# Patient Record
Sex: Male | Born: 1950 | Race: White | Hispanic: No | Marital: Married | Smoking: Never smoker
Health system: Southern US, Community
[De-identification: ages and names within clinical notes are randomized; demographics above are authoritative.]

## PROBLEM LIST (undated history)

## (undated) DIAGNOSIS — F329 Major depressive disorder, single episode, unspecified: Secondary | ICD-10-CM

## (undated) DIAGNOSIS — E785 Hyperlipidemia, unspecified: Secondary | ICD-10-CM

## (undated) DIAGNOSIS — F909 Attention-deficit hyperactivity disorder, unspecified type: Secondary | ICD-10-CM

## (undated) DIAGNOSIS — I2510 Atherosclerotic heart disease of native coronary artery without angina pectoris: Secondary | ICD-10-CM

## (undated) DIAGNOSIS — I209 Angina pectoris, unspecified: Secondary | ICD-10-CM

## (undated) DIAGNOSIS — G250 Essential tremor: Secondary | ICD-10-CM

## (undated) DIAGNOSIS — Z87828 Personal history of other (healed) physical injury and trauma: Secondary | ICD-10-CM

## (undated) HISTORY — PX: KNEE SURGERY: SHX244

## (undated) HISTORY — DX: Angina pectoris, unspecified: I20.9

## (undated) HISTORY — PX: ANKLE SURGERY: SHX546

## (undated) HISTORY — PX: TONSILLECTOMY: SUR1361

---

## 2009-05-09 ENCOUNTER — Encounter: Admission: RE | Admit: 2009-05-09 | Discharge: 2009-05-09 | Payer: Self-pay | Admitting: Family Medicine

## 2010-09-06 DIAGNOSIS — Z87828 Personal history of other (healed) physical injury and trauma: Secondary | ICD-10-CM | POA: Insufficient documentation

## 2010-09-06 HISTORY — DX: Personal history of other (healed) physical injury and trauma: Z87.828

## 2011-02-18 ENCOUNTER — Emergency Department (HOSPITAL_COMMUNITY): Payer: BC Managed Care – PPO

## 2011-02-18 ENCOUNTER — Inpatient Hospital Stay (HOSPITAL_COMMUNITY)
Admission: EM | Admit: 2011-02-18 | Discharge: 2011-02-22 | DRG: 223 | Disposition: A | Discharge status: Home / Self Care | Payer: BC Managed Care – PPO | Attending: General Surgery | Admitting: General Surgery

## 2011-02-18 DIAGNOSIS — S02401A Maxillary fracture, unspecified, initial encounter for closed fracture: Secondary | ICD-10-CM | POA: Diagnosis present

## 2011-02-18 DIAGNOSIS — H8110 Benign paroxysmal vertigo, unspecified ear: Secondary | ICD-10-CM | POA: Diagnosis not present

## 2011-02-18 DIAGNOSIS — F329 Major depressive disorder, single episode, unspecified: Secondary | ICD-10-CM | POA: Diagnosis present

## 2011-02-18 DIAGNOSIS — R259 Unspecified abnormal involuntary movements: Secondary | ICD-10-CM | POA: Diagnosis present

## 2011-02-18 DIAGNOSIS — F3289 Other specified depressive episodes: Secondary | ICD-10-CM | POA: Diagnosis present

## 2011-02-18 DIAGNOSIS — R339 Retention of urine, unspecified: Secondary | ICD-10-CM | POA: Diagnosis not present

## 2011-02-18 DIAGNOSIS — Y999 Unspecified external cause status: Secondary | ICD-10-CM

## 2011-02-18 DIAGNOSIS — S02109A Fracture of base of skull, unspecified side, initial encounter for closed fracture: Secondary | ICD-10-CM | POA: Diagnosis present

## 2011-02-18 DIAGNOSIS — Y9355 Activity, bike riding: Secondary | ICD-10-CM

## 2011-02-18 DIAGNOSIS — S52023A Displaced fracture of olecranon process without intraarticular extension of unspecified ulna, initial encounter for closed fracture: Principal | ICD-10-CM | POA: Diagnosis present

## 2011-02-18 DIAGNOSIS — S065XAA Traumatic subdural hemorrhage with loss of consciousness status unknown, initial encounter: Secondary | ICD-10-CM | POA: Diagnosis present

## 2011-02-18 DIAGNOSIS — Y92410 Unspecified street and highway as the place of occurrence of the external cause: Secondary | ICD-10-CM

## 2011-02-18 DIAGNOSIS — IMO0002 Reserved for concepts with insufficient information to code with codable children: Secondary | ICD-10-CM | POA: Diagnosis present

## 2011-02-18 DIAGNOSIS — S02400A Malar fracture unspecified, initial encounter for closed fracture: Secondary | ICD-10-CM | POA: Diagnosis present

## 2011-02-18 LAB — URINALYSIS, ROUTINE W REFLEX MICROSCOPIC
Bilirubin Urine: NEGATIVE
Glucose, UA: NEGATIVE mg/dL
Hgb urine dipstick: NEGATIVE
Ketones, ur: 15 mg/dL — AB
Leukocytes, UA: NEGATIVE
Nitrite: NEGATIVE
Protein, ur: NEGATIVE mg/dL
Specific Gravity, Urine: 1.015 (ref 1.005–1.030)
Urobilinogen, UA: 0.2 mg/dL (ref 0.0–1.0)
pH: 7 (ref 5.0–8.0)

## 2011-02-18 LAB — CBC
HCT: 43.9 % (ref 39.0–52.0)
Hemoglobin: 15.7 g/dL (ref 13.0–17.0)
MCH: 31.9 pg (ref 26.0–34.0)
MCHC: 35.8 g/dL (ref 30.0–36.0)
MCV: 89.2 fL (ref 78.0–100.0)
Platelets: 218 10*3/uL (ref 150–400)
RBC: 4.92 MIL/uL (ref 4.22–5.81)
RDW: 12.7 % (ref 11.5–15.5)
WBC: 10.6 10*3/uL — ABNORMAL HIGH (ref 4.0–10.5)

## 2011-02-18 LAB — PROTIME-INR
INR: 0.96 (ref 0.00–1.49)
Prothrombin Time: 13 seconds (ref 11.6–15.2)

## 2011-02-18 LAB — MRSA PCR SCREENING: MRSA by PCR: NEGATIVE

## 2011-02-18 LAB — DIFFERENTIAL
Basophils Absolute: 0 10*3/uL (ref 0.0–0.1)
Basophils Relative: 0 % (ref 0–1)
Eosinophils Absolute: 0.3 10*3/uL (ref 0.0–0.7)
Eosinophils Relative: 3 % (ref 0–5)
Lymphocytes Relative: 28 % (ref 12–46)
Lymphs Abs: 2.9 10*3/uL (ref 0.7–4.0)
Monocytes Absolute: 0.9 10*3/uL (ref 0.1–1.0)
Monocytes Relative: 9 % (ref 3–12)
Neutro Abs: 6.4 10*3/uL (ref 1.7–7.7)
Neutrophils Relative %: 61 % (ref 43–77)

## 2011-02-18 LAB — RAPID URINE DRUG SCREEN, HOSP PERFORMED
Amphetamines: POSITIVE — AB
Barbiturates: NOT DETECTED
Benzodiazepines: NOT DETECTED
Cocaine: NOT DETECTED
Opiates: NOT DETECTED
Tetrahydrocannabinol: NOT DETECTED

## 2011-02-18 LAB — APTT: aPTT: 28 seconds (ref 24–37)

## 2011-02-18 LAB — BASIC METABOLIC PANEL
BUN: 23 mg/dL (ref 6–23)
CO2: 24 mEq/L (ref 19–32)
Calcium: 9.8 mg/dL (ref 8.4–10.5)
Chloride: 100 mEq/L (ref 96–112)
Creatinine, Ser: 0.9 mg/dL (ref 0.4–1.5)
GFR calc Af Amer: >60 mL/min (ref >60)
GFR calc non Af Amer: >60 mL/min (ref >60)
Glucose, Bld: 126 mg/dL — ABNORMAL HIGH (ref 70–99)
Potassium: 4 mEq/L (ref 3.5–5.1)
Sodium: 136 mEq/L (ref 135–145)

## 2011-02-18 LAB — ETHANOL: Alcohol, Ethyl (B): <11 mg/dL — ABNORMAL HIGH (ref 0–10)

## 2011-02-18 MED ORDER — IOHEXOL 300 MG/ML  SOLN
80.0000 mL | Freq: Once | INTRAMUSCULAR | Status: AC | PRN
  Administered 2011-02-18: 80 mL via INTRAVENOUS

## 2011-02-19 ENCOUNTER — Inpatient Hospital Stay (HOSPITAL_COMMUNITY): Payer: BC Managed Care – PPO

## 2011-02-19 LAB — BASIC METABOLIC PANEL
BUN: 26 mg/dL — ABNORMAL HIGH (ref 6–23)
CO2: 26 mEq/L (ref 19–32)
Calcium: 8.5 mg/dL (ref 8.4–10.5)
Chloride: 103 mEq/L (ref 96–112)
Creatinine, Ser: 0.75 mg/dL (ref 0.50–1.35)
GFR calc Af Amer: >60 mL/min (ref >60)
GFR calc non Af Amer: >60 mL/min (ref >60)
Glucose, Bld: 115 mg/dL — ABNORMAL HIGH (ref 70–99)
Potassium: 4.1 mEq/L (ref 3.5–5.1)
Sodium: 138 mEq/L (ref 135–145)

## 2011-02-19 LAB — CBC
HCT: 42.4 % (ref 39.0–52.0)
Hemoglobin: 14.8 g/dL (ref 13.0–17.0)
MCH: 31.5 pg (ref 26.0–34.0)
MCHC: 34.9 g/dL (ref 30.0–36.0)
MCV: 90.2 fL (ref 78.0–100.0)
Platelets: 194 10*3/uL (ref 150–400)
RBC: 4.7 MIL/uL (ref 4.22–5.81)
RDW: 12.7 % (ref 11.5–15.5)
WBC: 13.8 10*3/uL — ABNORMAL HIGH (ref 4.0–10.5)
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
## 2011-02-20 LAB — BASIC METABOLIC PANEL
BUN: 20 mg/dL (ref 6–23)
CO2: 27 mEq/L (ref 19–32)
Calcium: 8 mg/dL — ABNORMAL LOW (ref 8.4–10.5)
Chloride: 100 mEq/L (ref 96–112)
Creatinine, Ser: 0.78 mg/dL (ref 0.50–1.35)
GFR calc Af Amer: >60 mL/min (ref >60)
GFR calc non Af Amer: >60 mL/min (ref >60)
Glucose, Bld: 110 mg/dL — ABNORMAL HIGH (ref 70–99)
Potassium: 3.5 mEq/L (ref 3.5–5.1)
Sodium: 135 mEq/L (ref 135–145)

## 2011-02-20 LAB — CBC
HCT: 40.2 % (ref 39.0–52.0)
Hemoglobin: 14 g/dL (ref 13.0–17.0)
MCH: 31.9 pg (ref 26.0–34.0)
MCHC: 34.8 g/dL (ref 30.0–36.0)
MCV: 91.6 fL (ref 78.0–100.0)
Platelets: 158 10*3/uL (ref 150–400)
RBC: 4.39 MIL/uL (ref 4.22–5.81)
RDW: 12.9 % (ref 11.5–15.5)
WBC: 10.3 10*3/uL (ref 4.0–10.5)
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
## 2011-03-01 ENCOUNTER — Ambulatory Visit: Payer: BC Managed Care – PPO | Attending: Orthopedic Surgery | Admitting: Occupational Therapy

## 2011-03-01 DIAGNOSIS — Z5189 Encounter for other specified aftercare: Secondary | ICD-10-CM | POA: Insufficient documentation

## 2011-03-01 DIAGNOSIS — M25629 Stiffness of unspecified elbow, not elsewhere classified: Secondary | ICD-10-CM | POA: Insufficient documentation

## 2011-03-01 DIAGNOSIS — R279 Unspecified lack of coordination: Secondary | ICD-10-CM | POA: Insufficient documentation

## 2011-03-01 DIAGNOSIS — M6281 Muscle weakness (generalized): Secondary | ICD-10-CM | POA: Insufficient documentation

## 2011-03-05 ENCOUNTER — Ambulatory Visit: Payer: BC Managed Care – PPO | Admitting: Physical Therapy

## 2011-03-09 ENCOUNTER — Ambulatory Visit: Payer: BC Managed Care – PPO | Attending: Physician Assistant

## 2011-03-09 DIAGNOSIS — M6281 Muscle weakness (generalized): Secondary | ICD-10-CM | POA: Insufficient documentation

## 2011-03-09 DIAGNOSIS — Z5189 Encounter for other specified aftercare: Secondary | ICD-10-CM | POA: Insufficient documentation

## 2011-03-09 DIAGNOSIS — M25629 Stiffness of unspecified elbow, not elsewhere classified: Secondary | ICD-10-CM | POA: Insufficient documentation

## 2011-03-09 DIAGNOSIS — R279 Unspecified lack of coordination: Secondary | ICD-10-CM | POA: Insufficient documentation
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
## 2011-03-17 ENCOUNTER — Ambulatory Visit: Payer: BC Managed Care – PPO | Admitting: Physical Therapy
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
## 2011-03-19 ENCOUNTER — Ambulatory Visit: Payer: BC Managed Care – PPO | Admitting: *Deleted

## 2011-03-22 ENCOUNTER — Ambulatory Visit: Payer: BC Managed Care – PPO | Admitting: Physical Therapy

## 2011-03-24 ENCOUNTER — Ambulatory Visit: Payer: BC Managed Care – PPO | Admitting: Occupational Therapy

## 2011-03-25 ENCOUNTER — Encounter: Payer: BC Managed Care – PPO | Admitting: Physical Therapy
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
## 2011-03-29 ENCOUNTER — Ambulatory Visit: Payer: BC Managed Care – PPO | Admitting: Physical Therapy

## 2011-03-29 ENCOUNTER — Ambulatory Visit: Payer: BC Managed Care – PPO | Admitting: Occupational Therapy

## 2011-04-01 ENCOUNTER — Ambulatory Visit: Payer: BC Managed Care – PPO | Admitting: Occupational Therapy

## 2011-04-01 ENCOUNTER — Ambulatory Visit: Payer: BC Managed Care – PPO | Admitting: Physical Therapy

## 2011-04-05 ENCOUNTER — Encounter: Payer: BC Managed Care – PPO | Admitting: Occupational Therapy

## 2011-04-05 ENCOUNTER — Encounter: Payer: BC Managed Care – PPO | Admitting: Physical Therapy

## 2011-04-08 ENCOUNTER — Encounter: Payer: BC Managed Care – PPO | Admitting: Occupational Therapy

## 2011-04-08 ENCOUNTER — Encounter: Payer: BC Managed Care – PPO | Admitting: Physical Therapy

## 2011-04-14 ENCOUNTER — Encounter: Payer: BC Managed Care – PPO | Admitting: Occupational Therapy

## 2011-04-14 ENCOUNTER — Encounter: Payer: BC Managed Care – PPO | Admitting: Physical Therapy

## 2011-04-16 ENCOUNTER — Encounter: Payer: BC Managed Care – PPO | Admitting: Physical Therapy

## 2011-04-16 ENCOUNTER — Encounter: Payer: BC Managed Care – PPO | Admitting: Occupational Therapy

## 2013-08-21 ENCOUNTER — Ambulatory Visit (INDEPENDENT_AMBULATORY_CARE_PROVIDER_SITE_OTHER): Payer: BC Managed Care – PPO | Admitting: Urology

## 2013-08-21 DIAGNOSIS — N529 Male erectile dysfunction, unspecified: Secondary | ICD-10-CM

## 2013-08-21 DIAGNOSIS — E291 Testicular hypofunction: Secondary | ICD-10-CM

## 2015-07-24 DIAGNOSIS — E785 Hyperlipidemia, unspecified: Secondary | ICD-10-CM | POA: Insufficient documentation

## 2015-07-24 HISTORY — DX: Hyperlipidemia, unspecified: E78.5

## 2015-09-09 ENCOUNTER — Ambulatory Visit (INDEPENDENT_AMBULATORY_CARE_PROVIDER_SITE_OTHER): Payer: BC Managed Care – PPO | Admitting: Urology

## 2015-09-09 DIAGNOSIS — N5201 Erectile dysfunction due to arterial insufficiency: Secondary | ICD-10-CM

## 2015-09-09 DIAGNOSIS — E291 Testicular hypofunction: Secondary | ICD-10-CM | POA: Diagnosis not present

## 2015-09-09 DIAGNOSIS — Z125 Encounter for screening for malignant neoplasm of prostate: Secondary | ICD-10-CM

## 2016-01-22 DIAGNOSIS — F411 Generalized anxiety disorder: Secondary | ICD-10-CM | POA: Insufficient documentation

## 2016-01-22 HISTORY — DX: Generalized anxiety disorder: F41.1

## 2016-06-30 DIAGNOSIS — R413 Other amnesia: Secondary | ICD-10-CM | POA: Insufficient documentation

## 2016-06-30 DIAGNOSIS — F0781 Postconcussional syndrome: Secondary | ICD-10-CM | POA: Insufficient documentation

## 2016-06-30 DIAGNOSIS — G44309 Post-traumatic headache, unspecified, not intractable: Secondary | ICD-10-CM

## 2016-06-30 DIAGNOSIS — F329 Major depressive disorder, single episode, unspecified: Secondary | ICD-10-CM | POA: Insufficient documentation

## 2016-06-30 DIAGNOSIS — F32A Depression, unspecified: Secondary | ICD-10-CM

## 2016-06-30 DIAGNOSIS — Z87828 Personal history of other (healed) physical injury and trauma: Secondary | ICD-10-CM | POA: Insufficient documentation

## 2016-06-30 DIAGNOSIS — G250 Essential tremor: Secondary | ICD-10-CM | POA: Insufficient documentation

## 2016-06-30 DIAGNOSIS — F341 Dysthymic disorder: Secondary | ICD-10-CM | POA: Insufficient documentation

## 2016-06-30 HISTORY — DX: Postconcussional syndrome: F07.81

## 2016-06-30 HISTORY — DX: Dysthymic disorder: F34.1

## 2016-06-30 HISTORY — DX: Depression, unspecified: F32.A

## 2016-06-30 HISTORY — DX: Essential tremor: G25.0

## 2016-06-30 HISTORY — DX: Other amnesia: R41.3

## 2016-06-30 HISTORY — DX: Post-traumatic headache, unspecified, not intractable: G44.309

## 2018-01-31 ENCOUNTER — Ambulatory Visit: Payer: Medicare Other | Admitting: Urology

## 2018-01-31 DIAGNOSIS — N5201 Erectile dysfunction due to arterial insufficiency: Secondary | ICD-10-CM

## 2018-01-31 DIAGNOSIS — E291 Testicular hypofunction: Secondary | ICD-10-CM | POA: Diagnosis not present

## 2018-02-27 ENCOUNTER — Ambulatory Visit: Payer: Medicare Other | Admitting: Cardiovascular Disease

## 2018-02-28 ENCOUNTER — Other Ambulatory Visit: Payer: Self-pay | Admitting: Cardiology

## 2018-02-28 ENCOUNTER — Ambulatory Visit
Admission: RE | Admit: 2018-02-28 | Discharge: 2018-02-28 | Disposition: A | Discharge status: Home / Self Care | Payer: Medicare Other | Source: Ambulatory Visit | Attending: Cardiology | Admitting: Cardiology

## 2018-02-28 ENCOUNTER — Encounter: Payer: Self-pay | Admitting: Cardiology

## 2018-02-28 DIAGNOSIS — R079 Chest pain, unspecified: Secondary | ICD-10-CM

## 2018-02-28 NOTE — H&P (Signed)
Zachary Ponce    Date of visit:  02/28/2018 DOB:  1951/04/21    Age:  67 yrs. Medical record number:  [id number]     Account number:  [id number] Primary Care Provider: Ernestine Conrad ____________________________ CURRENT DIAGNOSES  1. Chest pain, unspecified  2. Abnormal result of cardiovascular function study, unspecified  3. Encounter for preprocedural cardiovascular examination  4. Hyperlipidemia ____________________________ ALLERGIES  No Known Drug Allergies ____________________________ MEDICATIONS  1. AndroGel 20.25 mg/1.25 gram (1.62 %) transdermal gel pump, Daily  2. bupropion HCl XL 300 mg 24 hr tablet, extended release, 1 p.o. daily  3. escitalopram 5 mg tablet, 1 p.o. daily  4. Malarone 250 mg-100 mg tablet, Take as directed PRN  5. propranolol ER 120 mg capsule,24 hr,extended release, 1 p.o. daily  6. Vyvanse 30 mg capsule, 1 p.o. daily ____________________________ HISTORY OF PRESENT ILLNESS This very nice 67 year old male is seen at the request of Dr. Kenton Kingfisher for evaluation of fatigue and dyspnea. The patient has a history of ADHD and some mild depression as well as hyperlipidemia. Most recently noticed that he had some dyspnea when walking up inclines or having to walk the golf course to the point where he had to start using the cart. He may have had some vague chest tightness. He spends his time between [place] state as well as here and saw his primary care physician out there who recommended that he have a stress test. He has noted these vague symptoms over the past several months. He does not have symptoms at rest and denies edema. He has no PND, orthopnea, syncope, palpitations, or claudication.  He had a stress test done on 6/25 and only walked 6 minutes and 45 seconds and stopped as of dyspnea and had typical angina. He had 1 mm of ST depression laterally associated with T-wave changes in recovery. He had a clinically and EKG positive test for angina and ischemia  and cardiac catheterization was recommended. ____________________________ PAST HISTORY  Past Medical Illnesses:  hyperlipidemia, anxiety, colon polyps, ADHD, essential tremor;  Cardiovascular Illnesses:  no previous history of cardiac disease;  Infectious Diseases:  no previous history of significant infectious diseases;  Surgical Procedures:  knee surgery, rt, tonsillectomy, rt ear drum implant, rt ankle surgery;  Trauma History:  no previous history of significant trauma;  NYHA Classification:  I;  Canadian Angina Classification:  Class 0: Asymptomatic;  Cardiology Procedures-Invasive:  no previous interventional or invasive cardiology procedures;  Cardiology Procedures-Noninvasive:  no previous non-invasive cardiovascular testing;  Peripheral Vascular Procedures:  no previous invasive peripheral vascular procedures.;  LVEF not documented,   ____________________________ CARDIO-PULMONARY TEST DATES EKG Date:  02/21/2018;   ____________________________ FAMILY HISTORY Brother -- Diabetes mellitus, Brother alive with problem Father -- Father dead Mother -- Mother dead, COPD, Malignant neoplasm of lung ____________________________ SOCIAL HISTORY Alcohol Use:  does not use alcohol;  Smoking:  never smoked;  Diet:  regular diet;  Lifestyle:  married;  Exercise:  golf;  Occupation:  retired;  Residence:  lives with wife;   ____________________________ REVIEW OF SYSTEMS General:  denies recent weight change, fatique or change in exercise tolerance.  Integumentary:no rashes or new skin lesions. Eyes: wears eye glasses/contact lenses, denies diplopia, glaucoma or visual field defects. Ears, Nose, Throat, Mouth:  denies any hearing loss, epistaxis, hoarseness or difficulty speaking. Respiratory: mild dyspnea with exertion Cardiovascular:  please review HPI Abdominal: denies dyspepsia, GI bleeding, constipation, or diarrhea Genitourinary-Male: hesitancy, nocturia  Musculoskeletal:  mild chronic back pain  Neurological:  denies headaches, stroke, or TIA Psychiatric:  depression Hematological/Immunologic:  denies any food allergies, bleeding disorders. ____________________________ PHYSICAL EXAMINATION VITAL SIGNS  Blood Pressure:  120/80 Standing, Left arm, regular cuff   Pulse:  80/min. Weight:  178.00 lbs. Height:  70.00"BMI: 25  Constitutional:  pleasant white male in no acute distress Skin:  warm and dry to touch, no apparent skin lesions, or masses noted. Head:  normocephalic, normal hair pattern, no masses or tenderness Eyes:  EOMS Intact, PERRLA, C and S clear, Funduscopic exam not done. ENT:  ears, nose and throat reveal no gross abnormalities.  Dentition good. Neck:  supple, without massess. No JVD, thyromegaly or carotid bruits. Carotid upstroke normal. Chest:  normal symmetry, clear to auscultation. Cardiac:  regular rhythm, normal S1 and S2, No S3 or S4, no murmurs, gallops or rubs detected. Abdomen:  abdomen soft,non-tender, no masses, no hepatospenomegaly, or aneurysm noted Peripheral Pulses:  the femoral,dorsalis pedis, and posterior tibial pulses are full and equal bilaterally with no bruits auscultated. Extremities & Back:  no deformities, clubbing, cyanosis, erythema or edema observed. Normal muscle strength and tone. Neurological:  no gross motor or sensory deficits noted, affect appropriate, oriented x3. ____________________________ MOST RECENT LIPID PANEL 01/18/18  CHOL TOTL 209 mg/dl, LDL 138 NM, HDL 32 mg/dl, TRIGLYCER 193 mg/dl, ALT 21 u/l, ALK PHOS 121 u/l, CHOL/HDL 6.5 (Calc), AST 23 u/l and VLDL 39 ____________________________ IMPRESSIONS/PLAN  He had a stress test done on 6/25 and only walked 6 minutes and 45 seconds and stopped as of dyspnea and had typical angina. He had 1 mm of ST depression laterally associated with T-wave changes in recovery. He had a clinically and EKG positive test for angina and ischemia and cardiac catheterization was recommended.  1.  Chest discomfort and dyspnea suggestive of angina with an abnormal cardiovascular stress test 2. Hyperlipidemia 3. History of ADHD and depression 4. Essential tremor  Recommendations:  The patient has a suggestive clinical history of angina with an abnormal exercise treadmill test only one minute into Bruce stage III. I recommended he undergo cardiac catheterization. Cardiac catheterization was discussed with the patient and wife including risks of myocardial infarction, death, stroke, bleeding, arrhythmia, dye allergy, or renal insufficiency. He understands and is willing to proceed. Possibility of percutaneous intervention at the same setting was also discussed with the patient including risks.  ____________________________ TODAYS ORDERS  1. Draw PT/INR: Today  2. Comprehensive Metabolic Panel: Today  3. Complete Blood Count: Today  4. PTT: Today  5. Chest X-ray PA/Lat: today                       ____________________________ Cardiology Physician:  Kerry Hough MD [facility]

## 2018-03-02 ENCOUNTER — Other Ambulatory Visit: Payer: Self-pay | Admitting: Cardiology

## 2018-03-03 ENCOUNTER — Encounter (HOSPITAL_COMMUNITY)
Admission: AD | Disposition: A | Discharge status: Home Health | Payer: Self-pay | Source: Home / Self Care | Attending: Cardiology

## 2018-03-03 ENCOUNTER — Encounter (HOSPITAL_COMMUNITY): Payer: Self-pay | Admitting: Cardiology

## 2018-03-03 ENCOUNTER — Other Ambulatory Visit: Payer: Self-pay | Admitting: *Deleted

## 2018-03-03 ENCOUNTER — Other Ambulatory Visit: Payer: Self-pay

## 2018-03-03 ENCOUNTER — Inpatient Hospital Stay (HOSPITAL_COMMUNITY)
Admission: AD | Admit: 2018-03-03 | Discharge: 2018-03-16 | DRG: 234 | Disposition: A | Discharge status: Home Health | Payer: Medicare Other | Attending: Cardiology | Admitting: Cardiology

## 2018-03-03 DIAGNOSIS — F329 Major depressive disorder, single episode, unspecified: Secondary | ICD-10-CM | POA: Diagnosis present

## 2018-03-03 DIAGNOSIS — I480 Paroxysmal atrial fibrillation: Secondary | ICD-10-CM | POA: Diagnosis not present

## 2018-03-03 DIAGNOSIS — I25119 Atherosclerotic heart disease of native coronary artery with unspecified angina pectoris: Secondary | ICD-10-CM | POA: Diagnosis not present

## 2018-03-03 DIAGNOSIS — Z8249 Family history of ischemic heart disease and other diseases of the circulatory system: Secondary | ICD-10-CM

## 2018-03-03 DIAGNOSIS — G4700 Insomnia, unspecified: Secondary | ICD-10-CM | POA: Diagnosis not present

## 2018-03-03 DIAGNOSIS — I308 Other forms of acute pericarditis: Secondary | ICD-10-CM | POA: Diagnosis not present

## 2018-03-03 DIAGNOSIS — E8770 Fluid overload, unspecified: Secondary | ICD-10-CM | POA: Diagnosis not present

## 2018-03-03 DIAGNOSIS — D62 Acute posthemorrhagic anemia: Secondary | ICD-10-CM | POA: Diagnosis not present

## 2018-03-03 DIAGNOSIS — I319 Disease of pericardium, unspecified: Secondary | ICD-10-CM | POA: Diagnosis not present

## 2018-03-03 DIAGNOSIS — I25118 Atherosclerotic heart disease of native coronary artery with other forms of angina pectoris: Secondary | ICD-10-CM | POA: Diagnosis not present

## 2018-03-03 DIAGNOSIS — I440 Atrioventricular block, first degree: Secondary | ICD-10-CM | POA: Diagnosis present

## 2018-03-03 DIAGNOSIS — I9789 Other postprocedural complications and disorders of the circulatory system, not elsewhere classified: Secondary | ICD-10-CM | POA: Diagnosis not present

## 2018-03-03 DIAGNOSIS — Z9889 Other specified postprocedural states: Secondary | ICD-10-CM

## 2018-03-03 DIAGNOSIS — Z01810 Encounter for preprocedural cardiovascular examination: Secondary | ICD-10-CM | POA: Diagnosis not present

## 2018-03-03 DIAGNOSIS — I739 Peripheral vascular disease, unspecified: Secondary | ICD-10-CM | POA: Diagnosis present

## 2018-03-03 DIAGNOSIS — I951 Orthostatic hypotension: Secondary | ICD-10-CM | POA: Diagnosis not present

## 2018-03-03 DIAGNOSIS — I208 Other forms of angina pectoris: Secondary | ICD-10-CM | POA: Diagnosis not present

## 2018-03-03 DIAGNOSIS — E785 Hyperlipidemia, unspecified: Secondary | ICD-10-CM

## 2018-03-03 DIAGNOSIS — I779 Disorder of arteries and arterioles, unspecified: Secondary | ICD-10-CM

## 2018-03-03 DIAGNOSIS — I2510 Atherosclerotic heart disease of native coronary artery without angina pectoris: Secondary | ICD-10-CM

## 2018-03-03 DIAGNOSIS — I25110 Atherosclerotic heart disease of native coronary artery with unstable angina pectoris: Principal | ICD-10-CM | POA: Diagnosis present

## 2018-03-03 DIAGNOSIS — Z951 Presence of aortocoronary bypass graft: Secondary | ICD-10-CM | POA: Diagnosis not present

## 2018-03-03 DIAGNOSIS — R0609 Other forms of dyspnea: Secondary | ICD-10-CM | POA: Diagnosis present

## 2018-03-03 DIAGNOSIS — I4891 Unspecified atrial fibrillation: Secondary | ICD-10-CM | POA: Diagnosis not present

## 2018-03-03 DIAGNOSIS — I10 Essential (primary) hypertension: Secondary | ICD-10-CM | POA: Diagnosis present

## 2018-03-03 DIAGNOSIS — I6523 Occlusion and stenosis of bilateral carotid arteries: Secondary | ICD-10-CM | POA: Diagnosis present

## 2018-03-03 DIAGNOSIS — F909 Attention-deficit hyperactivity disorder, unspecified type: Secondary | ICD-10-CM | POA: Diagnosis present

## 2018-03-03 DIAGNOSIS — I300 Acute nonspecific idiopathic pericarditis: Secondary | ICD-10-CM | POA: Diagnosis not present

## 2018-03-03 DIAGNOSIS — I2584 Coronary atherosclerosis due to calcified coronary lesion: Secondary | ICD-10-CM | POA: Diagnosis present

## 2018-03-03 DIAGNOSIS — I361 Nonrheumatic tricuspid (valve) insufficiency: Secondary | ICD-10-CM | POA: Diagnosis not present

## 2018-03-03 DIAGNOSIS — R9439 Abnormal result of other cardiovascular function study: Secondary | ICD-10-CM | POA: Diagnosis present

## 2018-03-03 DIAGNOSIS — I209 Angina pectoris, unspecified: Secondary | ICD-10-CM | POA: Diagnosis present

## 2018-03-03 HISTORY — DX: Major depressive disorder, single episode, unspecified: F32.9

## 2018-03-03 HISTORY — DX: Attention-deficit hyperactivity disorder, unspecified type: F90.9

## 2018-03-03 HISTORY — PX: LEFT HEART CATH AND CORONARY ANGIOGRAPHY: CATH118249

## 2018-03-03 HISTORY — DX: Hyperlipidemia, unspecified: E78.5

## 2018-03-03 HISTORY — DX: Atherosclerotic heart disease of native coronary artery without angina pectoris: I25.10

## 2018-03-03 HISTORY — DX: Essential tremor: G25.0

## 2018-03-03 HISTORY — DX: Personal history of other (healed) physical injury and trauma: Z87.828

## 2018-03-03 LAB — CBC
HCT: 43.6 % (ref 39.0–52.0)
Hemoglobin: 13.9 g/dL (ref 13.0–17.0)
MCH: 31.1 pg (ref 26.0–34.0)
MCHC: 31.9 g/dL (ref 30.0–36.0)
MCV: 97.5 fL (ref 78.0–100.0)
Platelets: 177 10*3/uL (ref 150–400)
RBC: 4.47 MIL/uL (ref 4.22–5.81)
RDW: 12.8 % (ref 11.5–15.5)
WBC: 4.6 10*3/uL (ref 4.0–10.5)

## 2018-03-03 SURGERY — LEFT HEART CATH AND CORONARY ANGIOGRAPHY
Anesthesia: LOCAL

## 2018-03-03 MED ORDER — VERAPAMIL HCL 2.5 MG/ML IV SOLN
INTRAVENOUS | Status: DC | PRN
  Administered 2018-03-03: 13:00:00 via INTRA_ARTERIAL

## 2018-03-03 MED ORDER — SODIUM CHLORIDE 0.9 % IV SOLN: 250.0000 mL | INTRAVENOUS | Status: DC | PRN

## 2018-03-03 MED ORDER — SODIUM CHLORIDE 0.9% FLUSH: 3.0000 mL | INTRAVENOUS | Status: DC | PRN

## 2018-03-03 MED ORDER — ISOSORBIDE MONONITRATE ER 30 MG PO TB24
30.0000 mg | ORAL_TABLET | Freq: Every day | ORAL | Status: DC
  Administered 2018-03-03 – 2018-03-07 (×5): 30 mg via ORAL
  Filled 2018-03-03 (×5): qty 1

## 2018-03-03 MED ORDER — ESCITALOPRAM OXALATE 10 MG PO TABS
5.0000 mg | ORAL_TABLET | Freq: Every day | ORAL | Status: DC
  Administered 2018-03-03 – 2018-03-16 (×13): 5 mg via ORAL
  Filled 2018-03-03 (×13): qty 1

## 2018-03-03 MED ORDER — MIDAZOLAM HCL 2 MG/2ML IJ SOLN
INTRAMUSCULAR | Status: AC
  Filled 2018-03-03: qty 2

## 2018-03-03 MED ORDER — ATORVASTATIN CALCIUM 80 MG PO TABS
80.0000 mg | ORAL_TABLET | Freq: Every day | ORAL | Status: DC
  Administered 2018-03-03 – 2018-03-11 (×8): 80 mg via ORAL
  Filled 2018-03-03 (×8): qty 1

## 2018-03-03 MED ORDER — LIDOCAINE HCL (PF) 1 % IJ SOLN
INTRAMUSCULAR | Status: AC
  Filled 2018-03-03: qty 30

## 2018-03-03 MED ORDER — BUPROPION HCL ER (XL) 300 MG PO TB24
300.0000 mg | ORAL_TABLET | Freq: Every day | ORAL | Status: DC
  Administered 2018-03-03 – 2018-03-16 (×13): 300 mg via ORAL
  Filled 2018-03-03 (×6): qty 2
  Filled 2018-03-03: qty 1
  Filled 2018-03-03: qty 2
  Filled 2018-03-03 (×2): qty 1
  Filled 2018-03-03 (×2): qty 2
  Filled 2018-03-03: qty 1
  Filled 2018-03-03: qty 2

## 2018-03-03 MED ORDER — ACETAMINOPHEN 325 MG PO TABS: 650.0000 mg | ORAL_TABLET | ORAL | Status: DC | PRN

## 2018-03-03 MED ORDER — VERAPAMIL HCL 2.5 MG/ML IV SOLN
INTRAVENOUS | Status: AC
  Filled 2018-03-03: qty 2

## 2018-03-03 MED ORDER — HEPARIN SODIUM (PORCINE) 1000 UNIT/ML IJ SOLN
INTRAMUSCULAR | Status: AC
  Filled 2018-03-03: qty 1

## 2018-03-03 MED ORDER — ASPIRIN 81 MG PO CHEW: 81.0000 mg | CHEWABLE_TABLET | ORAL | Status: DC

## 2018-03-03 MED ORDER — DIAZEPAM 5 MG PO TABS: 5.0000 mg | ORAL_TABLET | Freq: Four times a day (QID) | ORAL | Status: DC | PRN

## 2018-03-03 MED ORDER — ASPIRIN 81 MG PO CHEW
81.0000 mg | CHEWABLE_TABLET | Freq: Every day | ORAL | Status: DC
  Administered 2018-03-04 – 2018-03-07 (×4): 81 mg via ORAL
  Filled 2018-03-03 (×4): qty 1

## 2018-03-03 MED ORDER — LIDOCAINE HCL (PF) 1 % IJ SOLN
INTRAMUSCULAR | Status: DC | PRN
  Administered 2018-03-03: 2 mL

## 2018-03-03 MED ORDER — SODIUM CHLORIDE 0.9 % WEIGHT BASED INFUSION: 1.0000 mL/kg/h | INTRAVENOUS | Status: DC

## 2018-03-03 MED ORDER — SODIUM CHLORIDE 0.9% FLUSH: 3.0000 mL | Freq: Two times a day (BID) | INTRAVENOUS | Status: DC

## 2018-03-03 MED ORDER — HEPARIN (PORCINE) IN NACL 100-0.45 UNIT/ML-% IJ SOLN
1050.0000 [IU]/h | INTRAMUSCULAR | Status: DC
  Administered 2018-03-03 – 2018-03-04 (×2): 1150 [IU]/h via INTRAVENOUS
  Administered 2018-03-05 – 2018-03-07 (×3): 1050 [IU]/h via INTRAVENOUS
  Filled 2018-03-03 (×5): qty 250

## 2018-03-03 MED ORDER — FENTANYL CITRATE (PF) 100 MCG/2ML IJ SOLN
INTRAMUSCULAR | Status: AC
  Filled 2018-03-03: qty 2

## 2018-03-03 MED ORDER — SODIUM CHLORIDE 0.9 % IV SOLN
INTRAVENOUS | Status: DC
  Administered 2018-03-03: 18:00:00 via INTRAVENOUS

## 2018-03-03 MED ORDER — FENTANYL CITRATE (PF) 100 MCG/2ML IJ SOLN
INTRAMUSCULAR | Status: DC | PRN
  Administered 2018-03-03: 25 ug via INTRAVENOUS

## 2018-03-03 MED ORDER — HEPARIN (PORCINE) IN NACL 2-0.9 UNITS/ML
INTRAMUSCULAR | Status: AC | PRN
  Administered 2018-03-03 (×2): 500 mL via INTRA_ARTERIAL

## 2018-03-03 MED ORDER — SODIUM CHLORIDE 0.9 % IV SOLN
250.0000 mL | INTRAVENOUS | Status: DC | PRN
  Administered 2018-03-04: 250 mL via INTRAVENOUS

## 2018-03-03 MED ORDER — SODIUM CHLORIDE 0.9 % WEIGHT BASED INFUSION
3.0000 mL/kg/h | INTRAVENOUS | Status: DC
  Administered 2018-03-03: 3 mL/kg/h via INTRAVENOUS

## 2018-03-03 MED ORDER — HEPARIN (PORCINE) IN NACL 1000-0.9 UT/500ML-% IV SOLN
INTRAVENOUS | Status: AC
  Filled 2018-03-03: qty 1000

## 2018-03-03 MED ORDER — HEPARIN SODIUM (PORCINE) 1000 UNIT/ML IJ SOLN
INTRAMUSCULAR | Status: DC | PRN
  Administered 2018-03-03: 4000 [IU] via INTRAVENOUS

## 2018-03-03 MED ORDER — LISDEXAMFETAMINE DIMESYLATE 30 MG PO CAPS
30.0000 mg | ORAL_CAPSULE | Freq: Every day | ORAL | Status: DC
  Administered 2018-03-04 – 2018-03-16 (×11): 30 mg via ORAL
  Filled 2018-03-03 (×12): qty 1

## 2018-03-03 MED ORDER — MIDAZOLAM HCL 2 MG/2ML IJ SOLN
INTRAMUSCULAR | Status: DC | PRN
  Administered 2018-03-03: 2 mg via INTRAVENOUS

## 2018-03-03 MED ORDER — ONDANSETRON HCL 4 MG/2ML IJ SOLN
4.0000 mg | Freq: Four times a day (QID) | INTRAMUSCULAR | Status: DC | PRN
  Administered 2018-03-08: 4 mg via INTRAVENOUS

## 2018-03-03 MED ORDER — SODIUM CHLORIDE 0.9% FLUSH
3.0000 mL | Freq: Two times a day (BID) | INTRAVENOUS | Status: DC
  Administered 2018-03-04 – 2018-03-07 (×5): 3 mL via INTRAVENOUS

## 2018-03-03 MED ORDER — PROPRANOLOL HCL ER 120 MG PO CP24
120.0000 mg | ORAL_CAPSULE | Freq: Every day | ORAL | Status: DC
  Administered 2018-03-03 – 2018-03-07 (×5): 120 mg via ORAL
  Filled 2018-03-03 (×6): qty 1

## 2018-03-03 MED ORDER — IOHEXOL 350 MG/ML SOLN
INTRAVENOUS | Status: DC | PRN
  Administered 2018-03-03: 110 mL via INTRA_ARTERIAL

## 2018-03-03 SURGICAL SUPPLY — 13 items
CATH INFINITI 5FR ANG PIGTAIL (CATHETERS) ×2 IMPLANT
CATH INFINITI JR4 5F (CATHETERS) ×2 IMPLANT
CATH OPTITORQUE TIG 4.0 5F (CATHETERS) ×2 IMPLANT
DEVICE RAD COMP TR BAND LRG (VASCULAR PRODUCTS) ×2 IMPLANT
GLIDESHEATH SLEND SS 6F .021 (SHEATH) ×2 IMPLANT
GUIDEWIRE INQWIRE 1.5J.035X260 (WIRE) ×1 IMPLANT
INQWIRE 1.5J .035X260CM (WIRE) ×2
KIT HEART LEFT (KITS) ×2 IMPLANT
PACK CARDIAC CATHETERIZATION (CUSTOM PROCEDURE TRAY) ×2 IMPLANT
SYR MEDRAD MARK V 150ML (SYRINGE) ×2 IMPLANT
TRANSDUCER W/STOPCOCK (MISCELLANEOUS) ×2 IMPLANT
TUBING CIL FLEX 10 FLL-RA (TUBING) ×2 IMPLANT
VALVE MANIFOLD 3 PORT W/RA/ON (MISCELLANEOUS) ×2 IMPLANT

## 2018-03-03 NOTE — Progress Notes (Signed)
ANTICOAGULATION CONSULT NOTE - Initial Consult  Pharmacy Consult for Heparin  Indication:  No Known Allergies  Patient Measurements: Height: 5\' 10"  (177.8 cm) Weight: 178 lb (80.7 kg) IBW/kg (Calculated) : 73 Heparin Dosing Weight: 80.7 kg  Vital Signs: Temp: 97.6 F (36.4 C) (06/28 0837) Temp Source: Oral (06/28 0837) BP: 163/74 (06/28 1334) Pulse Rate: 52 (06/28 1334)  Labs: No results for input(s): HGB, HCT, PLT, APTT, LABPROT, INR, HEPARINUNFRC, HEPRLOWMOCWT, CREATININE, CKTOTAL, CKMB, TROPONINI in the last 72 hours.  CrCl cannot be calculated (Patient's most recent lab result is older than the maximum 21 days allowed.).   Medical History: No past medical history on file.  Medications:  Scheduled:  . [START ON 03/04/2018] aspirin  81 mg Oral Daily  . atorvastatin  80 mg Oral q1800  . buPROPion  300 mg Oral Daily  . escitalopram  5 mg Oral Daily  . isosorbide mononitrate  30 mg Oral Daily  . [START ON 03/04/2018] lisdexamfetamine  30 mg Oral Daily  . propranolol ER  120 mg Oral Daily  . sodium chloride flush  3 mL Intravenous Q12H    Assessment:  67 y.o male with chest pain. PMH of  HLD, ADHD, anxiety.  Here today for cardiac cath. Now s/p cath, pharmacy consulted to start IV heparin 8 hours after sheath out.  Sheath out at 13:10, no bleeding or hematoma noted.   No CBC done.     Goal of Therapy:  Heparin level 0.3-0.7 units/ml Monitor platelets by anticoagulation protocol: Yes   Plan:  Check CBC now prior to start of IV heparin  At 21:15 tonight, start IV hepairn (no bolus) at  Check 6 hour heparin level Daily Heparin level and CBC Monitor for s/s/ of bleeding   Thank you for allowing pharmacy to be part of this patients care team. Noah Delaineuth Kachina Niederer, RPh Clinical Pharmacist Please check AMION for all [facility] numbers 03/03/2018,2:32 PM

## 2018-03-03 NOTE — H&P (Signed)
Update H&P: Agree with planned cath per Dr. York Spanielilley's note on Mr. Andy. I have reviewed the risks, indications, and alternatives to cardiac catheterization, possible angioplasty, and stenting with the patient. Risks include but are not limited to bleeding, infection, vascular injury, stroke, myocardial infection, arrhythmia, kidney injury, radiation-related injury in the case of prolonged fluoroscopy use, emergency cardiac surgery, and death. The patient understands the risks of serious complication is 1-2 in 1000 with diagnostic cardiac cath and 1-2% or less with angioplasty/stenting.  Nicki Guadalajarahomas Graceland Wachter, MD  03/03/2018  12:19 PM
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
## 2018-03-03 NOTE — Progress Notes (Signed)
Subjective:  Patient was seen by me with fatigue and angina.  He had a positive treadmill test a few days ago.  He had an episode of chest discomfort while walking up a hill yesterday while walking his dogs. Cardiac cath reviewed by me personally today shows distal left main disease a very tight proximal LAD stenosis and moderate circumflex disease ostial RCA disease.  He is admitted for consideration of bypass grafting.  Objective:  Vital Signs in the last 24 hours: BP (!) 163/74   Pulse (!) 52   Temp 97.6 F (36.4 C) (Oral)   Resp 15   Ht 5\' 10"  (1.778 m)   Wt 80.7 kg (178 lb)   SpO2 100%   BMI 25.54 kg/m   Physical Exam:  Lungs:  Clear  Cardiac:  Regular rhythm, normal S1 and S2, no S3 Extremities:  Radial cath site currently stable  Intake/Output from previous day: No intake/output data recorded. Weight Filed Weights   03/03/18 0837  Weight: 80.7 kg (178 lb)   Assessment/Plan:  1.  Significant left main and three-vessel disease with lower limits of normal LV function 2.  Hyperlipidemia 3.  History of ADHD  Recommendations:  He will have a cardiac surgery consultation.  I appreciate Dr. Landry DykeKelly's help today.  Discussed with patient.     [place], Jr.  MD [facility]  03/03/2018, 3:03 PM

## 2018-03-04 ENCOUNTER — Inpatient Hospital Stay (HOSPITAL_COMMUNITY): Payer: Medicare Other

## 2018-03-04 DIAGNOSIS — I361 Nonrheumatic tricuspid (valve) insufficiency: Secondary | ICD-10-CM

## 2018-03-04 DIAGNOSIS — I208 Other forms of angina pectoris: Secondary | ICD-10-CM

## 2018-03-04 LAB — URINALYSIS, ROUTINE W REFLEX MICROSCOPIC
Bilirubin Urine: NEGATIVE
Glucose, UA: NEGATIVE mg/dL
Hgb urine dipstick: NEGATIVE
Ketones, ur: NEGATIVE mg/dL
Leukocytes, UA: NEGATIVE
Nitrite: NEGATIVE
Protein, ur: NEGATIVE mg/dL
Specific Gravity, Urine: 1.014 (ref 1.005–1.030)
pH: 6 (ref 5.0–8.0)

## 2018-03-04 LAB — HEPATIC FUNCTION PANEL
ALT: 18 U/L (ref 0–44)
AST: 18 U/L (ref 15–41)
Albumin: 3.1 g/dL — ABNORMAL LOW (ref 3.5–5.0)
Alkaline Phosphatase: 82 U/L (ref 38–126)
Bilirubin, Direct: 0.1 mg/dL (ref 0.0–0.2)
Indirect Bilirubin: 0.8 mg/dL (ref 0.3–0.9)
Total Bilirubin: 0.9 mg/dL (ref 0.3–1.2)
Total Protein: 5.4 g/dL — ABNORMAL LOW (ref 6.5–8.1)

## 2018-03-04 LAB — BASIC METABOLIC PANEL
Anion gap: 4 — ABNORMAL LOW (ref 5–15)
BUN: 13 mg/dL (ref 8–23)
CO2: 29 mmol/L (ref 22–32)
Calcium: 8.4 mg/dL — ABNORMAL LOW (ref 8.9–10.3)
Chloride: 106 mmol/L (ref 98–111)
Creatinine, Ser: 0.95 mg/dL (ref 0.61–1.24)
GFR calc Af Amer: >60 mL/min (ref >60)
GFR calc non Af Amer: >60 mL/min (ref >60)
Glucose, Bld: 102 mg/dL — ABNORMAL HIGH (ref 70–99)
Potassium: 4.7 mmol/L (ref 3.5–5.1)
Sodium: 139 mmol/L (ref 135–145)

## 2018-03-04 LAB — CBC
HCT: 40.8 % (ref 39.0–52.0)
Hemoglobin: 13.2 g/dL (ref 13.0–17.0)
MCH: 31.4 pg (ref 26.0–34.0)
MCHC: 32.4 g/dL (ref 30.0–36.0)
MCV: 96.9 fL (ref 78.0–100.0)
Platelets: 168 10*3/uL (ref 150–400)
RBC: 4.21 MIL/uL — ABNORMAL LOW (ref 4.22–5.81)
RDW: 12.6 % (ref 11.5–15.5)
WBC: 7 10*3/uL (ref 4.0–10.5)

## 2018-03-04 LAB — SURGICAL PCR SCREEN
MRSA, PCR: NEGATIVE
Staphylococcus aureus: NEGATIVE

## 2018-03-04 LAB — HEPARIN LEVEL (UNFRACTIONATED)
Heparin Unfractionated: 0.47 IU/mL (ref 0.30–0.70)
Heparin Unfractionated: 0.75 IU/mL — ABNORMAL HIGH (ref 0.30–0.70)

## 2018-03-04 LAB — PROTIME-INR
INR: 1.07
Prothrombin Time: 13.8 seconds (ref 11.4–15.2)

## 2018-03-04 LAB — ECHOCARDIOGRAM COMPLETE
Height: 70 in
Weight: 2825.6 oz

## 2018-03-04 LAB — TSH: TSH: 0.853 u[IU]/mL (ref 0.350–4.500)

## 2018-03-04 MED ORDER — LISINOPRIL 2.5 MG PO TABS
2.5000 mg | ORAL_TABLET | Freq: Every day | ORAL | Status: DC
  Administered 2018-03-04 – 2018-03-07 (×4): 2.5 mg via ORAL
  Filled 2018-03-04 (×4): qty 1

## 2018-03-04 NOTE — Progress Notes (Signed)
ANTICOAGULATION CONSULT NOTE - Follow Up Consult  Pharmacy Consult for heparin Indication: CAD awaiting CABG  Labs: Recent Labs    03/03/18 1543 03/04/18 0509  HGB 13.9 13.2  HCT 43.6 40.8  PLT 177 168  LABPROT  --  13.8  INR  --  1.07  HEPARINUNFRC  --  0.47  CREATININE  --  0.95    Assessment/Plan:  67yo male therapeutic on heparin after started post-cath. Will continue gtt at current rate and confirm stable with additional level.   Vernard GamblesVeronda Andreas Sobolewski, PharmD, BCPS  03/04/2018,6:44 AM

## 2018-03-04 NOTE — Plan of Care (Signed)
  Problem: Education: Goal: Knowledge of General Education information will improve Outcome: Completed/Met   Problem: Activity: Goal: Risk for activity intolerance will decrease Outcome: Completed/Met   Problem: Nutrition: Goal: Adequate nutrition will be maintained Outcome: Completed/Met   Problem: Pain Managment: Goal: General experience of comfort will improve Outcome: Completed/Met

## 2018-03-04 NOTE — Progress Notes (Signed)
Progress Note  Patient Name: [place] Date of Encounter: 03/04/2018  Primary Cardiologist: Donnie Ahoilley   Subjective   No complaints  Inpatient Medications    Scheduled Meds: . aspirin  81 mg Oral Daily  . atorvastatin  80 mg Oral q1800  . buPROPion  300 mg Oral Daily  . escitalopram  5 mg Oral Daily  . isosorbide mononitrate  30 mg Oral Daily  . lisdexamfetamine  30 mg Oral Daily  . propranolol ER  120 mg Oral Daily  . sodium chloride flush  3 mL Intravenous Q12H   Continuous Infusions: . sodium chloride 100 mL/hr at 03/03/18 1759  . sodium chloride 250 mL (03/04/18 0917)  . heparin 1,150 Units/hr (03/03/18 2118)   PRN Meds: sodium chloride, acetaminophen, diazepam, ondansetron (ZOFRAN) IV, sodium chloride flush   Vital Signs    Vitals:   03/03/18 1900 03/03/18 2148 03/04/18 0500 03/04/18 0915  BP: 124/67 122/70 109/62 (!) 117/59  Pulse: 62 (!) 56 (!) 53 60  Resp:   15   Temp:  97.8 F (36.6 C) 97.7 F (36.5 C)   TempSrc:  Oral Oral   SpO2: 98% 97% 98%   Weight:   176 lb 9.6 oz (80.1 kg)   Height:        Intake/Output Summary (Last 24 hours) at 03/04/2018 1016 Last data filed at 03/03/2018 2147 Gross per 24 hour  Intake 538.26 ml  Output 350 ml  Net 188.26 ml   Filed Weights   03/03/18 0837 03/04/18 0500  Weight: 178 lb (80.7 kg) 176 lb 9.6 oz (80.1 kg)    Telemetry    SR and sinus brady - Personally Reviewed  ECG    na  Physical Exam   GEN: No acute distress.   Neck: No JVD Cardiac: RRR, no murmurs, rubs, or gallops.  Respiratory: Clear to auscultation bilaterally. GI: Soft, nontender, non-distended  MS: No edema; No deformity. Neuro:  Nonfocal  Psych: Normal affect   Labs    Chemistry Recent Labs  Lab 03/04/18 0509  NA 139  K 4.7  CL 106  CO2 29  GLUCOSE 102*  BUN 13  CREATININE 0.95  CALCIUM 8.4*  PROT 5.4*  ALBUMIN 3.1*  AST 18  ALT 18  ALKPHOS 82  BILITOT 0.9  GFRNONAA >60  GFRAA >60  ANIONGAP 4*      Hematology Recent Labs  Lab 03/03/18 1543 03/04/18 0509  WBC 4.6 7.0  RBC 4.47 4.21*  HGB 13.9 13.2  HCT 43.6 40.8  MCV 97.5 96.9  MCH 31.1 31.4  MCHC 31.9 32.4  RDW 12.8 12.6  PLT 177 168    Cardiac EnzymesNo results for input(s): TROPONINI in the last 168 hours. No results for input(s): TROPIPOC in the last 168 hours.   BNPNo results for input(s): BNP, PROBNP in the last 168 hours.   DDimer No results for input(s): DDIMER in the last 168 hours.   Radiology    No results found.  Cardiac Studies    Patient Profile     67 y.o. male history of HL presented for outatient cath for chest and and SOB, cath showed severe multivessel disease including LM disease, plans for CABG next week.   Assessment & Plan    1. CAD - recent symptoms of chest pain and SOB - cath this admi with ostial RCA 75%, mid 80%, RPDA 70%. LM 75%. prox LAD 95%. LVEF 50-55% by LV gram - CT surgery consulted, plans for CABG next  week - echo pending.  - medical therapy with ASA, atorva 80, hep gtt, imdur 30, propanolol 120. Start low dose ACE-I lisinopril 2.5mg  daily.   For questions or updates, please contact [facility] Please consult [email or web address] for contact info under Cardiology/STEMI.      Joanie Coddington, MD  03/04/2018, 10:16 AM

## 2018-03-04 NOTE — Progress Notes (Signed)
  Echocardiogram 2D Echocardiogram has been performed.  [place] T [place] 03/04/2018, 4:21 PM

## 2018-03-05 ENCOUNTER — Other Ambulatory Visit (HOSPITAL_COMMUNITY): Payer: Medicare Other

## 2018-03-05 DIAGNOSIS — I25119 Atherosclerotic heart disease of native coronary artery with unspecified angina pectoris: Secondary | ICD-10-CM

## 2018-03-05 LAB — BASIC METABOLIC PANEL
Anion gap: 6 (ref 5–15)
BUN: 13 mg/dL (ref 8–23)
CO2: 28 mmol/L (ref 22–32)
Calcium: 8.6 mg/dL — ABNORMAL LOW (ref 8.9–10.3)
Chloride: 105 mmol/L (ref 98–111)
Creatinine, Ser: 0.91 mg/dL (ref 0.61–1.24)
GFR calc Af Amer: >60 mL/min (ref >60)
GFR calc non Af Amer: >60 mL/min (ref >60)
Glucose, Bld: 101 mg/dL — ABNORMAL HIGH (ref 70–99)
Potassium: 3.8 mmol/L (ref 3.5–5.1)
Sodium: 139 mmol/L (ref 135–145)

## 2018-03-05 LAB — CBC
HCT: 40.7 % (ref 39.0–52.0)
Hemoglobin: 13.5 g/dL (ref 13.0–17.0)
MCH: 31.6 pg (ref 26.0–34.0)
MCHC: 33.2 g/dL (ref 30.0–36.0)
MCV: 95.3 fL (ref 78.0–100.0)
Platelets: 164 10*3/uL (ref 150–400)
RBC: 4.27 MIL/uL (ref 4.22–5.81)
RDW: 12.7 % (ref 11.5–15.5)
WBC: 6 10*3/uL (ref 4.0–10.5)

## 2018-03-05 LAB — HEPARIN LEVEL (UNFRACTIONATED)
Heparin Unfractionated: 0.9 IU/mL — ABNORMAL HIGH (ref 0.30–0.70)
Heparin Unfractionated: 0.66 IU/mL (ref 0.30–0.70)

## 2018-03-05 NOTE — Plan of Care (Signed)
  Problem: Health Behavior/Discharge Planning: Goal: Ability to manage health-related needs will improve Outcome: Progressing   Problem: Coping: Goal: Level of anxiety will decrease Outcome: Progressing   

## 2018-03-05 NOTE — Progress Notes (Signed)
Progress Note  Patient Name: [place] Date of Encounter: 03/05/2018  Primary Cardiologist: Donnie Ahoilley  Subjective   No complaints  Inpatient Medications    Scheduled Meds: . aspirin  81 mg Oral Daily  . atorvastatin  80 mg Oral q1800  . buPROPion  300 mg Oral Daily  . escitalopram  5 mg Oral Daily  . isosorbide mononitrate  30 mg Oral Daily  . lisdexamfetamine  30 mg Oral Daily  . lisinopril  2.5 mg Oral Daily  . propranolol ER  120 mg Oral Daily  . sodium chloride flush  3 mL Intravenous Q12H   Continuous Infusions: . sodium chloride 100 mL/hr at 03/03/18 1759  . sodium chloride 10 mL/hr at 03/05/18 0604  . heparin 1,150 Units/hr (03/05/18 0604)   PRN Meds: sodium chloride, acetaminophen, diazepam, ondansetron (ZOFRAN) IV, sodium chloride flush   Vital Signs    Vitals:   03/04/18 1108 03/04/18 1425 03/04/18 2124 03/05/18 0557  BP: 112/67 119/72 118/64 126/67  Pulse:  (!) 55 (!) 57 (!) 53  Resp:      Temp:  97.7 F (36.5 C) 97.7 F (36.5 C) 98 F (36.7 C)  TempSrc:  Oral Oral Oral  SpO2:  98% 96% 96%  Weight:    174 lb 6.4 oz (79.1 kg)  Height:        Intake/Output Summary (Last 24 hours) at 03/05/2018 0908 Last data filed at 03/05/2018 0604 Gross per 24 hour  Intake 855.87 ml  Output 200 ml  Net 655.87 ml   Filed Weights   03/03/18 0837 03/04/18 0500 03/05/18 0557  Weight: 178 lb (80.7 kg) 176 lb 9.6 oz (80.1 kg) 174 lb 6.4 oz (79.1 kg)    Telemetry    NSR and sinus brady - Personally Reviewed  ECG    na  Physical Exam   GEN: No acute distress.   Neck: No JVD Cardiac: RRR, no murmurs, rubs, or gallops.  Respiratory: Clear to auscultation bilaterally. GI: Soft, nontender, non-distended  MS: No edema; No deformity. Neuro:  Nonfocal  Psych: Normal affect   Labs    Chemistry Recent Labs  Lab 03/04/18 0509 03/05/18 0748  NA 139 139  K 4.7 3.8  CL 106 105  CO2 29 28  GLUCOSE 102* 101*  BUN 13 13  CREATININE 0.95 0.91    CALCIUM 8.4* 8.6*  PROT 5.4*  --   ALBUMIN 3.1*  --   AST 18  --   ALT 18  --   ALKPHOS 82  --   BILITOT 0.9  --   GFRNONAA >60 >60  GFRAA >60 >60  ANIONGAP 4* 6     Hematology Recent Labs  Lab 03/03/18 1543 03/04/18 0509 03/05/18 0748  WBC 4.6 7.0 6.0  RBC 4.47 4.21* 4.27  HGB 13.9 13.2 13.5  HCT 43.6 40.8 40.7  MCV 97.5 96.9 95.3  MCH 31.1 31.4 31.6  MCHC 31.9 32.4 33.2  RDW 12.8 12.6 12.7  PLT 177 168 164    Cardiac EnzymesNo results for input(s): TROPONINI in the last 168 hours. No results for input(s): TROPIPOC in the last 168 hours.   BNPNo results for input(s): BNP, PROBNP in the last 168 hours.   DDimer No results for input(s): DDIMER in the last 168 hours.   Radiology    No results found.  Cardiac Studies     Patient Profile     67 y.o. male history of HL presented for outatient cath for chest and  and SOB, cath showed severe multivessel disease including LM disease, plans for CABG next week.     Assessment & Plan    1. CAD - recent symptoms of chest pain and SOB - cath this admi with ostial RCA 75%, mid 80%, RPDA 70%. LM 75%. prox LAD 95%. LVEF 50-55% by LV gram - CT surgery consulted, plans for CABG next week - echo LVEF 55-60%  - medical therapy with ASA, atorva 80, hep gtt, imdur 30, propanolol 120, lisinopril 2.5mg  daily.   Plans for CABG, timing per CT surgery.     For questions or updates, please contact [facility] Please consult [email or web address] for contact info under Cardiology/STEMI.      Joanie Coddington, MD  03/05/2018, 9:08 AM
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
## 2018-03-05 NOTE — Progress Notes (Signed)
ANTICOAGULATION CONSULT NOTE   Pharmacy Consult for Heparin  Indication: CP  No Known Allergies  Patient Measurements: Height: 5\' 10"  (177.8 cm) Weight: 174 lb 6.4 oz (79.1 kg) IBW/kg (Calculated) : 73 Heparin Dosing Weight: 80.7 kg  Vital Signs: Temp: 98.2 F (36.8 C) (06/30 2121) Temp Source: Oral (06/30 2121) BP: 114/94 (06/30 2121) Pulse Rate: 77 (06/30 2121)  Labs: Recent Labs    03/03/18 1543  03/04/18 0509 03/04/18 1333 03/05/18 0748 03/05/18 2255  HGB 13.9  --  13.2  --  13.5  --   HCT 43.6  --  40.8  --  40.7  --   PLT 177  --  168  --  164  --   LABPROT  --   --  13.8  --   --   --   INR  --   --  1.07  --   --   --   HEPARINUNFRC  --    < > 0.47 0.75* 0.90* 0.66  CREATININE  --   --  0.95  --  0.91  --    < > = values in this interval not displayed.    Estimated Creatinine Clearance: 81.3 mL/min (by C-G formula based on SCr of 0.91 mg/dL).    Assessment:  67 y.o male s/p cardiac cath awaiting CABG later this week. CBC stable.   Heparin level therapeutic  Goal of Therapy:  Heparin level 0.3-0.7 units/ml Monitor platelets by anticoagulation protocol: Yes   Plan:  Cont heparin gtt at 1050 units/hr  Daily Heparin level and CBC Monitor for s/s/ of bleeding  Thank you for allowing pharmacy to be part of this patients care team.  Talbert CageLora Cari Vandeberg, PharmD Clinical Pharmacist 03/05/2018 11:47 PM Please check AMION for all [facility] numbers

## 2018-03-06 ENCOUNTER — Inpatient Hospital Stay (HOSPITAL_COMMUNITY): Payer: Medicare Other

## 2018-03-06 ENCOUNTER — Encounter (HOSPITAL_COMMUNITY): Payer: Self-pay | Admitting: Cardiovascular Disease

## 2018-03-06 DIAGNOSIS — I2510 Atherosclerotic heart disease of native coronary artery without angina pectoris: Secondary | ICD-10-CM

## 2018-03-06 DIAGNOSIS — I6523 Occlusion and stenosis of bilateral carotid arteries: Secondary | ICD-10-CM

## 2018-03-06 DIAGNOSIS — Z01810 Encounter for preprocedural cardiovascular examination: Secondary | ICD-10-CM

## 2018-03-06 DIAGNOSIS — I10 Essential (primary) hypertension: Secondary | ICD-10-CM

## 2018-03-06 LAB — CBC
HCT: 42.3 % (ref 39.0–52.0)
Hemoglobin: 13.6 g/dL (ref 13.0–17.0)
MCH: 31.1 pg (ref 26.0–34.0)
MCHC: 32.2 g/dL (ref 30.0–36.0)
MCV: 96.6 fL (ref 78.0–100.0)
Platelets: 164 10*3/uL (ref 150–400)
RBC: 4.38 MIL/uL (ref 4.22–5.81)
RDW: 12.6 % (ref 11.5–15.5)
WBC: 6.1 10*3/uL (ref 4.0–10.5)

## 2018-03-06 LAB — BASIC METABOLIC PANEL
Anion gap: 9 (ref 5–15)
BUN: 17 mg/dL (ref 8–23)
CO2: 29 mmol/L (ref 22–32)
Calcium: 9.1 mg/dL (ref 8.9–10.3)
Chloride: 103 mmol/L (ref 98–111)
Creatinine, Ser: 0.95 mg/dL (ref 0.61–1.24)
GFR calc Af Amer: >60 mL/min (ref >60)
GFR calc non Af Amer: >60 mL/min (ref >60)
Glucose, Bld: 104 mg/dL — ABNORMAL HIGH (ref 70–99)
Potassium: 4.1 mmol/L (ref 3.5–5.1)
Sodium: 141 mmol/L (ref 135–145)

## 2018-03-06 LAB — HEPARIN LEVEL (UNFRACTIONATED): Heparin Unfractionated: 0.71 IU/mL — ABNORMAL HIGH (ref 0.30–0.70)

## 2018-03-06 MED FILL — Heparin Sod (Porcine)-NaCl IV Soln 1000 Unit/500ML-0.9%: INTRAVENOUS | Qty: 500 | Status: AC

## 2018-03-06 NOTE — Progress Notes (Signed)
Subjective:  No c/o chest pain or SOB.  Questions answered re surgery.  Carotid dopplers pending.   Objective:  Vital Signs in the last 24 hours: BP 101/71 (BP Location: Left Arm)   Pulse 60   Temp 97.6 F (36.4 C) (Oral)   Resp 15   Ht 5\' 10"  (1.778 m)   Wt 78.7 kg (173 lb 8 oz)   SpO2 96%   BMI 24.89 kg/m   Physical Exam: Pleasant male in NAD Lungs:  Clear Cardiac:  Regular rhythm, normal S1 and S2, no S3 Extremities:  No edema present  Intake/Output from previous day: 06/30 0701 - 07/01 0700 In: 258.9 [I.V.:258.9] Out: -   Weight Filed Weights   03/04/18 0500 03/05/18 0557 03/06/18 0400  Weight: 80.1 kg (176 lb 9.6 oz) 79.1 kg (174 lb 6.4 oz) 78.7 kg (173 lb 8 oz)    Lab Results: Basic Metabolic Panel: Recent Labs    03/05/18 0748 03/06/18 0511  NA 139 141  K 3.8 4.1  CL 105 103  CO2 28 29  GLUCOSE 101* 104*  BUN 13 17  CREATININE 0.91 0.95   CBC: Recent Labs    03/05/18 0748 03/06/18 0511  WBC 6.0 6.1  HGB 13.5 13.6  HCT 40.7 42.3  MCV 95.3 96.6  PLT 164 164   Telemetry: Sinus rhythm  Assessment/Plan:  1. CAD with LM and 3VD awaiting surgery  No date set yet 2. Carotid artery disease awaiting dopplers  Rec:  Continue heparin.  Surgery when schedule comes available.      Darden PalmerW. Spencer Tilley, Jr.  MD [facility]  03/06/2018, 4:05 PM

## 2018-03-06 NOTE — Consult Note (Addendum)
[facility] NOTE   MRN : [id number]  Reason for Consult: Carotid stenosis Left > right asymptomatic  Referring Physician: Dr. Alla German  History of Present Illness: 67 y/o male with asymptomatic carotid stenosis bilaterally found 80-99% stenosis on pre-op clearance carotid duplex for CABG work up.   He denise amaurosis, weakness in his extremities, or aphasia.  He initially presented with fatigue and dyspnea.  He was admitted following cardiac catheterization demonstrated severe three-vessel coronary artery disease with moderate left main stenosis, preserved LV systolic function.  Now scheduled for CABG.  Past medical history:  Hyperlipidemia and HTN.  No history of CAD.  None smoker.  He rides a bike daily and is very active.       Current Facility-Administered Medications  Medication Dose Route Frequency Provider Last Rate Last Dose  . 0.9 %  sodium chloride infusion   Intravenous Continuous [name], MD   Stopped at 03/05/18 1859  . 0.9 %  sodium chloride infusion  250 mL Intravenous PRN [name], MD   Stopped at 03/05/18 1859  . acetaminophen (TYLENOL) tablet 650 mg  650 mg Oral Q4H PRN [name], MD      . aspirin chewable tablet 81 mg  81 mg Oral Daily [name], MD   81 mg at 03/06/18 1015  . atorvastatin (LIPITOR) tablet 80 mg  80 mg Oral q1800 [name], MD   80 mg at 03/05/18 1752  . buPROPion (WELLBUTRIN XL) 24 hr tablet 300 mg  300 mg Oral Daily Bhagat, Bhavinkumar, PA   300 mg at 03/06/18 1015  . diazepam (VALIUM) tablet 5 mg  5 mg Oral Q6H PRN [name], MD      . escitalopram (LEXAPRO) tablet 5 mg  5 mg Oral Daily Bhagat, Bhavinkumar, PA   5 mg at 03/06/18 1015  . heparin ADULT infusion 100 units/mL (25000 units/265mL sodium chloride 0.45%)  1,050 Units/hr Intravenous Continuous Rudisill, Toniann Fail, RPH 10.5 mL/hr at 03/06/18 0548 1,050 Units/hr at 03/06/18 0548  . isosorbide mononitrate (IMDUR) 24 hr  tablet 30 mg  30 mg Oral Daily Bhagat, Bhavinkumar, PA   30 mg at 03/06/18 1015  . lisdexamfetamine (VYVANSE) capsule 30 mg  30 mg Oral Daily Bhagat, Bhavinkumar, PA   30 mg at 03/06/18 1015  . lisinopril (PRINIVIL,ZESTRIL) tablet 2.5 mg  2.5 mg Oral Daily Antoine Poche, MD   2.5 mg at 03/06/18 1015  . ondansetron (ZOFRAN) injection 4 mg  4 mg Intravenous Q6H PRN [name], MD      . propranolol ER (INDERAL LA) 24 hr capsule 120 mg  120 mg Oral Daily Bhagat, Bhavinkumar, PA   120 mg at 03/06/18 1015  . sodium chloride flush (NS) 0.9 % injection 3 mL  3 mL Intravenous Q12H [name], MD   3 mL at 03/05/18 2145  . sodium chloride flush (NS) 0.9 % injection 3 mL  3 mL Intravenous PRN [name], MD        Pt meds include: Statin :No Betablocker: Yes ASA: No Other anticoagulants/antiplatelets: none  Past Medical History:  Diagnosis Date  . ADHD (attention deficit hyperactivity disorder) 03/03/2018  . CAD (coronary artery disease) 03/03/2018   Cardiac cath  03/03/18 70% distal left main, 99% proximal LAD, 40-50% proximal circumflex, 60% intermediate, 90% ostial right coronary artery, 55% LVEF with some mild distal anterior hypokinesis  . Depression 06/30/2016  . Essential tremor   .  History of traumatic head injury   . Hyperlipidemia 03/03/2018    Past Surgical History:  Procedure Laterality Date  . ANKLE SURGERY    . KNEE SURGERY    . LEFT HEART CATH AND CORONARY ANGIOGRAPHY N/A 03/03/2018   Procedure: LEFT HEART CATH AND CORONARY ANGIOGRAPHY;  Surgeon: Lennette BihariKelly, Thomas A, MD;  [facility]: [facility];  Service: Cardiovascular;  Laterality: N/A;  . TONSILLECTOMY      Social History Social History   Tobacco Use  . Smoking status: Never Smoker  . Smokeless tobacco: Never Used  Substance Use Topics  . Alcohol use: Not Currently  . Drug use: Never    Family History Family History  Problem Relation Age of Onset  . Lung cancer Mother   . Diabetes Brother      No Known Allergies   REVIEW OF SYSTEMS  General: [ ]  Weight loss, [ ]  Fever, [ ]  chills Neurologic: [ ]  Dizziness, [ ]  Blackouts, [ ]  Seizure [ ]  Stroke, [ ]  "Mini stroke", [ ]  Slurred speech, [ ]  Temporary blindness; [ ]  weakness in arms or legs, [ ]  Hoarseness [ ]  Dysphagia Cardiac: [ ]  Chest pain/pressure, [x ] Shortness of breath at rest [x ] Shortness of breath with exertion, [ ]  Atrial fibrillation or irregular heartbeat  Vascular: [ ]  Pain in legs with walking, [ ]  Pain in legs at rest, [ ]  Pain in legs at night,  [ ]  Non-healing ulcer, [ ]  Blood clot in vein/DVT,   Pulmonary: [ ]  Home oxygen, [ ]  Productive cough, [ ]  Coughing up blood, [ ]  Asthma,  [ ]  Wheezing [ ]  COPD Musculoskeletal:  [ ]  Arthritis, [ ]  Low back pain, [ ]  Joint pain Hematologic: [ ]  Easy Bruising, [ ]  Anemia; [ ]  Hepatitis Gastrointestinal: [ ]  Blood in stool, [ ]  Gastroesophageal Reflux/heartburn, Urinary: [ ]  chronic Kidney disease, [ ]  on HD - [ ]  MWF or [ ]  TTHS, [ ]  Burning with urination, [ ]  Difficulty urinating Skin: [ ]  Rashes, [ ]  Wounds Psychological: [ ]  Anxiety, [ ]  Depression  Physical Examination Vitals:   03/05/18 2121 03/06/18 0400 03/06/18 0431 03/06/18 1332  BP: (!) 114/94  119/62 101/71  Pulse: 77  (!) 53 60  Resp:      Temp: 98.2 F (36.8 C)  97.6 F (36.4 C)   TempSrc: Oral  Oral   SpO2: 98%  99% 96%  Weight:  173 lb 8 oz (78.7 kg)    Height:       Body mass index is 24.89 kg/m.  General:  WDWN in NAD HENT: WNL, normocephalic Eyes: Pupils equal Pulmonary: normal non-labored breathing , without Rales, rhonchi,  wheezing Cardiac: RRR, without  Murmurs, rubs or gallops; Bilateral carotid bruits Abdomen: soft, NT, no masses Skin: no rashes, ulcers noted;  no Gangrene , no cellulitis; no open wounds;   Vascular Exam/Pulses:radial, femoral, popliteal and DP pulses palpable   Musculoskeletal: no muscle wasting or atrophy; no edema  Neurologic: A&O X 3; Appropriate  Affect worried  SENSATION: normal; MOTOR FUNCTION: 5/5 Symmetric Speech is fluent/normal   Significant Diagnostic Studies: CBC Lab Results  Component Value Date   WBC 6.1 03/06/2018   HGB 13.6 03/06/2018   HCT 42.3 03/06/2018   MCV 96.6 03/06/2018   PLT 164 03/06/2018    BMET    Component Value Date/Time   NA 141 03/06/2018 0511   K 4.1 03/06/2018 0511   CL 103 03/06/2018 0511  CO2 29 03/06/2018 0511   GLUCOSE 104 (H) 03/06/2018 0511   BUN 17 03/06/2018 0511   CREATININE 0.95 03/06/2018 0511   CALCIUM 9.1 03/06/2018 0511   GFRNONAA >60 03/06/2018 0511   GFRAA >60 03/06/2018 0511   Estimated Creatinine Clearance: 77.9 mL/min (by C-G formula based on SCr of 0.95 mg/dL).  COAG Lab Results  Component Value Date   INR 1.07 03/04/2018   INR 0.96 02/18/2011     Non-Invasive Vascular Imaging:  Carotid Findings:  Bilateral 80-99% ICA stenosis, left higher than right.   Right with PSV proximal of 386 cm/sec heterogenous plaque  Left PSV proximal ICA 616-mid 479-distal 98 cm/sec calcified ABI triphasic/biphasic  Right 0.93 Left 1.02   ASSESSMENT/PLAN:  CAD Bilateral asymptomatic carotid stenosis left > right.  He has no history of stroke or symptoms of stroke.  We will schedule his carotid endarterectomy left, then right in the future once he recovers from his CABG.  Continue with maximum medical management with Lipitor, aspirin, and BP control.     Mosetta Pigeon 03/06/2018 4:29 PM   I have independently interviewed and examined patient and agree with PA assessment and plan above. High grade stenosis bilateral ica left more so than right and he is ambidextrous. Will proceed with left cea on Wednesday with Dr. Darrick Penna in conjunction with Dr. [name] cabg.   Empress Newmann C. Randie Heinz, MD [facility] [place] Office: [phone] Pager: [phone]

## 2018-03-06 NOTE — Progress Notes (Signed)
[id number] Discussed with pt the importance of IS and mobility after surgery. He had IS and demonstrated 2000 ml. Gave In the tube handout and discussed sternal precautions. Wrote down how to view pre op video. Pt had OHS booklet and care guide given. Pt had questions re activity and CRP 2 after recovery. He will have someone available 24/7 after discharge. Did not walk due to LM disease. Wife and son present for ed also. Luetta NuttingCharlene Marieta Markov RN BSN 03/06/2018 3:19 PM

## 2018-03-06 NOTE — Progress Notes (Signed)
ANTICOAGULATION CONSULT NOTE   Pharmacy Consult for Heparin  Indication:  No Known Allergies  Patient Measurements: Height: 5\' 10"  (177.8 cm) Weight: 173 lb 8 oz (78.7 kg) IBW/kg (Calculated) : 73 Heparin Dosing Weight: 80.7 kg  Vital Signs: Temp: 97.6 F (36.4 C) (07/01 0431) Temp Source: Oral (07/01 0431) BP: 119/62 (07/01 0431) Pulse Rate: 53 (07/01 0431)  Labs: Recent Labs    03/04/18 0509  03/05/18 0748 03/05/18 2255 03/06/18 0511  HGB 13.2  --  13.5  --  13.6  HCT 40.8  --  40.7  --  42.3  PLT 168  --  164  --  164  LABPROT 13.8  --   --   --   --   INR 1.07  --   --   --   --   HEPARINUNFRC 0.47   < > 0.90* 0.66 0.71*  CREATININE 0.95  --  0.91  --  0.95   < > = values in this interval not displayed.    Estimated Creatinine Clearance: 77.9 mL/min (by C-G formula based on SCr of 0.95 mg/dL).   Medical History: Past Medical History:  Diagnosis Date  . ADHD (attention deficit hyperactivity disorder) 03/03/2018  . CAD (coronary artery disease) 03/03/2018   Cardiac cath  03/03/18 70% distal left main, 99% proximal LAD, 40-50% proximal circumflex, 60% intermediate, 90% ostial right coronary artery, 55% LVEF with some mild distal anterior hypokinesis  . Depression 06/30/2016  . Essential tremor   . History of traumatic head injury   . Hyperlipidemia 03/03/2018    Medications:  Scheduled:  . aspirin  81 mg Oral Daily  . atorvastatin  80 mg Oral q1800  . buPROPion  300 mg Oral Daily  . escitalopram  5 mg Oral Daily  . isosorbide mononitrate  30 mg Oral Daily  . lisdexamfetamine  30 mg Oral Daily  . lisinopril  2.5 mg Oral Daily  . propranolol ER  120 mg Oral Daily  . sodium chloride flush  3 mL Intravenous Q12H    Assessment:  67 y.o male s/p cardiac cath awaiting CABG later this week. CBC stable.  -Heparin level above goal: 0.71  Goal of Therapy:  Heparin level 0.3-0.7 units/ml Monitor platelets by anticoagulation protocol: Yes   Plan:  Reduce  heparin gtt to 1000 units/hr  Daily Heparin level and CBC  Harland GermanAndrew Clotilda Hafer, PharmD Clinical Pharmacist Please check Amion for pharmacy contact number 03/06/2018 10:50 AM

## 2018-03-06 NOTE — Progress Notes (Addendum)
Pre-op Cardiac Surgery  Carotid Findings:  Bilateral 80-99% ICA stenosis, left higher than right.  Called results to Dr. Donata ClayVan Trigt  Upper Extremity Right Left  Brachial Pressures 144T 137T  Radial Waveforms T T  Ulnar Waveforms T T  Palmar Arch (Allen's Test) WNL Doppler signal remains normal with radial compression and obliterates with ulnar compression   Findings:      Lower  Extremity Right Left  Dorsalis Pedis 60M 130B  Anterior Tibial    Posterior Tibial 134B 130B  Ankle/Brachial Indices 0.93 1.02    Findings:  Right ABI indicates mild reduction in arterial blood flow.  Left ABI indicates normal arterial blood flow.

## 2018-03-07 ENCOUNTER — Inpatient Hospital Stay (HOSPITAL_COMMUNITY): Payer: Medicare Other

## 2018-03-07 DIAGNOSIS — I739 Peripheral vascular disease, unspecified: Secondary | ICD-10-CM

## 2018-03-07 DIAGNOSIS — I779 Disorder of arteries and arterioles, unspecified: Secondary | ICD-10-CM

## 2018-03-07 DIAGNOSIS — I25110 Atherosclerotic heart disease of native coronary artery with unstable angina pectoris: Secondary | ICD-10-CM

## 2018-03-07 HISTORY — DX: Disorder of arteries and arterioles, unspecified: I77.9

## 2018-03-07 LAB — PREPARE RBC (CROSSMATCH)

## 2018-03-07 LAB — BLOOD GAS, ARTERIAL
Acid-Base Excess: 1.2 mmol/L (ref 0.0–2.0)
Bicarbonate: 25.4 mmol/L (ref 20.0–28.0)
Drawn by: 280981
FIO2: 21
O2 Saturation: 96.5 %
Patient temperature: 98.6
pCO2 arterial: 41.8 mmHg (ref 32.0–48.0)
pH, Arterial: 7.401 (ref 7.350–7.450)
pO2, Arterial: 87.8 mmHg (ref 83.0–108.0)

## 2018-03-07 LAB — CBC
HCT: 45.2 % (ref 39.0–52.0)
Hemoglobin: 14.6 g/dL (ref 13.0–17.0)
MCH: 31.1 pg (ref 26.0–34.0)
MCHC: 32.3 g/dL (ref 30.0–36.0)
MCV: 96.2 fL (ref 78.0–100.0)
Platelets: 186 10*3/uL (ref 150–400)
RBC: 4.7 MIL/uL (ref 4.22–5.81)
RDW: 12.8 % (ref 11.5–15.5)
WBC: 8 10*3/uL (ref 4.0–10.5)

## 2018-03-07 LAB — BASIC METABOLIC PANEL
Anion gap: 6 (ref 5–15)
BUN: 17 mg/dL (ref 8–23)
CO2: 28 mmol/L (ref 22–32)
Calcium: 9 mg/dL (ref 8.9–10.3)
Chloride: 103 mmol/L (ref 98–111)
Creatinine, Ser: 0.89 mg/dL (ref 0.61–1.24)
GFR calc Af Amer: >60 mL/min (ref >60)
GFR calc non Af Amer: >60 mL/min (ref >60)
Glucose, Bld: 114 mg/dL — ABNORMAL HIGH (ref 70–99)
Potassium: 3.9 mmol/L (ref 3.5–5.1)
Sodium: 137 mmol/L (ref 135–145)

## 2018-03-07 LAB — HEPARIN LEVEL (UNFRACTIONATED): Heparin Unfractionated: 0.67 IU/mL (ref 0.30–0.70)

## 2018-03-07 MED ORDER — CHLORHEXIDINE GLUCONATE 4 % EX LIQD
60.0000 mL | Freq: Once | CUTANEOUS | Status: AC
  Administered 2018-03-08: 4 via TOPICAL
  Filled 2018-03-07: qty 60

## 2018-03-07 MED ORDER — DOPAMINE-DEXTROSE 3.2-5 MG/ML-% IV SOLN
0.0000 ug/kg/min | INTRAVENOUS | Status: DC
  Filled 2018-03-07: qty 250

## 2018-03-07 MED ORDER — SODIUM CHLORIDE 0.9 % IV SOLN
30.0000 ug/min | INTRAVENOUS | Status: AC
  Administered 2018-03-08: 25 ug/min via INTRAVENOUS
  Filled 2018-03-07: qty 20

## 2018-03-07 MED ORDER — NITROGLYCERIN IN D5W 200-5 MCG/ML-% IV SOLN
2.0000 ug/min | INTRAVENOUS | Status: AC
  Administered 2018-03-08: 16.6 ug/min via INTRAVENOUS
  Filled 2018-03-07: qty 250

## 2018-03-07 MED ORDER — TRANEXAMIC ACID 1000 MG/10ML IV SOLN
1.5000 mg/kg/h | INTRAVENOUS | Status: DC
  Filled 2018-03-07: qty 25

## 2018-03-07 MED ORDER — DEXMEDETOMIDINE HCL IN NACL 400 MCG/100ML IV SOLN
0.1000 ug/kg/h | INTRAVENOUS | Status: AC
  Administered 2018-03-08: 0.7 ug/kg/h via INTRAVENOUS
  Filled 2018-03-07: qty 100

## 2018-03-07 MED ORDER — CHLORHEXIDINE GLUCONATE 0.12 % MT SOLN
15.0000 mL | Freq: Once | OROMUCOSAL | Status: AC
  Administered 2018-03-08: 15 mL via OROMUCOSAL
  Filled 2018-03-07: qty 15

## 2018-03-07 MED ORDER — POTASSIUM CHLORIDE 2 MEQ/ML IV SOLN
80.0000 meq | INTRAVENOUS | Status: DC
  Filled 2018-03-07: qty 40

## 2018-03-07 MED ORDER — METOPROLOL TARTRATE 12.5 MG HALF TABLET
12.5000 mg | ORAL_TABLET | Freq: Once | ORAL | Status: AC
  Administered 2018-03-08: 12.5 mg via ORAL
  Filled 2018-03-07: qty 1

## 2018-03-07 MED ORDER — CHLORHEXIDINE GLUCONATE 4 % EX LIQD
60.0000 mL | Freq: Once | CUTANEOUS | Status: AC
  Administered 2018-03-07: 4 via TOPICAL
  Filled 2018-03-07: qty 60

## 2018-03-07 MED ORDER — SODIUM CHLORIDE 0.9 % IV SOLN
750.0000 mg | INTRAVENOUS | Status: AC
  Administered 2018-03-08: 750 mg via INTRAVENOUS
  Filled 2018-03-07: qty 750

## 2018-03-07 MED ORDER — MILRINONE LACTATE IN DEXTROSE 20-5 MG/100ML-% IV SOLN
0.3750 ug/kg/min | INTRAVENOUS | Status: AC
  Administered 2018-03-08: .25 ug/kg/min via INTRAVENOUS
  Filled 2018-03-07: qty 100

## 2018-03-07 MED ORDER — VANCOMYCIN HCL 10 G IV SOLR
1250.0000 mg | INTRAVENOUS | Status: AC
  Administered 2018-03-08: 1250 mg via INTRAVENOUS
  Filled 2018-03-07: qty 1250

## 2018-03-07 MED ORDER — TRANEXAMIC ACID (OHS) PUMP PRIME SOLUTION
2.0000 mg/kg | INTRAVENOUS | Status: DC
  Filled 2018-03-07: qty 1.59

## 2018-03-07 MED ORDER — PLASMA-LYTE 148 IV SOLN
INTRAVENOUS | Status: DC
  Filled 2018-03-07: qty 2.5

## 2018-03-07 MED ORDER — CEFUROXIME SODIUM 1.5 G IV SOLR
1.5000 g | INTRAVENOUS | Status: AC
  Administered 2018-03-08: 1.5 g via INTRAVENOUS
  Filled 2018-03-07: qty 1.5

## 2018-03-07 MED ORDER — TEMAZEPAM 15 MG PO CAPS: 15.0000 mg | ORAL_CAPSULE | Freq: Once | ORAL | Status: DC | PRN

## 2018-03-07 MED ORDER — SODIUM CHLORIDE 0.9 % IV SOLN
INTRAVENOUS | Status: DC
  Filled 2018-03-07: qty 30

## 2018-03-07 MED ORDER — SODIUM CHLORIDE 0.9 % IV SOLN
INTRAVENOUS | Status: DC
  Filled 2018-03-07: qty 1

## 2018-03-07 MED ORDER — EPINEPHRINE PF 1 MG/ML IJ SOLN
0.0000 ug/min | INTRAVENOUS | Status: DC
  Filled 2018-03-07: qty 4

## 2018-03-07 MED ORDER — BISACODYL 5 MG PO TBEC: 5.0000 mg | DELAYED_RELEASE_TABLET | Freq: Once | ORAL | Status: DC

## 2018-03-07 MED ORDER — MAGNESIUM SULFATE 50 % IJ SOLN
40.0000 meq | INTRAMUSCULAR | Status: DC
  Filled 2018-03-07: qty 9.85

## 2018-03-07 MED ORDER — TRANEXAMIC ACID (OHS) BOLUS VIA INFUSION
15.0000 mg/kg | INTRAVENOUS | Status: AC
  Administered 2018-03-08: 1192.5 mg via INTRAVENOUS
  Filled 2018-03-07: qty 1193

## 2018-03-07 NOTE — Progress Notes (Signed)
Patient called out that he felt lightheaded when walking to the bathroom. BP 127/71 MAP 86. Patient also stated that he felt anxious and sweaty. Patient states that these symptoms have occurred previously to him while here and that he has a history of lightheadedness/passing out since young. Assisted patient to use urinal and back in bed, resting comfortably. Will continue to monitor.

## 2018-03-07 NOTE — H&P (View-Only) (Signed)
   I discussed the risk and benefits of carotid endarterectomy including risk to his heart although he is undergoing coronary artery bypass concomitantly as well as risk of stroke around 2% risk of cranial nerve injury 5% that could affect his speech or swallowing and voice.  I also specifically discussed the risk of hematoma and particularly numbness to his neck that he will most certainly have moving forward.  He does demonstrate good understanding and we will plan left-sided carotid endarterectomy with Dr. fields in the OR tomorrow prior to Dr. Van Trigt's surgery.  Danely Bayliss C. Cooper Moroney, MD [facility] [place] Office: [phone] Pager: [phone]  

## 2018-03-07 NOTE — Care Management Note (Addendum)
Case Management Note  Patient Details  Name: Zachary LamerDouglas S Goswick MRN: [id number] Date of Birth: May 07, 1951  Subjective/Objective:  Pt presented for Chest Pain s/p cath 03-03-18. Plan for CABG 03-08-18. PTA from home with family support. Pt states he has DME RW and 3n1 in the home. [facility]- pt feels he may need services post procedure 2/2 wife has back issues and will undergo surgery in August.                  Action/Plan: CM will continue to monitor post procedure for disposition needs.   Expected Discharge Date:                  Expected Discharge Plan:  Home w Home Health Services  In-House Referral:  NA  Discharge planning Services  CM Consult  Post Acute Care Choice:  Home Health Choice offered to:     DME Arranged:    DME Agency:     HH Arranged:    HH Agency:     Status of Service:  In process, will continue to follow  If discussed at Long Length of Stay Meetings, dates discussed:    Additional Comments:  Gala LewandowskyGraves-Bigelow, Analilia Geddis Kaye, RN 03/07/2018, 3:10 PM

## 2018-03-07 NOTE — Progress Notes (Signed)
4 Days Post-Op Procedure(s) (LRB): LEFT HEART CATH AND CORONARY ANGIOGRAPHY (N/A) Subjective: Patient admitted with unstable angina and severe three-vessel CAD with left main stenosis Echo shows preserved LV systolic function Pre-CABG Dopplers however demonstrated probable greater than 90% left carotid stenosis. Patient evaluated for combined CABG with left carotid endarterectomy because of severe carotid disease. I discussed the situation with Dr. Randie Heinzain for coordination of care and we will proceed in a.m. with left carotid endarterectomy performed initially by Dr. fields followed by multivessel CABG. Patient understands the plan for surgery and the reason for combining the 2 procedures. Objective: Vital signs in last 24 hours: Temp:  [97.5 F (36.4 C)-97.7 F (36.5 C)] 97.7 F (36.5 C) (07/02 0619) Pulse Rate:  [55-60] 60 (07/02 1503) Cardiac Rhythm: Sinus bradycardia;Heart block (07/02 1150) Resp:  [16] 16 (07/01 2113) BP: (112-148)/(70-79) 133/75 (07/02 1503) SpO2:  [96 %-99 %] 97 % (07/02 1503) Weight:  [175 lb 4.8 oz (79.5 kg)] 175 lb 4.8 oz (79.5 kg) (07/02 0619)  Hemodynamic parameters for last 24 hours:    Intake/Output from previous day: 07/01 0701 - 07/02 0700 In: 970.8 [P.O.:720; I.V.:250.8] Out: 510 [Urine:510] Intake/Output this shift: Total I/O In: 857.8 [P.O.:720; I.V.:137.8] Out: -        Exam    General- alert and comfortable    Neck- no JVD, no cervical adenopathy palpable,  L>R carotid bruit   Lungs- clear without rales, wheezes   Cor- regular rate and rhythm, no murmur , gallop   Abdomen- soft, non-tender   Extremities - warm, non-tender, minimal edema   Neuro- oriented, appropriate, no focal weakness   Lab Results: Recent Labs    03/06/18 0511 03/07/18 0350  WBC 6.1 8.0  HGB 13.6 14.6  HCT 42.3 45.2  PLT 164 186   BMET:  Recent Labs    03/06/18 0511 03/07/18 0350  NA 141 137  K 4.1 3.9  CL 103 103  CO2 29 28  GLUCOSE 104* 114*  BUN  17 17  CREATININE 0.95 0.89  CALCIUM 9.1 9.0    PT/INR: No results for input(s): LABPROT, INR in the last 72 hours. ABG    Component Value Date/Time   PHART 7.401 03/07/2018 1658   HCO3 25.4 03/07/2018 1658   O2SAT 96.5 03/07/2018 1658   CBG (last 3)  No results for input(s): GLUCAP in the last 72 hours.  Assessment/Plan: S/P Procedure(s) (LRB): LEFT HEART CATH AND CORONARY ANGIOGRAPHY (N/A) CABG in a.m.   LOS: 4 days    Zachary Ponce 03/07/2018

## 2018-03-07 NOTE — Progress Notes (Signed)
   I discussed the risk and benefits of carotid endarterectomy including risk to his heart although he is undergoing coronary artery bypass concomitantly as well as risk of stroke around 2% risk of cranial nerve injury 5% that could affect his speech or swallowing and voice.  I also specifically discussed the risk of hematoma and particularly numbness to his neck that he will most certainly have moving forward.  He does demonstrate good understanding and we will plan left-sided carotid endarterectomy with Dr. Darrick Pennafields in the OR tomorrow prior to Dr. Zenaida NieceVan Trigt's surgery.  Lucia Mccreadie C. Randie Heinzain, MD [facility] [place] Office: [phone] Pager: [phone]

## 2018-03-07 NOTE — Anesthesia Preprocedure Evaluation (Signed)
Anesthesia Evaluation  Patient identified by MRN, date of birth, ID band Patient awake    Reviewed: Allergy & Precautions, NPO status , Patient's Chart, lab work & pertinent test results, reviewed documented beta blocker date and time   Airway Mallampati: II  TM Distance: >3 FB Neck ROM: Full    Dental  (+) Dental Advisory Given   Pulmonary neg pulmonary ROS,    breath sounds clear to auscultation       Cardiovascular + angina + CAD and + Peripheral Vascular Disease   Rhythm:Regular Rate:Normal  '19 Carotid US - bilateral 80-99% ICAS  '19 TTE - Mild LVH. EF 55% to 60%. No RWMA. Mild-mod calcified aortic annulus. Trivial MR. Severely dilated LA. Normal RV. Mild TR.   '19 Cath -  Ost RCA lesion is 75% stenosed.  Mid RCA-1 lesion is 80% stenosed.  Mid RCA-2 lesion is 50% stenosed.  Dist RCA lesion is 50% stenosed.  RPDA lesion is 70% stenosed.  Mid LM lesion is 75% stenosed.  Ost Ramus to Ramus lesion is 60% stenosed.  Prox LAD lesion is 95% stenosed.  Ost 1st Mrg to 1st Mrg lesion is 20% stenosed.  Ost Cx to Prox Cx lesion is 30% stenosed.   Neuro/Psych Depression Essential tremor     GI/Hepatic negative GI ROS, Neg liver ROS,   Endo/Other  negative endocrine ROS  Renal/GU negative Renal ROS  negative genitourinary   Musculoskeletal negative musculoskeletal ROS (+)   Abdominal   Peds  (+) ADHD Hematology negative hematology ROS (+)   Anesthesia Other Findings   Reproductive/Obstetrics                             Anesthesia Physical Anesthesia Plan  ASA: IV  Anesthesia Plan: General   Post-op Pain Management:    Induction: Intravenous  PONV Risk Score and Plan: 2 and Treatment may vary due to age or medical condition  Airway Management Planned: Oral ETT  Additional Equipment: Arterial line, CVP, TEE, Ultrasound Guidance Line Placement and PA Cath  Intra-op Plan:    Post-operative Plan: Post-operative intubation/ventilation  Informed Consent: I have reviewed the patients History and Physical, chart, labs and discussed the procedure including the risks, benefits and alternatives for the proposed anesthesia with the patient or authorized representative who has indicated his/her understanding and acceptance.   Dental advisory given  Plan Discussed with: CRNA and Anesthesiologist  Anesthesia Plan Comments:         Anesthesia Quick Evaluation

## 2018-03-07 NOTE — Progress Notes (Signed)
ANTICOAGULATION CONSULT NOTE   Pharmacy Consult for Heparin  Indication:  No Known Allergies  Patient Measurements: Height: 5\' 10"  (177.8 cm) Weight: 175 lb 4.8 oz (79.5 kg) IBW/kg (Calculated) : 73 Heparin Dosing Weight: 80.7 kg  Vital Signs: Temp: 97.7 F (36.5 C) (07/02 0619) Temp Source: Oral (07/02 0619) BP: 120/70 (07/02 0619) Pulse Rate: 56 (07/02 0619)  Labs: Recent Labs    03/05/18 0748 03/05/18 2255 03/06/18 0511 03/07/18 0350  HGB 13.5  --  13.6 14.6  HCT 40.7  --  42.3 45.2  PLT 164  --  164 186  HEPARINUNFRC 0.90* 0.66 0.71* 0.67  CREATININE 0.91  --  0.95 0.89    Estimated Creatinine Clearance: 83.2 mL/min (by C-G formula based on SCr of 0.89 mg/dL).   Medical History: Past Medical History:  Diagnosis Date  . ADHD (attention deficit hyperactivity disorder) 03/03/2018  . CAD (coronary artery disease) 03/03/2018   Cardiac cath  03/03/18 70% distal left main, 99% proximal LAD, 40-50% proximal circumflex, 60% intermediate, 90% ostial right coronary artery, 55% LVEF with some mild distal anterior hypokinesis  . Depression 06/30/2016  . Essential tremor   . History of traumatic head injury   . Hyperlipidemia 03/03/2018    Medications:  Scheduled:  . aspirin  81 mg Oral Daily  . atorvastatin  80 mg Oral q1800  . buPROPion  300 mg Oral Daily  . escitalopram  5 mg Oral Daily  . isosorbide mononitrate  30 mg Oral Daily  . lisdexamfetamine  30 mg Oral Daily  . lisinopril  2.5 mg Oral Daily  . propranolol ER  120 mg Oral Daily  . sodium chloride flush  3 mL Intravenous Q12H    Assessment:  67 y.o male s/p cardiac with 3CAD for CABG and L CEA on 7/3 -Heparin level is at goal -CBC stable  Goal of Therapy:  Heparin level 0.3-0.7 units/ml Monitor platelets by anticoagulation protocol: Yes   Plan:  No heparin changes needed Daily Heparin level and CBC  Harland GermanAndrew Antoney Biven, PharmD Clinical Pharmacist Please check Amion for pharmacy contact  number 03/07/2018 10:37 AM

## 2018-03-07 NOTE — Progress Notes (Signed)
Subjective:  An episode of sweating and dizziness when he got up last night to use the bathroom.  Feeling well today without chest pain or shortness of breath.  Has significant carotid artery disease and plans are for concomitant carotid artery endarterectomy tomorrow at the time of bypass.  Objective:  Vital Signs in the last 24 hours: BP 120/70 (BP Location: Left Arm)   Pulse (!) 56   Temp 97.7 F (36.5 C) (Oral)   Resp 16   Ht 5\' 10"  (1.778 m)   Wt 79.5 kg (175 lb 4.8 oz)   SpO2 99%   BMI 25.15 kg/m   Physical Exam: Pleasant male in NAD Neck: Bilateral carotid bruits Lungs:  Clear Cardiac:  Regular rhythm, normal S1 and S2, no S3 Extremities:  No edema present  Intake/Output from previous day: 07/01 0701 - 07/02 0700 In: 970.8 [P.O.:720; I.V.:250.8] Out: 510 [Urine:510]  Weight Filed Weights   03/05/18 0557 03/06/18 0400 03/07/18 0619  Weight: 79.1 kg (174 lb 6.4 oz) 78.7 kg (173 lb 8 oz) 79.5 kg (175 lb 4.8 oz)    Lab Results: Basic Metabolic Panel: Recent Labs    03/06/18 0511 03/07/18 0350  NA 141 137  K 4.1 3.9  CL 103 103  CO2 29 28  GLUCOSE 104* 114*  BUN 17 17  CREATININE 0.95 0.89   CBC: Recent Labs    03/06/18 0511 03/07/18 0350  WBC 6.1 8.0  HGB 13.6 14.6  HCT 42.3 45.2  MCV 96.6 96.2  PLT 164 186   Telemetry: Sinus rhythm  Assessment/Plan:  1. CAD with LM and 3VD awaiting surgery  No date set yet 2. Carotid artery disease left greater than right 3.  Hyperlipidemia   Rec:  Continue heparin.  Concomitant left carotid endarterectomy with CABG Delaney Meigsamara according to chart.    Darden PalmerW. Spencer Tilley, Jr.  MD [facility]  03/07/2018, 9:35 AM

## 2018-03-08 ENCOUNTER — Inpatient Hospital Stay (HOSPITAL_COMMUNITY): Payer: Medicare Other | Admitting: Anesthesiology

## 2018-03-08 ENCOUNTER — Inpatient Hospital Stay (HOSPITAL_COMMUNITY): Payer: Medicare Other

## 2018-03-08 ENCOUNTER — Encounter (HOSPITAL_COMMUNITY): Payer: Medicare Other

## 2018-03-08 ENCOUNTER — Encounter (HOSPITAL_COMMUNITY)
Admission: AD | Disposition: A | Discharge status: Home Health | Payer: Self-pay | Source: Home / Self Care | Attending: Cardiology

## 2018-03-08 DIAGNOSIS — Z951 Presence of aortocoronary bypass graft: Secondary | ICD-10-CM

## 2018-03-08 HISTORY — PX: CORONARY ARTERY BYPASS GRAFT: SHX141

## 2018-03-08 HISTORY — PX: ENDARTERECTOMY: SHX5162

## 2018-03-08 HISTORY — DX: Presence of aortocoronary bypass graft: Z95.1

## 2018-03-08 HISTORY — PX: TEE WITHOUT CARDIOVERSION: SHX5443

## 2018-03-08 LAB — POCT I-STAT 3, ART BLOOD GAS (G3+)
Acid-Base Excess: 1 mmol/L (ref 0.0–2.0)
Acid-base deficit: 1 mmol/L (ref 0.0–2.0)
Acid-base deficit: 2 mmol/L (ref 0.0–2.0)
Acid-base deficit: 4 mmol/L — ABNORMAL HIGH (ref 0.0–2.0)
Bicarbonate: 25.4 mmol/L (ref 20.0–28.0)
Bicarbonate: 25.6 mmol/L (ref 20.0–28.0)
Bicarbonate: 24.2 mmol/L (ref 20.0–28.0)
Bicarbonate: 24.1 mmol/L (ref 20.0–28.0)
Bicarbonate: 22.6 mmol/L (ref 20.0–28.0)
Bicarbonate: 23.1 mmol/L (ref 20.0–28.0)
O2 Saturation: 100 %
O2 Saturation: 100 %
O2 Saturation: 100 %
O2 Saturation: 100 %
O2 Saturation: 99 %
O2 Saturation: 99 %
Patient temperature: 36
Patient temperature: 36.1
Patient temperature: 36.1
TCO2: 27 mmol/L (ref 22–32)
TCO2: 27 mmol/L (ref 22–32)
TCO2: 25 mmol/L (ref 22–32)
TCO2: 25 mmol/L (ref 22–32)
TCO2: 24 mmol/L (ref 22–32)
TCO2: 24 mmol/L (ref 22–32)
pCO2 arterial: 42.2 mmHg (ref 32.0–48.0)
pCO2 arterial: 37.8 mmHg (ref 32.0–48.0)
pCO2 arterial: 34.2 mmHg (ref 32.0–48.0)
pCO2 arterial: 41.5 mmHg (ref 32.0–48.0)
pCO2 arterial: 38.8 mmHg (ref 32.0–48.0)
pCO2 arterial: 44.7 mmHg (ref 32.0–48.0)
pH, Arterial: 7.388 (ref 7.350–7.450)
pH, Arterial: 7.438 (ref 7.350–7.450)
pH, Arterial: 7.457 — ABNORMAL HIGH (ref 7.350–7.450)
pH, Arterial: 7.367 (ref 7.350–7.450)
pH, Arterial: 7.307 — ABNORMAL LOW (ref 7.350–7.450)
pH, Arterial: 7.379 (ref 7.350–7.450)
pO2, Arterial: 399 mmHg — ABNORMAL HIGH (ref 83.0–108.0)
pO2, Arterial: 366 mmHg — ABNORMAL HIGH (ref 83.0–108.0)
pO2, Arterial: 182 mmHg — ABNORMAL HIGH (ref 83.0–108.0)
pO2, Arterial: 170 mmHg — ABNORMAL HIGH (ref 83.0–108.0)
pO2, Arterial: 159 mmHg — ABNORMAL HIGH (ref 83.0–108.0)
pO2, Arterial: 166 mmHg — ABNORMAL HIGH (ref 83.0–108.0)

## 2018-03-08 LAB — POCT I-STAT, CHEM 8
BUN: 18 mg/dL (ref 8–23)
BUN: 16 mg/dL (ref 8–23)
BUN: 13 mg/dL (ref 8–23)
BUN: 15 mg/dL (ref 8–23)
BUN: 14 mg/dL (ref 8–23)
Calcium, Ion: 1.19 mmol/L (ref 1.15–1.40)
Calcium, Ion: 1.18 mmol/L (ref 1.15–1.40)
Calcium, Ion: 0.93 mmol/L — ABNORMAL LOW (ref 1.15–1.40)
Calcium, Ion: 1 mmol/L — ABNORMAL LOW (ref 1.15–1.40)
Calcium, Ion: 1.02 mmol/L — ABNORMAL LOW (ref 1.15–1.40)
Chloride: 100 mmol/L (ref 98–111)
Chloride: 103 mmol/L (ref 98–111)
Chloride: 97 mmol/L — ABNORMAL LOW (ref 98–111)
Chloride: 100 mmol/L (ref 98–111)
Chloride: 101 mmol/L (ref 98–111)
Creatinine, Ser: 0.7 mg/dL (ref 0.61–1.24)
Creatinine, Ser: 0.6 mg/dL — ABNORMAL LOW (ref 0.61–1.24)
Creatinine, Ser: 0.5 mg/dL — ABNORMAL LOW (ref 0.61–1.24)
Creatinine, Ser: 0.5 mg/dL — ABNORMAL LOW (ref 0.61–1.24)
Creatinine, Ser: 0.7 mg/dL (ref 0.61–1.24)
Glucose, Bld: 93 mg/dL (ref 70–99)
Glucose, Bld: 106 mg/dL — ABNORMAL HIGH (ref 70–99)
Glucose, Bld: 85 mg/dL (ref 70–99)
Glucose, Bld: 115 mg/dL — ABNORMAL HIGH (ref 70–99)
Glucose, Bld: 108 mg/dL — ABNORMAL HIGH (ref 70–99)
HCT: 37 % — ABNORMAL LOW (ref 39.0–52.0)
HCT: 36 % — ABNORMAL LOW (ref 39.0–52.0)
HCT: 26 % — ABNORMAL LOW (ref 39.0–52.0)
HCT: 27 % — ABNORMAL LOW (ref 39.0–52.0)
HCT: 26 % — ABNORMAL LOW (ref 39.0–52.0)
Hemoglobin: 12.6 g/dL — ABNORMAL LOW (ref 13.0–17.0)
Hemoglobin: 12.2 g/dL — ABNORMAL LOW (ref 13.0–17.0)
Hemoglobin: 8.8 g/dL — ABNORMAL LOW (ref 13.0–17.0)
Hemoglobin: 9.2 g/dL — ABNORMAL LOW (ref 13.0–17.0)
Hemoglobin: 8.8 g/dL — ABNORMAL LOW (ref 13.0–17.0)
Potassium: 3.8 mmol/L (ref 3.5–5.1)
Potassium: 3.9 mmol/L (ref 3.5–5.1)
Potassium: 4 mmol/L (ref 3.5–5.1)
Potassium: 4 mmol/L (ref 3.5–5.1)
Potassium: 3.9 mmol/L (ref 3.5–5.1)
Sodium: 138 mmol/L (ref 135–145)
Sodium: 138 mmol/L (ref 135–145)
Sodium: 139 mmol/L (ref 135–145)
Sodium: 137 mmol/L (ref 135–145)
Sodium: 137 mmol/L (ref 135–145)
TCO2: 26 mmol/L (ref 22–32)
TCO2: 27 mmol/L (ref 22–32)
TCO2: 26 mmol/L (ref 22–32)
TCO2: 26 mmol/L (ref 22–32)
TCO2: 23 mmol/L (ref 22–32)

## 2018-03-08 LAB — CBC
HCT: 44.1 % (ref 39.0–52.0)
HCT: 31.6 % — ABNORMAL LOW (ref 39.0–52.0)
HCT: 29.4 % — ABNORMAL LOW (ref 39.0–52.0)
Hemoglobin: 14.4 g/dL (ref 13.0–17.0)
Hemoglobin: 10.3 g/dL — ABNORMAL LOW (ref 13.0–17.0)
Hemoglobin: 9.4 g/dL — ABNORMAL LOW (ref 13.0–17.0)
MCH: 31.1 pg (ref 26.0–34.0)
MCH: 31.5 pg (ref 26.0–34.0)
MCH: 31.3 pg (ref 26.0–34.0)
MCHC: 32.7 g/dL (ref 30.0–36.0)
MCHC: 32.6 g/dL (ref 30.0–36.0)
MCHC: 32 g/dL (ref 30.0–36.0)
MCV: 95.2 fL (ref 78.0–100.0)
MCV: 96.6 fL (ref 78.0–100.0)
MCV: 98 fL (ref 78.0–100.0)
Platelets: 189 10*3/uL (ref 150–400)
Platelets: 103 10*3/uL — ABNORMAL LOW (ref 150–400)
Platelets: 108 10*3/uL — ABNORMAL LOW (ref 150–400)
RBC: 4.63 MIL/uL (ref 4.22–5.81)
RBC: 3.27 MIL/uL — ABNORMAL LOW (ref 4.22–5.81)
RBC: 3 MIL/uL — ABNORMAL LOW (ref 4.22–5.81)
RDW: 13.2 % (ref 11.5–15.5)
RDW: 12.8 % (ref 11.5–15.5)
RDW: 13.1 % (ref 11.5–15.5)
WBC: 7.9 10*3/uL (ref 4.0–10.5)
WBC: 10.6 10*3/uL — ABNORMAL HIGH (ref 4.0–10.5)
WBC: 11.8 10*3/uL — ABNORMAL HIGH (ref 4.0–10.5)

## 2018-03-08 LAB — BASIC METABOLIC PANEL
Anion gap: 10 (ref 5–15)
BUN: 17 mg/dL (ref 8–23)
CO2: 26 mmol/L (ref 22–32)
Calcium: 9.1 mg/dL (ref 8.9–10.3)
Chloride: 102 mmol/L (ref 98–111)
Creatinine, Ser: 0.8 mg/dL (ref 0.61–1.24)
GFR calc Af Amer: >60 mL/min (ref >60)
GFR calc non Af Amer: >60 mL/min (ref >60)
Glucose, Bld: 99 mg/dL (ref 70–99)
Potassium: 3.8 mmol/L (ref 3.5–5.1)
Sodium: 138 mmol/L (ref 135–145)

## 2018-03-08 LAB — POCT I-STAT 4, (NA,K, GLUC, HGB,HCT)
Glucose, Bld: 120 mg/dL — ABNORMAL HIGH (ref 70–99)
Glucose, Bld: 151 mg/dL — ABNORMAL HIGH (ref 70–99)
HCT: 27 % — ABNORMAL LOW (ref 39.0–52.0)
HCT: 25 % — ABNORMAL LOW (ref 39.0–52.0)
Hemoglobin: 9.2 g/dL — ABNORMAL LOW (ref 13.0–17.0)
Hemoglobin: 8.5 g/dL — ABNORMAL LOW (ref 13.0–17.0)
Potassium: 3.9 mmol/L (ref 3.5–5.1)
Potassium: 4.5 mmol/L (ref 3.5–5.1)
Sodium: 140 mmol/L (ref 135–145)
Sodium: 138 mmol/L (ref 135–145)

## 2018-03-08 LAB — HEMOGLOBIN AND HEMATOCRIT, BLOOD
HCT: 29.1 % — ABNORMAL LOW (ref 39.0–52.0)
Hemoglobin: 9.7 g/dL — ABNORMAL LOW (ref 13.0–17.0)

## 2018-03-08 LAB — PULMONARY FUNCTION TEST
FEF 25-75 Pre: 2.66 L/sec
FEF2575-%Pred-Pre: 102 %
FEV1-%Pred-Pre: 93 %
FEV1-Pre: 3.13 L
FEV1FVC-%Pred-Pre: 103 %
FEV6-%Pred-Pre: 95 %
FEV6-Pre: 4.07 L
FEV6FVC-%Pred-Pre: 104 %
FVC-%Pred-Pre: 90 %
Pre FEV1/FVC ratio: 76 %
Pre FEV6/FVC Ratio: 99 %

## 2018-03-08 LAB — PLATELET COUNT: Platelets: 122 10*3/uL — ABNORMAL LOW (ref 150–400)

## 2018-03-08 LAB — CREATININE, SERUM
Creatinine, Ser: 0.87 mg/dL (ref 0.61–1.24)
GFR calc Af Amer: >60 mL/min (ref >60)
GFR calc non Af Amer: >60 mL/min (ref >60)

## 2018-03-08 LAB — HEPARIN LEVEL (UNFRACTIONATED): Heparin Unfractionated: 0.62 IU/mL (ref 0.30–0.70)

## 2018-03-08 LAB — GLUCOSE, CAPILLARY
Glucose-Capillary: 116 mg/dL — ABNORMAL HIGH (ref 70–99)
Glucose-Capillary: 118 mg/dL — ABNORMAL HIGH (ref 70–99)
Glucose-Capillary: 111 mg/dL — ABNORMAL HIGH (ref 70–99)
Glucose-Capillary: 122 mg/dL — ABNORMAL HIGH (ref 70–99)

## 2018-03-08 LAB — APTT
aPTT: 96 seconds — ABNORMAL HIGH (ref 24–36)
aPTT: 35 seconds (ref 24–36)

## 2018-03-08 LAB — PROTIME-INR
INR: 1.38
Prothrombin Time: 16.9 seconds — ABNORMAL HIGH (ref 11.4–15.2)

## 2018-03-08 LAB — ABO/RH: ABO/RH(D): O POS

## 2018-03-08 LAB — MAGNESIUM: Magnesium: 3 mg/dL — ABNORMAL HIGH (ref 1.7–2.4)

## 2018-03-08 LAB — HEMOGLOBIN A1C
Hgb A1c MFr Bld: 5.5 % (ref 4.8–5.6)
Mean Plasma Glucose: 111.15 mg/dL

## 2018-03-08 SURGERY — CORONARY ARTERY BYPASS GRAFTING (CABG)
Anesthesia: General | Site: Chest

## 2018-03-08 MED ORDER — LACTATED RINGERS IV SOLN: INTRAVENOUS | Status: DC

## 2018-03-08 MED ORDER — OXYCODONE HCL 5 MG PO TABS
5.0000 mg | ORAL_TABLET | ORAL | Status: DC | PRN
  Administered 2018-03-08: 10 mg via ORAL
  Administered 2018-03-09: 5 mg via ORAL
  Administered 2018-03-09 (×3): 10 mg via ORAL
  Filled 2018-03-08 (×3): qty 2
  Filled 2018-03-08: qty 1
  Filled 2018-03-08: qty 2

## 2018-03-08 MED ORDER — PROPOFOL 10 MG/ML IV BOLUS
INTRAVENOUS | Status: DC | PRN
  Administered 2018-03-08: 30 mg via INTRAVENOUS

## 2018-03-08 MED ORDER — LACTATED RINGERS IV SOLN
500.0000 mL | Freq: Once | INTRAVENOUS | Status: AC | PRN
  Administered 2018-03-09: 500 mL via INTRAVENOUS

## 2018-03-08 MED ORDER — FENTANYL CITRATE (PF) 250 MCG/5ML IJ SOLN
INTRAMUSCULAR | Status: AC
  Filled 2018-03-08: qty 20

## 2018-03-08 MED ORDER — MILRINONE LACTATE IN DEXTROSE 20-5 MG/100ML-% IV SOLN
0.3000 ug/kg/min | INTRAVENOUS | Status: DC
  Administered 2018-03-09: 0.3 ug/kg/min via INTRAVENOUS
  Filled 2018-03-08: qty 100

## 2018-03-08 MED ORDER — LIDOCAINE 2% (20 MG/ML) 5 ML SYRINGE
INTRAMUSCULAR | Status: DC | PRN
  Administered 2018-03-08: 100 mg via INTRAVENOUS

## 2018-03-08 MED ORDER — TRANEXAMIC ACID 1000 MG/10ML IV SOLN
INTRAVENOUS | Status: DC | PRN
  Administered 2018-03-08: 1.5 mg/kg/h via INTRAVENOUS

## 2018-03-08 MED ORDER — SODIUM CHLORIDE 0.9 % IV SOLN
INTRAVENOUS | Status: DC
  Filled 2018-03-08: qty 1

## 2018-03-08 MED ORDER — NITROGLYCERIN IN D5W 200-5 MCG/ML-% IV SOLN
0.0000 ug/min | INTRAVENOUS | Status: DC
  Administered 2018-03-08: 0 ug/min via INTRAVENOUS

## 2018-03-08 MED ORDER — MORPHINE SULFATE (PF) 2 MG/ML IV SOLN
2.0000 mg | INTRAVENOUS | Status: DC | PRN
  Administered 2018-03-08: 2 mg via INTRAVENOUS
  Administered 2018-03-08: 5 mg via INTRAVENOUS
  Administered 2018-03-09: 2 mg via INTRAVENOUS
  Filled 2018-03-08 (×2): qty 1
  Filled 2018-03-08: qty 3

## 2018-03-08 MED ORDER — VANCOMYCIN HCL IN DEXTROSE 1-5 GM/200ML-% IV SOLN
1000.0000 mg | Freq: Once | INTRAVENOUS | Status: AC
  Administered 2018-03-08: 1000 mg via INTRAVENOUS
  Filled 2018-03-08: qty 200

## 2018-03-08 MED ORDER — HEPARIN SODIUM (PORCINE) 1000 UNIT/ML IJ SOLN
INTRAMUSCULAR | Status: AC
  Filled 2018-03-08: qty 1

## 2018-03-08 MED ORDER — PHENYLEPHRINE HCL 10 MG/ML IJ SOLN
INTRAMUSCULAR | Status: DC | PRN
  Administered 2018-03-08: 40 ug via INTRAVENOUS

## 2018-03-08 MED ORDER — METOPROLOL TARTRATE 5 MG/5ML IV SOLN
2.5000 mg | INTRAVENOUS | Status: DC | PRN
  Administered 2018-03-10: 5 mg via INTRAVENOUS
  Filled 2018-03-08: qty 5

## 2018-03-08 MED ORDER — MIDAZOLAM HCL 2 MG/2ML IJ SOLN: 2.0000 mg | INTRAMUSCULAR | Status: DC | PRN

## 2018-03-08 MED ORDER — ALBUMIN HUMAN 5 % IV SOLN
250.0000 mL | INTRAVENOUS | Status: DC | PRN
  Administered 2018-03-08 – 2018-03-09 (×2): 250 mL via INTRAVENOUS
  Filled 2018-03-08 (×2): qty 250

## 2018-03-08 MED ORDER — PANTOPRAZOLE SODIUM 40 MG PO TBEC
40.0000 mg | DELAYED_RELEASE_TABLET | Freq: Every day | ORAL | Status: DC
  Administered 2018-03-10 – 2018-03-11 (×2): 40 mg via ORAL
  Filled 2018-03-08 (×2): qty 1

## 2018-03-08 MED ORDER — HEMOSTATIC AGENTS (NO CHARGE) OPTIME
TOPICAL | Status: DC | PRN
  Administered 2018-03-08: 2
  Administered 2018-03-08 (×3): 1
  Administered 2018-03-08: 1 via TOPICAL

## 2018-03-08 MED ORDER — PHENYLEPHRINE HCL-NACL 20-0.9 MG/250ML-% IV SOLN
0.0000 ug/min | INTRAVENOUS | Status: DC
  Administered 2018-03-08: 20 ug/min via INTRAVENOUS
  Administered 2018-03-09: 35 ug/min via INTRAVENOUS
  Filled 2018-03-08 (×2): qty 250

## 2018-03-08 MED ORDER — SODIUM CHLORIDE 0.9 % IV SOLN
INTRAVENOUS | Status: DC | PRN
  Administered 2018-03-08: .7 [IU]/h via INTRAVENOUS

## 2018-03-08 MED ORDER — LIDOCAINE HCL (PF) 1 % IJ SOLN
INTRAMUSCULAR | Status: AC
  Filled 2018-03-08: qty 30

## 2018-03-08 MED ORDER — SODIUM CHLORIDE 0.9 % IV SOLN
INTRAVENOUS | Status: DC | PRN
  Administered 2018-03-08: 500 mL

## 2018-03-08 MED ORDER — MIDAZOLAM HCL 10 MG/2ML IJ SOLN
INTRAMUSCULAR | Status: AC
  Filled 2018-03-08: qty 2

## 2018-03-08 MED ORDER — PAPAVERINE HCL 30 MG/ML IJ SOLN
INTRAMUSCULAR | Status: DC | PRN
  Administered 2018-03-08: 500 mL via INTRAVASCULAR

## 2018-03-08 MED ORDER — 0.9 % SODIUM CHLORIDE (POUR BTL) OPTIME
TOPICAL | Status: DC | PRN
  Administered 2018-03-08: 5000 mL

## 2018-03-08 MED ORDER — CHLORHEXIDINE GLUCONATE 0.12 % MT SOLN
15.0000 mL | OROMUCOSAL | Status: AC
  Administered 2018-03-08: 15 mL via OROMUCOSAL

## 2018-03-08 MED ORDER — ROCURONIUM BROMIDE 10 MG/ML (PF) SYRINGE
PREFILLED_SYRINGE | INTRAVENOUS | Status: AC
  Filled 2018-03-08: qty 30

## 2018-03-08 MED ORDER — FAMOTIDINE IN NACL 20-0.9 MG/50ML-% IV SOLN: 20.0000 mg | Freq: Two times a day (BID) | INTRAVENOUS | Status: AC

## 2018-03-08 MED ORDER — DOCUSATE SODIUM 100 MG PO CAPS
200.0000 mg | ORAL_CAPSULE | Freq: Every day | ORAL | Status: DC
  Administered 2018-03-09 – 2018-03-11 (×3): 200 mg via ORAL
  Filled 2018-03-08 (×3): qty 2

## 2018-03-08 MED ORDER — SODIUM CHLORIDE 0.9 % IV SOLN
INTRAVENOUS | Status: AC
  Filled 2018-03-08: qty 1.2

## 2018-03-08 MED ORDER — ACETAMINOPHEN 160 MG/5ML PO SOLN: 1000.0000 mg | Freq: Four times a day (QID) | ORAL | Status: DC

## 2018-03-08 MED ORDER — PROPOFOL 10 MG/ML IV BOLUS
INTRAVENOUS | Status: AC
  Filled 2018-03-08: qty 20

## 2018-03-08 MED ORDER — MIDAZOLAM HCL 5 MG/ML IJ SOLN
INTRAMUSCULAR | Status: DC | PRN
  Administered 2018-03-08: 4 mg via INTRAVENOUS
  Administered 2018-03-08: 6 mg via INTRAVENOUS

## 2018-03-08 MED ORDER — SODIUM CHLORIDE 0.9 % IV SOLN: INTRAVENOUS | Status: DC

## 2018-03-08 MED ORDER — ROCURONIUM BROMIDE 10 MG/ML (PF) SYRINGE
PREFILLED_SYRINGE | INTRAVENOUS | Status: DC | PRN
  Administered 2018-03-08: 100 mg via INTRAVENOUS
  Administered 2018-03-08 (×3): 50 mg via INTRAVENOUS

## 2018-03-08 MED ORDER — LACTATED RINGERS IV SOLN
INTRAVENOUS | Status: DC | PRN
  Administered 2018-03-08 (×4): via INTRAVENOUS

## 2018-03-08 MED ORDER — ACETAMINOPHEN 650 MG RE SUPP
650.0000 mg | Freq: Once | RECTAL | Status: AC
  Administered 2018-03-08: 650 mg via RECTAL

## 2018-03-08 MED ORDER — ALBUMIN HUMAN 5 % IV SOLN
INTRAVENOUS | Status: DC | PRN
  Administered 2018-03-08 (×2): via INTRAVENOUS

## 2018-03-08 MED ORDER — SODIUM CHLORIDE 0.9% FLUSH
3.0000 mL | INTRAVENOUS | Status: DC | PRN
  Administered 2018-03-10: 3 mL via INTRAVENOUS
  Filled 2018-03-08: qty 3

## 2018-03-08 MED ORDER — SODIUM CHLORIDE 0.9 % IV SOLN: 250.0000 mL | INTRAVENOUS | Status: DC

## 2018-03-08 MED ORDER — ACETAMINOPHEN 160 MG/5ML PO SOLN: 650.0000 mg | Freq: Once | ORAL | Status: AC

## 2018-03-08 MED ORDER — GLYCOPYRROLATE 0.2 MG/ML IJ SOLN: INTRAMUSCULAR | Status: DC | PRN

## 2018-03-08 MED ORDER — PROTAMINE SULFATE 10 MG/ML IV SOLN
INTRAVENOUS | Status: DC | PRN
  Administered 2018-03-08: 10 mg via INTRAVENOUS
  Administered 2018-03-08: 40 mg via INTRAVENOUS
  Administered 2018-03-08 (×3): 50 mg via INTRAVENOUS

## 2018-03-08 MED ORDER — ASPIRIN EC 325 MG PO TBEC
325.0000 mg | DELAYED_RELEASE_TABLET | Freq: Every day | ORAL | Status: DC
  Administered 2018-03-09 – 2018-03-11 (×3): 325 mg via ORAL
  Filled 2018-03-08 (×3): qty 1

## 2018-03-08 MED ORDER — TRAMADOL HCL 50 MG PO TABS: 50.0000 mg | ORAL_TABLET | ORAL | Status: DC | PRN

## 2018-03-08 MED ORDER — METOPROLOL TARTRATE 12.5 MG HALF TABLET: 12.5000 mg | ORAL_TABLET | Freq: Two times a day (BID) | ORAL | Status: DC

## 2018-03-08 MED ORDER — PHENYLEPHRINE 40 MCG/ML (10ML) SYRINGE FOR IV PUSH (FOR BLOOD PRESSURE SUPPORT)
PREFILLED_SYRINGE | INTRAVENOUS | Status: AC
  Filled 2018-03-08: qty 10

## 2018-03-08 MED ORDER — ACETAMINOPHEN 500 MG PO TABS
1000.0000 mg | ORAL_TABLET | Freq: Four times a day (QID) | ORAL | Status: DC
  Administered 2018-03-08 – 2018-03-12 (×12): 1000 mg via ORAL
  Filled 2018-03-08 (×13): qty 2

## 2018-03-08 MED ORDER — PHENYLEPHRINE HCL 10 MG/ML IJ SOLN
INTRAMUSCULAR | Status: DC | PRN
  Administered 2018-03-08: 20 ug/min via INTRAVENOUS

## 2018-03-08 MED ORDER — SODIUM CHLORIDE 0.45 % IV SOLN: INTRAVENOUS | Status: DC | PRN

## 2018-03-08 MED ORDER — GLYCOPYRROLATE PF 0.2 MG/ML IJ SOSY
PREFILLED_SYRINGE | INTRAMUSCULAR | Status: AC
  Filled 2018-03-08: qty 2

## 2018-03-08 MED ORDER — FENTANYL CITRATE (PF) 250 MCG/5ML IJ SOLN
INTRAMUSCULAR | Status: DC | PRN
  Administered 2018-03-08: 50 ug via INTRAVENOUS
  Administered 2018-03-08 (×2): 100 ug via INTRAVENOUS
  Administered 2018-03-08: 250 ug via INTRAVENOUS
  Administered 2018-03-08: 500 ug via INTRAVENOUS

## 2018-03-08 MED ORDER — ASPIRIN 81 MG PO CHEW: 324.0000 mg | CHEWABLE_TABLET | Freq: Every day | ORAL | Status: DC

## 2018-03-08 MED ORDER — MORPHINE SULFATE (PF) 2 MG/ML IV SOLN: 1.0000 mg | INTRAVENOUS | Status: AC | PRN

## 2018-03-08 MED ORDER — GLYCOPYRROLATE 0.2 MG/ML IJ SOLN
INTRAMUSCULAR | Status: DC | PRN
  Administered 2018-03-08 (×2): 0.2 mg via INTRAVENOUS

## 2018-03-08 MED ORDER — DOPAMINE-DEXTROSE 3.2-5 MG/ML-% IV SOLN: 3.0000 ug/kg/min | INTRAVENOUS | Status: DC

## 2018-03-08 MED ORDER — METOPROLOL TARTRATE 25 MG/10 ML ORAL SUSPENSION: 12.5000 mg | Freq: Two times a day (BID) | ORAL | Status: DC

## 2018-03-08 MED ORDER — LIDOCAINE 2% (20 MG/ML) 5 ML SYRINGE
INTRAMUSCULAR | Status: AC
  Filled 2018-03-08: qty 5

## 2018-03-08 MED ORDER — SODIUM CHLORIDE 0.9 % IV SOLN
1.5000 g | Freq: Two times a day (BID) | INTRAVENOUS | Status: AC
  Administered 2018-03-08 – 2018-03-10 (×4): 1.5 g via INTRAVENOUS
  Filled 2018-03-08 (×4): qty 1.5

## 2018-03-08 MED ORDER — HEPARIN SODIUM (PORCINE) 1000 UNIT/ML IJ SOLN
INTRAMUSCULAR | Status: DC | PRN
  Administered 2018-03-08: 20000 [IU] via INTRAVENOUS
  Administered 2018-03-08: 8000 [IU] via INTRAVENOUS

## 2018-03-08 MED ORDER — SODIUM CHLORIDE 0.9% FLUSH
3.0000 mL | Freq: Two times a day (BID) | INTRAVENOUS | Status: DC
  Administered 2018-03-09 – 2018-03-11 (×4): 3 mL via INTRAVENOUS
  Administered 2018-03-11: 10 mL via INTRAVENOUS

## 2018-03-08 MED ORDER — BISACODYL 10 MG RE SUPP: 10.0000 mg | Freq: Every day | RECTAL | Status: DC

## 2018-03-08 MED ORDER — INSULIN ASPART 100 UNIT/ML ~~LOC~~ SOLN
0.0000 [IU] | SUBCUTANEOUS | Status: DC
  Administered 2018-03-08 (×2): 2 [IU] via SUBCUTANEOUS

## 2018-03-08 MED ORDER — POTASSIUM CHLORIDE 10 MEQ/50ML IV SOLN
10.0000 meq | INTRAVENOUS | Status: AC
  Administered 2018-03-08 (×3): 10 meq via INTRAVENOUS
  Filled 2018-03-08: qty 50

## 2018-03-08 MED ORDER — MAGNESIUM SULFATE 4 GM/100ML IV SOLN
4.0000 g | Freq: Once | INTRAVENOUS | Status: AC
  Administered 2018-03-08: 4 g via INTRAVENOUS
  Filled 2018-03-08: qty 100

## 2018-03-08 MED ORDER — INSULIN REGULAR BOLUS VIA INFUSION
0.0000 [IU] | Freq: Three times a day (TID) | INTRAVENOUS | Status: DC
  Filled 2018-03-08: qty 10

## 2018-03-08 MED ORDER — DEXMEDETOMIDINE HCL IN NACL 400 MCG/100ML IV SOLN
0.0000 ug/kg/h | INTRAVENOUS | Status: DC
  Administered 2018-03-08: 0.1 ug/kg/h via INTRAVENOUS

## 2018-03-08 MED ORDER — ONDANSETRON HCL 4 MG/2ML IJ SOLN
4.0000 mg | Freq: Four times a day (QID) | INTRAMUSCULAR | Status: DC | PRN
  Administered 2018-03-10 – 2018-03-11 (×2): 4 mg via INTRAVENOUS
  Filled 2018-03-08 (×2): qty 2

## 2018-03-08 MED ORDER — METOCLOPRAMIDE HCL 5 MG/ML IJ SOLN
10.0000 mg | Freq: Four times a day (QID) | INTRAMUSCULAR | Status: DC
  Administered 2018-03-08 – 2018-03-11 (×10): 10 mg via INTRAVENOUS
  Filled 2018-03-08 (×10): qty 2

## 2018-03-08 MED ORDER — DEXMEDETOMIDINE HCL IN NACL 200 MCG/50ML IV SOLN
INTRAVENOUS | Status: AC
  Filled 2018-03-08: qty 50

## 2018-03-08 MED ORDER — BISACODYL 5 MG PO TBEC
10.0000 mg | DELAYED_RELEASE_TABLET | Freq: Every day | ORAL | Status: DC
  Administered 2018-03-09 – 2018-03-11 (×3): 10 mg via ORAL
  Filled 2018-03-08 (×3): qty 2

## 2018-03-08 MED FILL — Heparin Sodium (Porcine) Inj 1000 Unit/ML: INTRAMUSCULAR | Qty: 30 | Status: AC

## 2018-03-08 MED FILL — Magnesium Sulfate Inj 50%: INTRAMUSCULAR | Qty: 10 | Status: AC

## 2018-03-08 MED FILL — Potassium Chloride Inj 2 mEq/ML: INTRAVENOUS | Qty: 40 | Status: AC

## 2018-03-08 SURGICAL SUPPLY — 125 items
ADAPTER CARDIO PERF ANTE/RETRO (ADAPTER) ×5 IMPLANT
BAG DECANTER FOR FLEXI CONT (MISCELLANEOUS) ×5 IMPLANT
BANDAGE ACE 4X5 VEL STRL LF (GAUZE/BANDAGES/DRESSINGS) ×5 IMPLANT
BANDAGE ACE 6X5 VEL STRL LF (GAUZE/BANDAGES/DRESSINGS) ×5 IMPLANT
BASKET HEART  (ORDER IN 25'S) (MISCELLANEOUS) ×1
BASKET HEART (ORDER IN 25'S) (MISCELLANEOUS) ×1
BASKET HEART (ORDER IN 25S) (MISCELLANEOUS) ×3 IMPLANT
BLADE CLIPPER SURG (BLADE) IMPLANT
BLADE STERNUM SYSTEM 6 (BLADE) ×5 IMPLANT
BLADE SURG 11 STRL SS (BLADE) ×2 IMPLANT
BLADE SURG 12 STRL SS (BLADE) ×5 IMPLANT
BNDG GAUZE ELAST 4 BULKY (GAUZE/BANDAGES/DRESSINGS) ×5 IMPLANT
BOOT SUTURE AID YELLOW STND (SUTURE) ×2 IMPLANT
CANISTER SUCT 3000ML PPV (MISCELLANEOUS) ×10 IMPLANT
CANNULA GUNDRY RCSP 15FR (MISCELLANEOUS) ×5 IMPLANT
CANNULA VESSEL 3MM 2 BLNT TIP (CANNULA) ×10 IMPLANT
CATH CPB KIT VANTRIGT (MISCELLANEOUS) ×5 IMPLANT
CATH ROBINSON RED A/P 18FR (CATHETERS) ×20 IMPLANT
CATH THORACIC 36FR RT ANG (CATHETERS) ×5 IMPLANT
CLIP FOGARTY SPRING 6M (CLIP) ×2 IMPLANT
CLIP VESOCCLUDE MED 6/CT (CLIP) ×5 IMPLANT
CLIP VESOCCLUDE SM WIDE 6/CT (CLIP) ×5 IMPLANT
COVER MAYO STAND STRL (DRAPES) ×2 IMPLANT
CRADLE DONUT ADULT HEAD (MISCELLANEOUS) ×10 IMPLANT
DECANTER SPIKE VIAL GLASS SM (MISCELLANEOUS) IMPLANT
DERMABOND ADVANCED (GAUZE/BANDAGES/DRESSINGS) ×2
DERMABOND ADVANCED .7 DNX12 (GAUZE/BANDAGES/DRESSINGS) ×3 IMPLANT
DRAIN CHANNEL 32F RND 10.7 FF (WOUND CARE) ×5 IMPLANT
DRAIN HEMOVAC 1/8 X 5 (WOUND CARE) ×2 IMPLANT
DRAPE CARDIOVASCULAR INCISE (DRAPES) ×2
DRAPE SLUSH/WARMER DISC (DRAPES) ×5 IMPLANT
DRAPE SRG 135X102X78XABS (DRAPES) ×3 IMPLANT
DRSG AQUACEL AG ADV 3.5X14 (GAUZE/BANDAGES/DRESSINGS) ×5 IMPLANT
DRSG COVADERM 4X8 (GAUZE/BANDAGES/DRESSINGS) ×2 IMPLANT
ELECT BLADE 4.0 EZ CLEAN MEGAD (MISCELLANEOUS) ×5
ELECT BLADE 6.5 EXT (BLADE) ×5 IMPLANT
ELECT CAUTERY BLADE 6.4 (BLADE) ×5 IMPLANT
ELECT REM PT RETURN 9FT ADLT (ELECTROSURGICAL) ×15
ELECTRODE BLDE 4.0 EZ CLN MEGD (MISCELLANEOUS) ×3 IMPLANT
ELECTRODE REM PT RTRN 9FT ADLT (ELECTROSURGICAL) ×9 IMPLANT
EVACUATOR SILICONE 100CC (DRAIN) ×2 IMPLANT
FELT TEFLON 1X6 (MISCELLANEOUS) ×10 IMPLANT
FLOSEAL 5ML (HEMOSTASIS) ×2 IMPLANT
GAUZE SPONGE 4X4 12PLY STRL (GAUZE/BANDAGES/DRESSINGS) ×8 IMPLANT
GLOVE BIO SURGEON STRL SZ7.5 (GLOVE) ×20 IMPLANT
GOWN STRL REUS W/ TWL LRG LVL3 (GOWN DISPOSABLE) ×21 IMPLANT
GOWN STRL REUS W/TWL LRG LVL3 (GOWN DISPOSABLE) ×14
GRAFT VASC PATCH XENOSURE 1X14 (Vascular Products) ×2 IMPLANT
HEMOSTAT POWDER SURGIFOAM 1G (HEMOSTASIS) ×15 IMPLANT
HEMOSTAT SPONGE AVITENE ULTRA (HEMOSTASIS) ×2 IMPLANT
HEMOSTAT SURGICEL 2X14 (HEMOSTASIS) ×5 IMPLANT
INSERT FOGARTY XLG (MISCELLANEOUS) IMPLANT
KIT BASIN OR (CUSTOM PROCEDURE TRAY) ×10 IMPLANT
KIT SHUNT ARGYLE CAROTID ART 6 (VASCULAR PRODUCTS) IMPLANT
KIT SUCTION CATH 14FR (SUCTIONS) ×5 IMPLANT
KIT TURNOVER KIT B (KITS) ×10 IMPLANT
KIT VASOVIEW HEMOPRO VH 3000 (KITS) ×5 IMPLANT
LEAD PACING MYOCARDI (MISCELLANEOUS) ×5 IMPLANT
MARKER GRAFT CORONARY BYPASS (MISCELLANEOUS) ×15 IMPLANT
NDL HYPO 25GX1X1/2 BEV (NEEDLE) IMPLANT
NEEDLE HYPO 25GX1X1/2 BEV (NEEDLE) ×5 IMPLANT
NS IRRIG 1000ML POUR BTL (IV SOLUTION) ×35 IMPLANT
PACK CAROTID (CUSTOM PROCEDURE TRAY) ×5 IMPLANT
PACK E OPEN HEART (SUTURE) ×5 IMPLANT
PACK OPEN HEART (CUSTOM PROCEDURE TRAY) ×5 IMPLANT
PAD ARMBOARD 7.5X6 YLW CONV (MISCELLANEOUS) ×20 IMPLANT
PAD ELECT DEFIB RADIOL ZOLL (MISCELLANEOUS) ×5 IMPLANT
PENCIL BUTTON HOLSTER BLD 10FT (ELECTRODE) ×5 IMPLANT
POWDER SURGICEL 3.0 GRAM (HEMOSTASIS) ×4 IMPLANT
PUNCH AORTIC ROTATE  4.5MM 8IN (MISCELLANEOUS) ×2 IMPLANT
PUNCH AORTIC ROTATE 4.0MM (MISCELLANEOUS) IMPLANT
PUNCH AORTIC ROTATE 4.5MM 8IN (MISCELLANEOUS) IMPLANT
PUNCH AORTIC ROTATE 5MM 8IN (MISCELLANEOUS) IMPLANT
SENSOR MYOCARDIAL TEMP (MISCELLANEOUS) ×2 IMPLANT
SET CARDIOPLEGIA MPS 5001102 (MISCELLANEOUS) ×2 IMPLANT
SHUNT CAROTID BYPASS 10 (VASCULAR PRODUCTS) ×2 IMPLANT
SHUNT CAROTID BYPASS 12FRX15.5 (VASCULAR PRODUCTS) IMPLANT
SOLUTION ANTI FOG 6CC (MISCELLANEOUS) ×2 IMPLANT
SPONGE LAP 18X18 X RAY DECT (DISPOSABLE) ×2 IMPLANT
SURGIFLO W/THROMBIN 8M KIT (HEMOSTASIS) ×5 IMPLANT
SUT BONE WAX W31G (SUTURE) ×5 IMPLANT
SUT ETHILON 3 0 PS 1 (SUTURE) IMPLANT
SUT MNCRL AB 4-0 PS2 18 (SUTURE) ×2 IMPLANT
SUT PROLENE 3 0 SH DA (SUTURE) IMPLANT
SUT PROLENE 3 0 SH1 36 (SUTURE) IMPLANT
SUT PROLENE 4 0 RB 1 (SUTURE) ×2
SUT PROLENE 4 0 SH DA (SUTURE) ×5 IMPLANT
SUT PROLENE 4-0 RB1 .5 CRCL 36 (SUTURE) ×3 IMPLANT
SUT PROLENE 5 0 C 1 36 (SUTURE) IMPLANT
SUT PROLENE 6 0 C 1 30 (SUTURE) ×8 IMPLANT
SUT PROLENE 6 0 CC (SUTURE) ×24 IMPLANT
SUT PROLENE 8 0 BV175 6 (SUTURE) IMPLANT
SUT PROLENE BLUE 7 0 (SUTURE) ×9 IMPLANT
SUT PROLENE POLY MONO (SUTURE) ×4 IMPLANT
SUT SILK  1 MH (SUTURE)
SUT SILK 1 MH (SUTURE) IMPLANT
SUT SILK 2 0 (SUTURE) ×2
SUT SILK 2 0 SH CR/8 (SUTURE) ×2 IMPLANT
SUT SILK 2 0SH CR/8 30 (SUTURE) ×2 IMPLANT
SUT SILK 2-0 18XBRD TIE 12 (SUTURE) IMPLANT
SUT SILK 3 0 (SUTURE)
SUT SILK 3 0 SH CR/8 (SUTURE) IMPLANT
SUT SILK 3 0 TIES 17X18 (SUTURE)
SUT SILK 3-0 18XBRD TIE 12 (SUTURE) IMPLANT
SUT SILK 3-0 18XBRD TIE BLK (SUTURE) IMPLANT
SUT STEEL 6MS V (SUTURE) ×10 IMPLANT
SUT STEEL SZ 6 DBL 3X14 BALL (SUTURE) ×5 IMPLANT
SUT VIC AB 1 CTX 18 (SUTURE) ×2 IMPLANT
SUT VIC AB 1 CTX 36 (SUTURE) ×6
SUT VIC AB 1 CTX36XBRD ANBCTR (SUTURE) ×6 IMPLANT
SUT VIC AB 2-0 CT1 27 (SUTURE) ×2
SUT VIC AB 2-0 CT1 TAPERPNT 27 (SUTURE) IMPLANT
SUT VIC AB 2-0 CTX 27 (SUTURE) ×4 IMPLANT
SUT VIC AB 3-0 SH 27 (SUTURE) ×2
SUT VIC AB 3-0 SH 27X BRD (SUTURE) ×3 IMPLANT
SUT VIC AB 3-0 X1 27 (SUTURE) ×2 IMPLANT
SUT VICRYL 4-0 PS2 18IN ABS (SUTURE) ×5 IMPLANT
SYR CONTROL 10ML LL (SYRINGE) IMPLANT
SYSTEM SAHARA CHEST DRAIN ATS (WOUND CARE) ×5 IMPLANT
TOWEL GREEN STERILE (TOWEL DISPOSABLE) ×10 IMPLANT
TOWEL GREEN STERILE FF (TOWEL DISPOSABLE) ×5 IMPLANT
TRAY FOLEY SLVR 16FR TEMP STAT (SET/KITS/TRAYS/PACK) ×5 IMPLANT
TUBING INSUFFLATION (TUBING) ×5 IMPLANT
UNDERPAD 30X30 (UNDERPADS AND DIAPERS) ×5 IMPLANT
WATER STERILE IRR 1000ML POUR (IV SOLUTION) ×15 IMPLANT

## 2018-03-08 NOTE — Anesthesia Procedure Notes (Signed)
Central Venous Catheter Insertion Performed by: Roderic Palau, MD, anesthesiologist Start/End7/11/2017 7:45 AM, 03/08/2018 8:00 AM Patient location: Pre-op. Preanesthetic checklist: patient identified, IV checked, site marked, risks and benefits discussed, surgical consent, monitors and equipment checked, pre-op evaluation, timeout performed and anesthesia consent Position: Trendelenburg Lidocaine 1% used for infiltration and patient sedated Hand hygiene performed , maximum sterile barriers used  and Seldinger technique used Catheter size: 9 Fr Total catheter length 10. Central line was placed.MAC introducer Procedure performed using ultrasound guided technique. Ultrasound Notes:anatomy identified, needle tip was noted to be adjacent to the nerve/plexus identified, no ultrasound evidence of intravascular and/or intraneural injection and image(s) printed for medical record Attempts: 1 Following insertion, line sutured, dressing applied and Biopatch. Post procedure assessment: blood return through all ports, free fluid flow and no air  Patient tolerated the procedure well with no immediate complications.

## 2018-03-08 NOTE — Op Note (Signed)
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 The patient was placed in a supine position on the operating room table. After induction of general anesthesia, the patient's entire neck and chest was prepped and draped in the usual sterile fashion. An oblique incision was made on the left aspect of the patient's neck anterior to the border the left sternocleidomastoid muscle. The incision was carried into the subcutaneous tissues and through the platysma. The sternocleidomastoid muscle was identified and reflected laterally. The omohyoid muscle was identified and this was divided with cautery. The common carotid artery was then found at the base of the incision this was dissected free circumferentially. It was fairly soft on palpation but thickened.  The vagus nerve was identified and protected. Dissection was then carried up to the level carotid bifurcation.   The hyperglossal nerve was above the primary area of dissection. The internal carotid artery was dissected free circumferentially just below the level of the hypoglossal nerve and it was soft in character at this location and above any palpable disease. A vessel loop was placed around this. One lymph node in this area was removed to provide exposure.  It was sent to pathology as a specimen.  Next the external carotid and superior thyroid arteries were dissected free circumferentially and vessel loops were placed around these. The patient was given 8000 units of intravenous heparin.  After 2 minutes of circulation time  and raising the mean arterial pressure to 90 mm mercury, the distal internal carotid artery was controlled with a kitzmiller clamp. The external carotid and superior thyroid arteries were controlled with vessel loops. The common carotid artery was controlled with a peripheral DeBakey clamp. A longitudinal opening was made in the common carotid artery just below the bifurcation. The arteriotomy was extended distally up into the internal carotid with Potts scissors. There was a large rubbery thick plaque with greater than 80% stenosis in the internal carotid.  A 10 Fr shunt was brought onto the field and fashioned to fit the patient's artery.  This was threaded into the distal internal carotid artery and allowed to backbleed thoroughly.  There was good but not pulsatile backbleeding.  This was then threaded into the common carotid and secured with a Rummel tourniquet.   There was no air at this point and flow was restored to the brain.  Attention was then turned to the common carotid artery once again. A suitable endarterectomy plane was obtained and endarterectomy was begun in the common carotid artery and a good proximal endpoint was obtained. An eversion endarterectomy was performed on the external carotid artery and a good endpoint was obtained. The plaque was then elevated in the internal carotid artery and a nice feathered distal endpoint was also obtained.  The plaque was passed off the table.  All loose debris was then removed from the carotid bed and everything was thoroughly irrigated with heparinized saline. A bovine pericardial patch was then brought on to the operative field and this was sewn on as a patch angioplasty using a running 6-0 Prolene suture. Prior to completion of the anastomosis the internal carotid artery was thoroughly backbled. This was then controlled again with a fine bulldog clamp.  The common carotid was thoroughly flushed forward. The external carotid was also thoroughly backbled.  The  remainder of the patch was completed and the anastomosis was secured. Flow was then restored first retrograde from the external carotid into the carotid bed then antegrade from the common carotid to the external carotid artery and after approximately 5 cardiac cycles to the internal carotid artery. Doppler was used to evaluate the external/internal and common carotid arteries and these all had good Doppler flow.   At this point the artery was packed with Avitene and a Raytec to close the wound post CABG.  After the CABG was completed one repair suture was placed in the patch.  The Avitene was removed.  A 10 flat JP was brought out through a separate stab incision and secured to the skin.  The platysma was closed with a 3 0 Vicryl stitch.  The skin was closed with a 4 0 vicryl subcuticular stitch.  The patient was taken to the SICU in stable condition.  Fabienne Bruns, MD [facility] Office: [phone] Pager: [phone]

## 2018-03-08 NOTE — Transfer of Care (Signed)
Immediate Anesthesia Transfer of Care Note  Patient: Zachary Ponce  Procedure(s) Performed: CORONARY ARTERY BYPASS GRAFTING (CABG) X4, RIGHT SAPHENOUS VEIN HARVEST. LIMA TO LAD, SVG TO OM, SVG TO RAMUS, SVG TO PD. (N/A Chest) TRANSESOPHAGEAL ECHOCARDIOGRAM (TEE) (N/A ) ENDARTERECTOMY CAROTID (Left )  Patient Location: SICU  Anesthesia Type:General  Level of Consciousness: sedated and unresponsive  Airway & Oxygen Therapy: Patient remains intubated per anesthesia plan and Patient placed on Ventilator (see vital sign flow sheet for setting)  Post-op Assessment: Report given to RN and Post -op Vital signs reviewed and stable  Post vital signs: Reviewed and stable  Last Vitals:  Vitals Value Taken Time  BP    Temp    Pulse 80 03/08/2018  4:31 PM  Resp 12 03/08/2018  4:31 PM  SpO2 100 % 03/08/2018  4:31 PM  Vitals shown include unvalidated device data.  Last Pain:  Vitals:   03/08/18 0502  TempSrc: Oral  PainSc:       Patients Stated Pain Goal: 0 (03/06/18 1030)  Complications: No apparent anesthesia complications

## 2018-03-08 NOTE — Anesthesia Procedure Notes (Signed)
Arterial Line Insertion Start/End7/11/2017 8:00 AM, 03/08/2018 8:00 AM Performed by: CRNA  Patient location: Pre-op. Preanesthetic checklist: patient identified, IV checked, site marked, risks and benefits discussed, surgical consent, monitors and equipment checked, pre-op evaluation, timeout performed and anesthesia consent Lidocaine 1% used for infiltration Left, radial was placed Catheter size: 20 G Hand hygiene performed  and maximum sterile barriers used   Attempts: 3 Procedure performed without using ultrasound guided technique. Following insertion, dressing applied and Biopatch. Post procedure assessment: normal  Patient tolerated the procedure well with no immediate complications.

## 2018-03-08 NOTE — Brief Op Note (Signed)
03/03/2018 - 03/08/2018  12:44 PM  PATIENT:  Jocelyn Lamerouglas S Bohac  67 y.o. male  PRE-OPERATIVE DIAGNOSIS:  CAD  POST-OPERATIVE DIAGNOSIS:  * No post-op diagnosis entered *  PROCEDURE:  Procedure(s):  CORONARY ARTERY BYPASS GRAFTING x 4 -LIMA to LAD -SVG to PDA -SVG to OM -SVG to RAMUS INTERMEDIATE  ENDOSCOPIC HARVEST GREATER SAPHENOUS VEIN -Right Leg  TRANSESOPHAGEAL ECHOCARDIOGRAM (TEE) (N/A)  ENDARTERECTOMY CAROTID (Left)  SURGEON:  Surgeon(s) and Role: Panel 1:    * Kerin PernaVan Trigt, Peter, MD - Primary Panel 2:    * Sherren KernsFields, Charles E, MD - Primary  PHYSICIAN ASSISTANT: Lowella DandyErin Deandre Stansel PA-C, Doreatha MassedSamantha Rhyne PA-C  ANESTHESIA:   general  EBL:  1250 mL   BLOOD ADMINISTERED:2 FFP, CELLSAVER  DRAINS: Left Pleural Chest Tubes, Mediastinal Chest Drains, JP Drain Left Neck   LOCAL MEDICATIONS USED:  NONE  SPECIMEN:  Source of Specimen:  Carotid Plaque  DISPOSITION OF SPECIMEN:  PATHOLOGY  COUNTS:  YES  TOURNIQUET:  * No tourniquets in log *  DICTATION: .Dragon Dictation  PLAN OF CARE: Admit to inpatient   PATIENT DISPOSITION:  ICU - intubated and hemodynamically stable.   Delay start of Pharmacological VTE agent (>24hrs) due to surgical blood loss or risk of bleeding: yes

## 2018-03-08 NOTE — Progress Notes (Signed)
Pt awake and alert following commands. Minimal JP output UE LE 5/5 motor no facial asymmetry No neck hematoma  Stable post left CEA  Zachary Brunsharles Fields, Zachary Ponce [facility] [place] Office: [phone] Pager: [phone]

## 2018-03-08 NOTE — Progress Notes (Signed)
Patient interviewed in the preop area. Patient able to confirm name, DOB, procedure, no allergies, metal in elbow, No pain and NPO status. Once patient arrived to OR, patient was able move over to OR bed with minimal assistance.  Yvetta CoderLeah Tulsi Crossett, RN

## 2018-03-08 NOTE — Anesthesia Postprocedure Evaluation (Signed)
Anesthesia Post Note  Patient: Zachary Ponce  Procedure(s) Performed: CORONARY ARTERY BYPASS GRAFTING (CABG) X4, RIGHT SAPHENOUS VEIN HARVEST. LIMA TO LAD, SVG TO OM, SVG TO RAMUS, SVG TO PD. (N/A Chest) TRANSESOPHAGEAL ECHOCARDIOGRAM (TEE) (N/A ) ENDARTERECTOMY CAROTID (Left )     Patient location during evaluation: ICU Anesthesia Type: General Level of consciousness: sedated and patient remains intubated per anesthesia plan Pain management: pain level controlled Vital Signs Assessment: post-procedure vital signs reviewed and stable Respiratory status: respiratory function stable, patient remains intubated per anesthesia plan and patient on ventilator - see flowsheet for VS Cardiovascular status: stable Postop Assessment: no apparent nausea or vomiting Anesthetic complications: no    Last Vitals:  Vitals:   03/08/18 0502 03/08/18 1631  BP: 99/65   Pulse: (!) 55 80  Resp: 16 12  Temp: 36.7 C   SpO2: 97% 100%    Last Pain:  Vitals:   03/08/18 0502  TempSrc: Oral  PainSc:                  Beryle Lathehomas E Brock

## 2018-03-08 NOTE — Progress Notes (Signed)
Patient ID: Jocelyn LamerDouglas S Solorzano, male   DOB: 02/05/1951, 67 y.o.   MRN: [id number] EVENING ROUNDS NOTE :     [address]       [place] [id number]             [phone]                 Day of Surgery Procedure(s) (LRB): CORONARY ARTERY BYPASS GRAFTING (CABG) X4, RIGHT SAPHENOUS VEIN HARVEST. LIMA TO LAD, SVG TO OM, SVG TO RAMUS, SVG TO PD. (N/A) TRANSESOPHAGEAL ECHOCARDIOGRAM (TEE) (N/A) ENDARTERECTOMY CAROTID (Left)  Total Length of Stay:  LOS: 5 days  BP (!) 69/49   Pulse 86   Temp (!) 96.3 F (35.7 C)   Resp 12   Ht 5\' 10"  (1.778 m)   Wt 171 lb 1.6 oz (77.6 kg)   SpO2 100%   BMI 24.55 kg/m   .Intake/Output      07/02 0701 - 07/03 0700 07/03 0701 - 07/04 0700   P.O. 720    I.V. (mL/kg) 190.3 (2.5) 3158.5 (40.7)   Blood  430   IV Piggyback  909.6   Total Intake(mL/kg) 910.3 (11.7) 4498.2 (58)   Urine (mL/kg/hr)  1790 (1.9)   Blood  1250   Chest Tube  70   Total Output  3110   Net +910.3 +1388.2        Urine Occurrence 1 x      . sodium chloride    . [START ON 03/09/2018] sodium chloride    . sodium chloride    . albumin human    . cefUROXime (ZINACEF)  IV    . dexmedetomidine (PRECEDEX) IV infusion Stopped (03/08/18 1800)  . DOPamine Stopped (03/08/18 1741)  . famotidine (PEPCID) IV    . insulin (NOVOLIN-R) infusion    . lactated ringers    . lactated ringers 20 mL/hr (03/08/18 1630)  . lactated ringers 20 mL/hr (03/08/18 1630)  . magnesium sulfate 4 g (03/08/18 1630)  . milrinone 0.3 mcg/kg/min (03/08/18 1700)  . nitroGLYCERIN Stopped (03/08/18 1633)  . phenylephrine (NEO-SYNEPHRINE) Adult infusion 20 mcg/min (03/08/18 1800)  . potassium chloride 10 mEq (03/08/18 1804)  . vancomycin       Lab Results  Component Value Date   WBC 10.6 (H) 03/08/2018   HGB 10.3 (L) 03/08/2018   HCT 31.6 (L) 03/08/2018   PLT 103 (L) 03/08/2018   GLUCOSE 108 (H) 03/08/2018   ALT 18 03/04/2018   AST 18 03/04/2018   NA 137 03/08/2018   K 3.9 03/08/2018     CL 101 03/08/2018   CREATININE 0.70 03/08/2018   BUN 14 03/08/2018   CO2 26 03/08/2018   TSH 0.853 03/04/2018   INR 1.07 03/04/2018   HGBA1C 5.5 03/08/2018   Early postop Still on vent Moving bothe sides equally Not bleeding  Delight OvensEdward B Reka Wist MD  [facility] [phone] Office [phone] 03/08/2018 7:00 PM

## 2018-03-08 NOTE — Interval H&P Note (Signed)
History and Physical Interval Note:  03/08/2018 7:51 AM  Zachary Ponce  has presented today for surgery, with the diagnosis of CAD  The various methods of treatment have been discussed with the patient and family. After consideration of risks, benefits and other options for treatment, the patient has consented to  Procedure(s): CORONARY ARTERY BYPASS GRAFTING (CABG) (N/A) TRANSESOPHAGEAL ECHOCARDIOGRAM (TEE) (N/A) ENDARTERECTOMY CAROTID (Left) as a surgical intervention .  The patient's history has been reviewed, patient examined, no change in status, stable for surgery.  I have reviewed the patient's chart and labs.  Questions were answered to the patient's satisfaction.     [name]

## 2018-03-08 NOTE — Anesthesia Procedure Notes (Signed)
Central Venous Catheter Insertion Performed by: Gaynelle AduFitzgerald, Patrich Heinze, MD, anesthesiologist Start/End7/11/2017 7:45 AM, 03/08/2018 8:00 AM Patient location: Pre-op. Preanesthetic checklist: patient identified, IV checked, site marked, risks and benefits discussed, surgical consent, monitors and equipment checked, pre-op evaluation, timeout performed and anesthesia consent Hand hygiene performed  and maximum sterile barriers used  PA cath was placed.Swan type:thermodilution PA Cath depth:50 Procedure performed without using ultrasound guided technique. Attempts: 1 Patient tolerated the procedure well with no immediate complications.

## 2018-03-08 NOTE — Progress Notes (Signed)
Pre Procedure note for inpatients:   Zachary Ponce has been scheduled for Procedure(s): LEFT HEART CATH AND CORONARY ANGIOGRAPHY (N/A) today. The various methods of treatment have been discussed with the patient. After consideration of the risks, benefits and treatment options the patient has consented to the planned procedure.   The patient has been seen and labs reviewed. There are no changes in the patient's condition to prevent proceeding with the planned procedure today.  Recent labs:  Lab Results  Component Value Date   WBC 7.9 03/08/2018   HGB 14.4 03/08/2018   HCT 44.1 03/08/2018   PLT 189 03/08/2018   GLUCOSE 99 03/08/2018   ALT 18 03/04/2018   AST 18 03/04/2018   NA 138 03/08/2018   K 3.8 03/08/2018   CL 102 03/08/2018   CREATININE 0.80 03/08/2018   BUN 17 03/08/2018   CO2 26 03/08/2018   TSH 0.853 03/04/2018   INR 1.07 03/04/2018   HGBA1C 5.5 03/08/2018    Zachary PernaPeter Van Trigt III, MD 03/08/2018 7:09 AM

## 2018-03-08 NOTE — Anesthesia Procedure Notes (Signed)
Procedure Name: Intubation Date/Time: 03/08/2018 8:41 AM Performed by: Mariea Clonts, CRNA Pre-anesthesia Checklist: Patient identified, Emergency Drugs available, Suction available and Patient being monitored Patient Re-evaluated:Patient Re-evaluated prior to induction Oxygen Delivery Method: Circle System Utilized Preoxygenation: Pre-oxygenation with 100% oxygen Induction Type: IV induction Ventilation: Mask ventilation without difficulty Laryngoscope Size: Mac and 4 Grade View: Grade I Tube type: Oral Tube size: 8.0 mm Number of attempts: 1 Airway Equipment and Method: Stylet and Oral airway Placement Confirmation: ETT inserted through vocal cords under direct vision,  positive ETCO2 and breath sounds checked- equal and bilateral Tube secured with: Tape Dental Injury: Teeth and Oropharynx as per pre-operative assessment

## 2018-03-09 ENCOUNTER — Inpatient Hospital Stay (HOSPITAL_COMMUNITY): Payer: Medicare Other

## 2018-03-09 LAB — POCT I-STAT, CHEM 8
BUN: 16 mg/dL (ref 8–23)
Calcium, Ion: 1.14 mmol/L — ABNORMAL LOW (ref 1.15–1.40)
Chloride: 102 mmol/L (ref 98–111)
Creatinine, Ser: 0.6 mg/dL — ABNORMAL LOW (ref 0.61–1.24)
Glucose, Bld: 137 mg/dL — ABNORMAL HIGH (ref 70–99)
HCT: 26 % — ABNORMAL LOW (ref 39.0–52.0)
Hemoglobin: 8.8 g/dL — ABNORMAL LOW (ref 13.0–17.0)
Potassium: 3.8 mmol/L (ref 3.5–5.1)
Sodium: 138 mmol/L (ref 135–145)
TCO2: 22 mmol/L (ref 22–32)

## 2018-03-09 LAB — CREATININE, SERUM
Creatinine, Ser: 0.84 mg/dL (ref 0.61–1.24)
GFR calc Af Amer: >60 mL/min (ref >60)
GFR calc non Af Amer: >60 mL/min (ref >60)

## 2018-03-09 LAB — GLUCOSE, CAPILLARY
Glucose-Capillary: 114 mg/dL — ABNORMAL HIGH (ref 70–99)
Glucose-Capillary: 119 mg/dL — ABNORMAL HIGH (ref 70–99)
Glucose-Capillary: 125 mg/dL — ABNORMAL HIGH (ref 70–99)
Glucose-Capillary: 133 mg/dL — ABNORMAL HIGH (ref 70–99)
Glucose-Capillary: 150 mg/dL — ABNORMAL HIGH (ref 70–99)

## 2018-03-09 LAB — CBC
HCT: 27 % — ABNORMAL LOW (ref 39.0–52.0)
HCT: 29.1 % — ABNORMAL LOW (ref 39.0–52.0)
Hemoglobin: 8.9 g/dL — ABNORMAL LOW (ref 13.0–17.0)
Hemoglobin: 9.3 g/dL — ABNORMAL LOW (ref 13.0–17.0)
MCH: 32.2 pg (ref 26.0–34.0)
MCH: 31.7 pg (ref 26.0–34.0)
MCHC: 33 g/dL (ref 30.0–36.0)
MCHC: 32 g/dL (ref 30.0–36.0)
MCV: 97.8 fL (ref 78.0–100.0)
MCV: 99.3 fL (ref 78.0–100.0)
Platelets: 120 10*3/uL — ABNORMAL LOW (ref 150–400)
Platelets: 122 10*3/uL — ABNORMAL LOW (ref 150–400)
RBC: 2.76 MIL/uL — ABNORMAL LOW (ref 4.22–5.81)
RBC: 2.93 MIL/uL — ABNORMAL LOW (ref 4.22–5.81)
RDW: 13.2 % (ref 11.5–15.5)
RDW: 13.3 % (ref 11.5–15.5)
WBC: 9.9 10*3/uL (ref 4.0–10.5)
WBC: 10.7 10*3/uL — ABNORMAL HIGH (ref 4.0–10.5)

## 2018-03-09 LAB — BASIC METABOLIC PANEL
Anion gap: 5 (ref 5–15)
BUN: 16 mg/dL (ref 8–23)
CO2: 24 mmol/L (ref 22–32)
Calcium: 7.3 mg/dL — ABNORMAL LOW (ref 8.9–10.3)
Chloride: 107 mmol/L (ref 98–111)
Creatinine, Ser: 0.78 mg/dL (ref 0.61–1.24)
GFR calc Af Amer: >60 mL/min (ref >60)
GFR calc non Af Amer: >60 mL/min (ref >60)
Glucose, Bld: 115 mg/dL — ABNORMAL HIGH (ref 70–99)
Potassium: 4.9 mmol/L (ref 3.5–5.1)
Sodium: 136 mmol/L (ref 135–145)

## 2018-03-09 LAB — MAGNESIUM
Magnesium: 2.4 mg/dL (ref 1.7–2.4)
Magnesium: 2.5 mg/dL — ABNORMAL HIGH (ref 1.7–2.4)

## 2018-03-09 MED ORDER — ENOXAPARIN SODIUM 40 MG/0.4ML ~~LOC~~ SOLN
40.0000 mg | Freq: Every day | SUBCUTANEOUS | Status: DC
  Administered 2018-03-09 – 2018-03-13 (×5): 40 mg via SUBCUTANEOUS
  Filled 2018-03-09 (×5): qty 0.4

## 2018-03-09 NOTE — Progress Notes (Signed)
1 Day Post-Op Procedure(s) (LRB): CORONARY ARTERY BYPASS GRAFTING (CABG) X4, RIGHT SAPHENOUS VEIN HARVEST. LIMA TO LAD, SVG TO OM, SVG TO RAMUS, SVG TO PD. (N/A) TRANSESOPHAGEAL ECHOCARDIOGRAM (TEE) (N/A) ENDARTERECTOMY CAROTID (Left) Subjective:  Feels rough. Did not sleep much.  Objective: Vital signs in last 24 hours: Temp:  [96.3 F (35.7 C)-98.1 F (36.7 C)] 97.9 F (36.6 C) (07/04 0800) Pulse Rate:  [80-90] 80 (07/04 0800) Cardiac Rhythm: Atrial paced (07/04 0800) Resp:  [11-22] 19 (07/04 0800) BP: (69-121)/(49-83) 100/56 (07/04 0800) SpO2:  [99 %-100 %] 100 % (07/04 0800) Arterial Line BP: (77-151)/(40-66) 151/51 (07/04 0800) FiO2 (%):  [40 %-50 %] 40 % (07/03 2108) Weight:  [84.4 kg (186 lb)] 84.4 kg (186 lb) (07/04 0500)  Hemodynamic parameters for last 24 hours: PAP: (20-37)/(8-23) 37/19 CO:  [3.5 L/min-8.2 L/min] 8.2 L/min CI:  [1.8 L/min/m2-4.2 L/min/m2] 4.2 L/min/m2  Intake/Output from previous day: 07/03 0701 - 07/04 0700 In: 5643.9 [I.V.:3833.4; Blood:430; IV Piggyback:1380.5] Out: 4442 [Urine:2770; Drains:22; Blood:1250; Chest Tube:400] Intake/Output this shift: Total I/O In: 101.7 [I.V.:101.7] Out: 170 [Urine:70; Chest Tube:100]  General appearance: alert and cooperative Neurologic: intact Heart: regular rate and rhythm, S1, S2 normal, no murmur, click, rub or gallop Lungs: clear to auscultation bilaterally Extremities: edema mild Wound: dressings dry  Lab Results: Recent Labs    03/08/18 2230 03/09/18 0411  WBC 11.8* 9.9  HGB 9.4* 8.9*  HCT 29.4* 27.0*  PLT 108* 120*   BMET:  Recent Labs    03/08/18 0438  03/08/18 1457  03/08/18 2223 03/08/18 2230 03/09/18 0411  NA 138   < > 137   < > 138  --  136  K 3.8   < > 3.9   < > 4.5  --  4.9  CL 102   < > 101  --   --   --  107  CO2 26  --   --   --   --   --  24  GLUCOSE 99   < > 108*   < > 151*  --  115*  BUN 17   < > 14  --   --   --  16  CREATININE 0.80   < > 0.70  --   --  0.87 0.78   CALCIUM 9.1  --   --   --   --   --  7.3*   < > = values in this interval not displayed.    PT/INR:  Recent Labs    03/08/18 1631  LABPROT 16.9*  INR 1.38   ABG    Component Value Date/Time   PHART 7.307 (L) 03/08/2018 2228   HCO3 22.6 03/08/2018 2228   TCO2 24 03/08/2018 2228   ACIDBASEDEF 4.0 (H) 03/08/2018 2228   O2SAT 99.0 03/08/2018 2228   CBG (last 3)  Recent Labs    03/08/18 2353 03/09/18 0417 03/09/18 0742  GLUCAP 150* 119* 114*   CXR: ok  ECG: sinus, mild diffuse ST elevation suggests pericarditis.  Assessment/Plan: S/P Procedure(s) (LRB): CORONARY ARTERY BYPASS GRAFTING (CABG) X4, RIGHT SAPHENOUS VEIN HARVEST. LIMA TO LAD, SVG TO OM, SVG TO RAMUS, SVG TO PD. (N/A) TRANSESOPHAGEAL ECHOCARDIOGRAM (TEE) (N/A) ENDARTERECTOMY CAROTID (Left)   POD 1 Hemodynamically stable. Wean off neo and milrinone. EF normal. Hold on beta blocker until of vasopressor. Mobilize Diuresis once off vasopressor Glucose under good control and preop Hgb A1c normal so DC CBG's and SSI. d/c tubes/lines Continue foley due to diuresing patient, patient  in ICU and urinary output monitoring See progression orders   LOS: 6 days    Alleen BorneBryan K Ikran Patman 03/09/2018

## 2018-03-09 NOTE — Progress Notes (Signed)
Patient ID: Jocelyn LamerDouglas S Meschke, male   DOB: 02/01/1951, [age] y.o.   MRN: [id number] Comfortable this morning Minimal output from left JP drain.  Dressing dry and intact. Neurologically intact. DC JP drain.  Discussed with patient and wife present.

## 2018-03-09 NOTE — Progress Notes (Signed)
Patient ID: Zachary Ponce, male   DOB: 12/21/1950, 67 y.o.   MRN: [id number] [facility] Evening Rounds:  Hemodynamically stable in sinus rhythm.  Milrinone is off. Good urine output Chest tubes out.  Some nausea today but otherwise feels ok.  BMET    Component Value Date/Time   NA 138 03/09/2018 1700   K 3.8 03/09/2018 1700   CL 102 03/09/2018 1700   CO2 24 03/09/2018 0411   GLUCOSE 137 (H) 03/09/2018 1700   BUN 16 03/09/2018 1700   CREATININE 0.84 03/09/2018 1707   CALCIUM 7.3 (L) 03/09/2018 0411   GFRNONAA >60 03/09/2018 1707   GFRAA >60 03/09/2018 1707   CBC    Component Value Date/Time   WBC 10.7 (H) 03/09/2018 1707   RBC 2.93 (L) 03/09/2018 1707   HGB 9.3 (L) 03/09/2018 1707   HCT 29.1 (L) 03/09/2018 1707   PLT 122 (L) 03/09/2018 1707   MCV 99.3 03/09/2018 1707   MCH 31.7 03/09/2018 1707   MCHC 32.0 03/09/2018 1707   RDW 13.3 03/09/2018 1707   LYMPHSABS 2.9 02/18/2011 1314   MONOABS 0.9 02/18/2011 1314   EOSABS 0.3 02/18/2011 1314   BASOSABS 0.0 02/18/2011 1314   Stable day.

## 2018-03-10 ENCOUNTER — Encounter (HOSPITAL_COMMUNITY): Payer: Self-pay | Admitting: Cardiothoracic Surgery

## 2018-03-10 ENCOUNTER — Inpatient Hospital Stay (HOSPITAL_COMMUNITY): Payer: Medicare Other

## 2018-03-10 DIAGNOSIS — Z951 Presence of aortocoronary bypass graft: Secondary | ICD-10-CM

## 2018-03-10 DIAGNOSIS — I308 Other forms of acute pericarditis: Secondary | ICD-10-CM

## 2018-03-10 DIAGNOSIS — I25110 Atherosclerotic heart disease of native coronary artery with unstable angina pectoris: Secondary | ICD-10-CM

## 2018-03-10 DIAGNOSIS — I9789 Other postprocedural complications and disorders of the circulatory system, not elsewhere classified: Secondary | ICD-10-CM

## 2018-03-10 DIAGNOSIS — I4891 Unspecified atrial fibrillation: Secondary | ICD-10-CM

## 2018-03-10 LAB — TYPE AND SCREEN
ABO/RH(D): O POS
Antibody Screen: NEGATIVE
Unit division: 0
Unit division: 0

## 2018-03-10 LAB — BASIC METABOLIC PANEL
Anion gap: 5 (ref 5–15)
BUN: 16 mg/dL (ref 8–23)
CO2: 27 mmol/L (ref 22–32)
Calcium: 8.1 mg/dL — ABNORMAL LOW (ref 8.9–10.3)
Chloride: 103 mmol/L (ref 98–111)
Creatinine, Ser: 0.81 mg/dL (ref 0.61–1.24)
GFR calc Af Amer: >60 mL/min (ref >60)
GFR calc non Af Amer: >60 mL/min (ref >60)
Glucose, Bld: 128 mg/dL — ABNORMAL HIGH (ref 70–99)
Potassium: 4.2 mmol/L (ref 3.5–5.1)
Sodium: 135 mmol/L (ref 135–145)

## 2018-03-10 LAB — CBC
HCT: 28.9 % — ABNORMAL LOW (ref 39.0–52.0)
Hemoglobin: 8.9 g/dL — ABNORMAL LOW (ref 13.0–17.0)
MCH: 30.9 pg (ref 26.0–34.0)
MCHC: 30.8 g/dL (ref 30.0–36.0)
MCV: 100.3 fL — ABNORMAL HIGH (ref 78.0–100.0)
Platelets: 110 10*3/uL — ABNORMAL LOW (ref 150–400)
RBC: 2.88 MIL/uL — ABNORMAL LOW (ref 4.22–5.81)
RDW: 13.5 % (ref 11.5–15.5)
WBC: 10.6 10*3/uL — ABNORMAL HIGH (ref 4.0–10.5)

## 2018-03-10 LAB — POCT I-STAT, CHEM 8
BUN: 16 mg/dL (ref 8–23)
BUN: 17 mg/dL (ref 8–23)
Calcium, Ion: 1.16 mmol/L (ref 1.15–1.40)
Calcium, Ion: 1.17 mmol/L (ref 1.15–1.40)
Chloride: 97 mmol/L — ABNORMAL LOW (ref 98–111)
Chloride: 97 mmol/L — ABNORMAL LOW (ref 98–111)
Creatinine, Ser: 0.6 mg/dL — ABNORMAL LOW (ref 0.61–1.24)
Creatinine, Ser: 0.7 mg/dL (ref 0.61–1.24)
Glucose, Bld: 141 mg/dL — ABNORMAL HIGH (ref 70–99)
Glucose, Bld: 146 mg/dL — ABNORMAL HIGH (ref 70–99)
HCT: 28 % — ABNORMAL LOW (ref 39.0–52.0)
HCT: 28 % — ABNORMAL LOW (ref 39.0–52.0)
Hemoglobin: 9.5 g/dL — ABNORMAL LOW (ref 13.0–17.0)
Hemoglobin: 9.5 g/dL — ABNORMAL LOW (ref 13.0–17.0)
Potassium: 4 mmol/L (ref 3.5–5.1)
Potassium: 4 mmol/L (ref 3.5–5.1)
Sodium: 134 mmol/L — ABNORMAL LOW (ref 135–145)
Sodium: 134 mmol/L — ABNORMAL LOW (ref 135–145)
TCO2: 24 mmol/L (ref 22–32)
TCO2: 24 mmol/L (ref 22–32)

## 2018-03-10 LAB — BPAM RBC
Blood Product Expiration Date: 201907092359
Blood Product Expiration Date: 201907092359
Unit Type and Rh: 5100
Unit Type and Rh: 5100

## 2018-03-10 MED ORDER — METOCLOPRAMIDE HCL 5 MG/ML IJ SOLN: 10.0000 mg | Freq: Four times a day (QID) | INTRAMUSCULAR | Status: DC

## 2018-03-10 MED ORDER — AMIODARONE LOAD VIA INFUSION
150.0000 mg | Freq: Once | INTRAVENOUS | Status: AC
  Administered 2018-03-10: 150 mg via INTRAVENOUS
  Filled 2018-03-10: qty 83.34

## 2018-03-10 MED ORDER — DILTIAZEM HCL-DEXTROSE 100-5 MG/100ML-% IV SOLN (PREMIX)
5.0000 mg/h | INTRAVENOUS | Status: DC
  Administered 2018-03-10: 5 mg/h via INTRAVENOUS
  Filled 2018-03-10: qty 100

## 2018-03-10 MED ORDER — MIDODRINE HCL 5 MG PO TABS
5.0000 mg | ORAL_TABLET | Freq: Three times a day (TID) | ORAL | Status: DC
  Administered 2018-03-10 – 2018-03-13 (×10): 5 mg via ORAL
  Filled 2018-03-10 (×11): qty 1

## 2018-03-10 MED ORDER — AMIODARONE HCL IN DEXTROSE 360-4.14 MG/200ML-% IV SOLN
30.0000 mg/h | INTRAVENOUS | Status: DC
  Administered 2018-03-10 – 2018-03-11 (×2): 30 mg/h via INTRAVENOUS
  Filled 2018-03-10 (×2): qty 200

## 2018-03-10 MED ORDER — ALPRAZOLAM 0.5 MG PO TABS
0.5000 mg | ORAL_TABLET | Freq: Every evening | ORAL | Status: DC | PRN
  Administered 2018-03-15: 0.5 mg via ORAL
  Filled 2018-03-10: qty 1

## 2018-03-10 MED ORDER — AMIODARONE HCL IN DEXTROSE 360-4.14 MG/200ML-% IV SOLN
INTRAVENOUS | Status: AC
  Administered 2018-03-10: 150 mg via INTRAVENOUS
  Filled 2018-03-10: qty 200

## 2018-03-10 MED ORDER — AMIODARONE HCL IN DEXTROSE 360-4.14 MG/200ML-% IV SOLN
60.0000 mg/h | INTRAVENOUS | Status: AC
  Filled 2018-03-10: qty 200

## 2018-03-10 NOTE — Progress Notes (Signed)
..  EKG CRITICAL VALUE     12 lead EKG performed.  Critical value noted at 9:40am Marlane HatcherPatty Keaton, RN notified.   Abbe AmsterdamJessica L Kimberly Coye, CCT 03/10/2018 9:44 AM

## 2018-03-10 NOTE — Op Note (Signed)
NAME: Zachary Ponce, Zachary Ponce MEDICAL RECORD ZO:[id number] ACCOUNT [id number] DATE OF BIRTH:1951-04-11 FACILITY: [facility] LOCATION: [facility] PHYSICIAN:PETER VAN TRIGT III, MD  OPERATIVE REPORT  DATE OF PROCEDURE:  03/10/2018  PROCEDURE PERFORMED: 1.  Coronary artery bypass grafting x4 (left internal mammary artery to left anterior descending, saphenous vein graft to ramus intermedius, saphenous vein graft to obtuse marginal, saphenous vein graft to posterior descending). 2.  Endoscopic harvest of right leg greater saphenous vein.  SURGEON:  [name], MD  ASSISTANT:  Lowella Dandy, PA-C  ANESTHESIA:  General by Leslye Peer, MD  CLINICAL NOTE:  The patient is a 67 year old, nonsmoker with positive family history of CAD, who presents with symptoms of unstable angina and was documented to have severe 3-vessel coronary disease by cardiac catheterization.  LV function was fairly  well preserved.  Echocardiogram showed no significant valvular disease.  Preoperative evaluation for CABG included carotid Dopplers which showed bilateral carotid artery stenosis with left carotid stenosis greater than 90%.  A vascular surgical  evaluation was obtained and combined left carotid endarterectomy with CABG was recommended.  I discussed the procedure of CABG with the patient and family including the major details of surgery to include the location of the surgical incisions, the use of general anesthesia and cardiopulmonary bypass, and the expected postoperative hospital  recovery.  I discussed with the patient the risks to him of coronary bypass surgery including the risk of stroke, bleeding, blood transfusion, postoperative arrhythmias, postoperative pulmonary problems including pleural effusion, postoperative  infection, postoperative organ failure, and postoperative death.  The patient understood that he would have a carotid endarterectomy performed first by Dr. Elise Benne followed by the heart procedure.   After reviewing all of these issues, he  demonstrated his understanding and agreed to proceed with surgery under what I felt was an informed consent.  OPERATIVE FINDINGS: 1.  Adequate conduit. 2.  Severe coronary artery disease, but adequate targets. 3.  Preserved left ventricular systolic function. 4.  No blood products required for this procedure. 5. Prior to sternotomy the patient had Left carotid endarterectomy by Dr    Darrick Penna- dictated in separate note DESCRIPTION OF PROCEDURE:  The patient was brought to the operating room and placed supine on the operating table where general anesthesia was induced under invasive hemodynamic monitoring.  A transesophageal echo probe was placed by the anesthesia team.   The chest, abdomen and legs were prepped with Betadine and draped as a sterile field.  A proper time-out was performed.  A sternal incision was made as the saphenous vein was harvested endoscopically from the right leg.  The left internal mammary  artery was harvested as a pedicle graft from its origin at the subclavian vessels.  It was 1.5 mm vessel with good flow.  The sternal retractor was placed and the pericardium was opened.  Pursestrings were placed in the ascending aorta and right atrium  and after heparin was administered and the ACT was documented as being therapeutic, the patient was cannulated and placed on bypass.  The coronaries were identified for grafting and the mammary artery and vein grafts were prepared for the distal  anastomoses.  Cardioplegic cannulas were placed both antegrade and retrograde cold blood cardioplegia, and the patient was cooled to 34 degrees.  The crossclamp was applied and 1 liter of cold blood cardioplegia was delivered in split doses between the  antegrade aortic and retrograde coronary sinus catheters.  There was good cardioplegic arrest and supple temperature dropped less than 12 degrees.  Cardioplegia was delivered every 20 minutes.  The distal  coronary anastomoses were performed.  The first distal anastomosis was the posterior descending branch of the right coronary artery.  This was a 1.5 mm vessel with a high-grade proximal stenosis.  Reverse saphenous vein was sewn end-to-side  with running 7-0 Prolene with good flow through the graft.  Cardioplegia was redosed.  The second distal anastomosis was the OM branch of the left circumflex.  This was a 1.5 mm vessel high-grade proximal stenosis.  Reverse saphenous vein was sewn end-to-side with running 7-0 Prolene with good flow through the graft.  Cardioplegia was  redosed.  The third distal anastomosis was the ramus intermedius branch of the left coronary.  This was a 1.3 mm vessel, proximal high-grade stenosis.  Reverse saphenous vein was sewn end-to-side with running 7-0 Prolene with good flow through the graft.   Cardioplegia was redosed.  The fourth distal anastomosis was the mid to distal third of the LAD.  It was 1.5 mm vessel, proximal 90% stenosis.  The left internal mammary artery pedicle was brought through an opening in the left lateral pericardium and was brought down onto the LAD  and sewn end-to-side with running 8-0 Prolene.  There was good flow through the anastomosis after briefly releasing the pedicle bulldog and the mammary artery.  The bulldog was reapplied.  The diagonal branch of the LAD was not grafted because it was very small.  The 3 proximal vein anastomoses were then performed on the ascending aorta while the cross clamp was then placed using a 4.5 mm punch and running 6-0 Prolene.  Air was vented from the coronaries with a dose of retrograde warm blood cardioplegia prior to  tying down the final proximal anastomosis.  The crossclamp was removed.  The heart resumed a spontaneous rhythm.  The vein grafts were de-aired and opened and each had good flow.  Hemostasis was documented at the proximal and distal anastomoses.  The patient was rewarmed and reperfused.   Temporary  pacing wires were applied.  The lungs reexpanded.  Ventilator was resumed.  The patient was then weaned off cardiopulmonary bypass on low dose inotropic support without difficulty.  Echocardiogram showed preserved LV function.  Protamine was  administered without adverse reaction.  The cannulas were removed.  The mediastinum was closed over the aorta and vein grafts.  The anterior mediastinal and left pleural chest tubes were placed and brought out through separate incisions.  The sternum was  closed with a wire.  The patient remained stable.  The pectoralis fascia was closed with a running #1 Vicryl.  The subcutaneous and skin layers were closed using running Vicryl and sterile dressings were applied.  Total cardiopulmonary bypass time was  120 minutes.  TN/NUANCE  D:03/10/2018 T:03/10/2018 JOB:[id number]

## 2018-03-10 NOTE — Progress Notes (Signed)
2 Days Post-Op Procedure(s) (LRB): CORONARY ARTERY BYPASS GRAFTING (CABG) X4, RIGHT SAPHENOUS VEIN HARVEST. LIMA TO LAD, SVG TO OM, SVG TO RAMUS, SVG TO PD. (N/A) TRANSESOPHAGEAL ECHOCARDIOGRAM (TEE) (N/A) ENDARTERECTOMY CAROTID (Left) Subjective: Dizziness, nausea,weak,controlled afib, insomnia Neuro intact Objective: Vital signs in last 24 hours: Temp:  [97.5 F (36.4 C)-98 F (36.7 C)] 98 F (36.7 C) (07/05 0026) Pulse Rate:  [50-119] 82 (07/05 0900) Cardiac Rhythm: Atrial fibrillation (07/05 0739) Resp:  [12-30] 19 (07/05 0900) BP: (91-163)/(47-75) 116/70 (07/05 0900) SpO2:  [88 %-100 %] 88 % (07/05 0800) Weight:  [187 lb 9.8 oz (85.1 kg)] 187 lb 9.8 oz (85.1 kg) (07/05 0500)  Hemodynamic parameters for last 24 hours:  stable  Intake/Output from previous day: 07/04 0701 - 07/05 0700 In: 1167.9 [P.O.:440; I.V.:527.7; IV Piggyback:200.1] Out: 1070 [Urine:920; Chest Tube:150] Intake/Output this shift: Total I/O In: 118.8 [I.V.:118.8] Out: 50 [Urine:50]      Physical Exam  General: responsive HEENT: Normocephalic pupils equal , dentition adequate Neck: Supple without JVD, adenopathy, or bruit Chest: Clear to auscultation, symmetrical breath sounds, no rhonchi, no tenderness             or deformity Cardiovascular: irregular hythm, no murmur, no gallop, peripheral pulses             palpable in all extremities Abdomen:  Soft, nontender, no palpable mass or organomegaly Extremities: Warm, well-perfused, no clubbing cyanosis edema or tenderness,              no venous stasis changes of the legs Rectal/GU: Deferred Neuro: Grossly non--focal and symmetrical throughout Skin: Clean and dry without rash or ulceration    Lab Results: Recent Labs    03/09/18 1707 03/10/18 0344  WBC 10.7* 10.6*  HGB 9.3* 8.9*  HCT 29.1* 28.9*  PLT 122* 110*   BMET:  Recent Labs    03/09/18 0411 03/09/18 1700 03/09/18 1707 03/10/18 0344  NA 136 138  --  135  K 4.9 3.8  --  4.2   CL 107 102  --  103  CO2 24  --   --  27  GLUCOSE 115* 137*  --  128*  BUN 16 16  --  16  CREATININE 0.78 0.60* 0.84 0.81  CALCIUM 7.3*  --   --  8.1*    PT/INR:  Recent Labs    03/08/18 1631  LABPROT 16.9*  INR 1.38   ABG    Component Value Date/Time   PHART 7.307 (L) 03/08/2018 2228   HCO3 22.6 03/08/2018 2228   TCO2 22 03/09/2018 1700   ACIDBASEDEF 4.0 (H) 03/08/2018 2228   O2SAT 99.0 03/08/2018 2228   CBG (last 3)  Recent Labs    03/09/18 0742 03/09/18 1210 03/09/18 1557  GLUCAP 114* 125* 133*    Assessment/Plan: S/P Procedure(s) (LRB): CORONARY ARTERY BYPASS GRAFTING (CABG) X4, RIGHT SAPHENOUS VEIN HARVEST. LIMA TO LAD, SVG TO OM, SVG TO RAMUS, SVG TO PD. (N/A) TRANSESOPHAGEAL ECHOCARDIOGRAM (TEE) (N/A) ENDARTERECTOMY CAROTID (Left) Mobilize Diuresis amiodarone and iv cardizem for afib    May not tolerate amiodarone   LOS: 7 days    Kathlee Nationseter Van Trigt III 03/10/2018

## 2018-03-10 NOTE — Discharge Summary (Signed)
Physician Discharge Summary  Patient ID: Zachary Ponce MRN: [id number] DOB/AGE: 04/14/1951 67 y.o.  Admit date: 03/03/2018 Discharge date: 03/16/2018  Admission Diagnoses:  Patient Active Problem List   Diagnosis Date Noted  . Bilateral carotid artery disease (HCC) 03/07/2018  . CAD (coronary artery disease) 03/03/2018  . ADHD (attention deficit hyperactivity disorder) 03/03/2018  . Hyperlipidemia 03/03/2018  . Abnormal stress test   . Exertional angina (HCC)   . Depression 06/30/2016   Discharge Diagnoses:   Patient Active Problem List   Diagnosis Date Noted  . 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 Bilateral carotid artery disease (HCC) 03/07/2018  . CAD (coronary artery disease) 03/03/2018  . ADHD (attention deficit hyperactivity disorder) 03/03/2018  . Hyperlipidemia 03/03/2018  . Abnormal stress test   . Exertional angina (HCC)   . Depression 06/30/2016   Discharged Condition: good  History of Present Illness:  Mr. Zachary Ponce is a 67 yo male with known history of hyperlipidemia and hypertension.  He presented with a 6 month complaint of exertional dyspnea that has gotten progressively worsened in frequency.  He also been more fatigued than normal.  He denied chest pain.  He underwent stress testing which was abnormal.  Subsequent cardiac catheterization was performed and showed severe multivessel CAD with LM involvement.  It was felt the patient would require bypass surgery.  He was admitted and TCTS consult was requested.    Hospital Course:   He was chest pain free during hospitalization.  He was evaluated by Dr. Donata ClayVan Trigt who was in agreement the patient would require coronary bypass procedure.  The risks and benefits of the procedure were explained to the patient and he was agreeable to proceed.  His preoperative workup revealed significant carotid disease bilaterally.  Vascular surgery consult was obtained and they recommended the patient undergoing carotid  endarterectomy on the left at the time of his bypass procedure.  The patient was taken to the operating room and underwent CABG x 4 utilizing LIMA to LAD, SVG to PDA, SVG to OM, and SVG to Ramus Intermediate.  He underwent Left Cartoid Endarterectomy and endoscopic harvest of greater saphenous vein from his right leg.  He tolerated the procedure without difficulty and was taken to the SICU in stable condition.  He was extubated the evening of surgery.  He was weaned of Neo-synephrine and Milrinone as tolerated.  He was weaned off the Insulin drip. His pre op HGA1C was 5.5. His left neck JP drain, chest tubes and arterial lines were removed without difficulty.  He developed atrial fibrillation and was started on IV Amiodarone.  He did not convert initially and was started on IV Cardizem for additional rate control.  He then converted to sinus rhythm and was continue on oral Amiodarone. He is on Midodrine 5 mg tid for hypotension. He was felt surgically stable for transfer from the ICU to [facility] for further convalescence on 03/12/2018. He is ambulating on room air. He is tolerating a diet and has had a bowel movement. His incisions are clean and dry and there is no sign of infection. Epicardial pacing wires were removed on 03/14/2018. As discussed with Dr. Donata ClayVan Trigt,  Apixaban (for PAF) was started on 07/10,   ecasa was decreased , and Lovenox was stopped. He was also started on Atorvastatin for lipid control. Patient is felt surgically stable for discharge today.  Consults: vascular surgery  Significant Diagnostic Studies: angiography:    Ost RCA lesion is  75% stenosed.  Mid RCA-1 lesion is 80% stenosed.  Mid RCA-2 lesion is 50% stenosed.  Dist RCA lesion is 50% stenosed.  RPDA lesion is 70% stenosed.  Mid LM lesion is 75% stenosed.  Ost Ramus to Ramus lesion is 60% stenosed.  Prox LAD lesion is 95% stenosed.  Ost 1st Mrg to 1st Mrg lesion is 20% stenosed.  Ost Cx to Prox Cx lesion is 30%  stenosed.  The left ventricular ejection fraction is 50-55% by visual estimate.   Multivessel coronary obstructive disease with calcification involving the distal left main extending into the ostium of the LAD with 75% distal left main stenosis; 95% proximal LAD stenosis immediately proximal to the diagonal 1 takeoff; 60% diffuse proximal ramus intermediate stenosis; 30% proximal circumflex with 20% OM1 smooth stenosis; and a 75% ostial RCA associated with catheter dampening in a mildly calcified RCA with 80% mid stenosis followed by segmental 50% stenoses before the PDA takeoff and 70% mid PDA stenosis.  Low normal LV function with an ejection fraction of 50 to 55% and evidence for a mild mid anterolateral focal region of hypo-kinesis.  Treatments: surgery:   CORONARY ARTERY BYPASS GRAFTING x 4 -LIMA to LAD -SVG to PDA -SVG to OM -SVG to RAMUS INTERMEDIATE  ENDOSCOPIC HARVEST GREATER SAPHENOUS VEIN -Right Leg  TRANSESOPHAGEAL ECHOCARDIOGRAM (TEE) (N/A)  ENDARTERECTOMY CAROTID (Left)    Discharge Exam: Blood pressure 100/62, pulse (!) 125, temperature 97.9 F (36.6 C), temperature source Oral, resp. rate (!) 21, height 5\' 10"  (1.778 m), weight 169 lb 12.1 oz (77 kg), SpO2 95 %. Cardiovascular: RRR Pulmonary: Clear to auscultation bilaterally Abdomen: Soft, non tender, bowel sounds present. Extremities: No lower extremity edema. Trace ecchymosis right thigh. Wounds: Clean and dry.  No erythema or signs of infection.  Discharge disposition: 01-Home or Self Care   Discharge Instructions    Amb Referral to Cardiac Rehabilitation   Complete by:  As directed    Diagnosis:  CABG   CABG X ___:  4 Comment - left carotid endarct     Allergies as of 03/16/2018   No Known Allergies     Medication List    STOP taking these medications   propranolol ER 120 MG 24 hr capsule Commonly known as:  INDERAL LA     TAKE these medications   acetaminophen 325 MG tablet Commonly  known as:  TYLENOL Take 2 tablets (650 mg total) by mouth every 6 (six) hours as needed for mild pain.   amiodarone 200 MG tablet Commonly known as:  PACERONE Take 1 tablet (200 mg total) by mouth 2 (two) times daily.   apixaban 5 MG Tabs tablet Commonly known as:  ELIQUIS Take 1 tablet (5 mg total) by mouth 2 (two) times daily.   aspirin 81 MG EC tablet Take 1 tablet (81 mg total) by mouth daily.   atorvastatin 20 MG tablet Commonly known as:  LIPITOR Take 1 tablet (20 mg total) by mouth daily at 6 PM.   buPROPion 300 MG 24 hr tablet Commonly known as:  WELLBUTRIN XL Take 300 mg by mouth daily.   escitalopram 5 MG tablet Commonly known as:  LEXAPRO Take 5 mg by mouth daily.   furosemide 40 MG tablet Commonly known as:  LASIX Take 1 tablet (40 mg total) by mouth daily. X 7 days   metoprolol tartrate 25 MG tablet Commonly known as:  LOPRESSOR Take 0.5 tablets (12.5 mg total) by mouth 2 (two) times daily.   midodrine 5  MG tablet Commonly known as:  PROAMATINE Take 1 tablet (5 mg total) by mouth 2 (two) times daily with a meal.   NON FORMULARY Apply 50 mg topically daily. Testosterone 50mg /ml   potassium chloride SA 20 MEQ tablet Commonly known as:  K-DUR,KLOR-CON Take 1 tablet (20 mEq total) by mouth daily. X 7 days   traMADol 50 MG tablet Commonly known as:  ULTRAM Take 1 tablet (50 mg total) by mouth every 4 (four) hours as needed for moderate pain.   VYVANSE 30 MG capsule Generic drug:  lisdexamfetamine Take 30 mg by mouth daily.     The patient has been discharged on:   1.Beta Blocker:  Yes [ x  ]                              No   [   ]                              If No, reason:  2.Ace Inhibitor/ARB: Yes [   ]                                     No  [ x   ]                                     If No, reason: Labile BP  3.Statin:   Yes [  x ]                  No  [   ]                  If No, reason:  4.Ecasa:  Yes  [ x  ]                  No   [    ]                  If No, reason:  Follow-up Information    [name], MD Follow up on 04/12/2018.   Specialty:  Cardiothoracic Surgery Why:  Appointment is at 1:30, please get CXR at 1:00 at [facility] located on the first floor of our office building  Contact information: [address] [place] [address] [phone]        Othella Boyer, MD Follow up on 03/23/2018.   Specialty:  Cardiology Why:  Appointment is at 2:00 Contact information: [address] [place] [address] [phone]        [name], MD. Go on 04/06/2018.   Specialties:  Vascular Surgery, Cardiology Why:  Appointment time is at 3:45 pm Contact information: [address] [place] [address] [phone]        [facility] Follow up.   Specialty:  Home Health Services Why:  HHRN/PT arranged - they will call you to set up home visits Contact information: [address] [place] [address] [phone]           Signed: Lowella Dandy PA-C 03/16/2018, 7:51 AM

## 2018-03-10 NOTE — Progress Notes (Signed)
Pt went back into Afib upon standing around 0600 this AM. Became acutely dizzy, lightheaded and nauseous, said "I feel like I'm going to pass out". VSS throughout. SBP >100 throughout. Got back into bed, dizziness eased off. Will continue to monitor.

## 2018-03-10 NOTE — Progress Notes (Signed)
Patient ID: Zachary LamerDouglas S Haliburton, male   DOB: 1951/08/26, 67 y.o.   MRN: [id number] TCTS Evening Rounds:  Hemodynamically stable Back in sinus rhythm 74 on IV amio. Cardizem is off Urine output ok BMET    Component Value Date/Time   NA 134 (L) 03/10/2018 1600   K 4.0 03/10/2018 1600   CL 97 (L) 03/10/2018 1600   CO2 27 03/10/2018 0344   GLUCOSE 146 (H) 03/10/2018 1600   BUN 17 03/10/2018 1600   CREATININE 0.60 (L) 03/10/2018 1600   CALCIUM 8.1 (L) 03/10/2018 0344   GFRNONAA >60 03/10/2018 0344   GFRAA >60 03/10/2018 0344   CBC    Component Value Date/Time   WBC 10.6 (H) 03/10/2018 0344   RBC 2.88 (L) 03/10/2018 0344   HGB 9.5 (L) 03/10/2018 1600   HCT 28.0 (L) 03/10/2018 1600   PLT 110 (L) 03/10/2018 0344   MCV 100.3 (H) 03/10/2018 0344   MCH 30.9 03/10/2018 0344   MCHC 30.8 03/10/2018 0344   RDW 13.5 03/10/2018 0344   LYMPHSABS 2.9 02/18/2011 1314   MONOABS 0.9 02/18/2011 1314   EOSABS 0.3 02/18/2011 1314   BASOSABS 0.0 02/18/2011 1314

## 2018-03-10 NOTE — Plan of Care (Signed)
C/o dizziness with movement, not able to ambulate at this time

## 2018-03-10 NOTE — Progress Notes (Signed)
[facility]  Subjective  - nausea   Objective 131/68 88 97.7 F (36.5 C) (Oral) 16 94%  Intake/Output Summary (Last 24 hours) at 03/10/2018 1646 Last data filed at 03/10/2018 1600 Gross per 24 hour  Intake 1620.54 ml  Output 1060 ml  Net 560.54 ml   Left neck incision healing no hematoma UE/LE 5/5 motor symmetric No tongue deviation or facial asymmetry   Assessment/Planning: Doing well post CEA no swallowing difficulty but persistant nausea Hopefully resolve over next day or so Will schedule follow up in few weeks to discuss interval right CEA call if questions  Fabienne BrunsCharles Chesney Klimaszewski 03/10/2018 4:46 PM --  Laboratory Lab Results: Recent Labs    03/09/18 1707 03/10/18 0344 03/10/18 1544 03/10/18 1600  WBC 10.7* 10.6*  --   --   HGB 9.3* 8.9* 9.5* 9.5*  HCT 29.1* 28.9* 28.0* 28.0*  PLT 122* 110*  --   --    BMET Recent Labs    03/09/18 0411  03/10/18 0344 03/10/18 1544 03/10/18 1600  NA 136   < > 135 134* 134*  K 4.9   < > 4.2 4.0 4.0  CL 107   < > 103 97* 97*  CO2 24  --  27  --   --   GLUCOSE 115*   < > 128* 141* 146*  BUN 16   < > 16 16 17   CREATININE 0.78   < > 0.81 0.70 0.60*  CALCIUM 7.3*  --  8.1*  --   --    < > = values in this interval not displayed.    COAG Lab Results  Component Value Date   INR 1.38 03/08/2018   INR 1.07 03/04/2018   INR 0.96 02/18/2011   No results found for: PTT

## 2018-03-10 NOTE — Progress Notes (Signed)
Progress Note  Patient Name: Zachary Ponce Date of Encounter: 03/10/2018  Primary Cardiologist: Georga Hacking, MD   Subjective   Combined left carotid endarterectomy and CABG (able to LAD, SVG to OM, SVG to ramus, and SVG to PDA).  Developed A. fib within the past 24 hours.  On IV amiodarone.  Was symptomatic when rate was really fast.  Feels he was unable to sleep last night.  Inpatient Medications    Scheduled Meds: . acetaminophen  1,000 mg Oral Q6H   Or  . acetaminophen (TYLENOL) oral liquid 160 mg/5 mL  1,000 mg Per Tube Q6H  . aspirin EC  325 mg Oral Daily   Or  . aspirin  324 mg Per Tube Daily  . atorvastatin  80 mg Oral q1800  . bisacodyl  10 mg Oral Daily   Or  . bisacodyl  10 mg Rectal Daily  . buPROPion  300 mg Oral Daily  . docusate sodium  200 mg Oral Daily  . enoxaparin (LOVENOX) injection  40 mg Subcutaneous QHS  . escitalopram  5 mg Oral Daily  . lisdexamfetamine  30 mg Oral Daily  . metoCLOPramide (REGLAN) injection  10 mg Intravenous Q6H  . pantoprazole  40 mg Oral Daily  . sodium chloride flush  3 mL Intravenous Q12H   Continuous Infusions: . sodium chloride Stopped (03/08/18 2102)  . sodium chloride    . sodium chloride    . amiodarone 60 mg/hr (03/10/18 0900)   Followed by  . amiodarone    . cefUROXime (ZINACEF)  IV Stopped (03/09/18 2206)  . diltiazem (CARDIZEM) infusion    . lactated ringers 20 mL/hr (03/08/18 1630)  . lactated ringers 10 mL/hr at 03/10/18 0900  . phenylephrine (NEO-SYNEPHRINE) Adult infusion Stopped (03/09/18 0850)   PRN Meds: sodium chloride, metoprolol tartrate, morphine injection, ondansetron (ZOFRAN) IV, oxyCODONE, sodium chloride flush, traMADol   Vital Signs    Vitals:   03/10/18 0730 03/10/18 0745 03/10/18 0800 03/10/18 0900  BP:   139/75 116/70  Pulse: (!) 105 99 84 82  Resp: 20 20 (!) 24 19  Temp:      TempSrc:      SpO2: 99% 100% (!) 88%   Weight:      Height:        Intake/Output Summary  (Last 24 hours) at 03/10/2018 0908 Last data filed at 03/10/2018 0900 Gross per 24 hour  Intake 1073.47 ml  Output 800 ml  Net 273.47 ml   Filed Weights   03/08/18 0458 03/09/18 0500 03/10/18 0500  Weight: 171 lb 1.6 oz (77.6 kg) 186 lb (84.4 kg) 187 lb 9.8 oz (85.1 kg)    Telemetry    A. fib with moderate rate control between 90 and 110 bpm- Personally Reviewed  ECG    03/09/2018 EKG demonstrated diffuse ST elevation and sinus bradycardia.- Personally Reviewed  Physical Exam  Lying flat.  No acute distress.  Chest tubes have been removed.  Still has Foley. GEN: No acute distress.   Neck: No JVD Cardiac: IIRR, no murmurs, rubs, or gallops.  Respiratory: Clear to auscultation bilaterally. GI: Soft, nontender, non-distended  MS: No edema; No deformity. Neuro:  Nonfocal  Psych: Normal affect   Labs    Chemistry Recent Labs  Lab 03/04/18 0509  03/08/18 0438  03/09/18 0411 03/09/18 1700 03/09/18 1707 03/10/18 0344  NA 139   < > 138   < > 136 138  --  135  K 4.7   < >  3.8   < > 4.9 3.8  --  4.2  CL 106   < > 102   < > 107 102  --  103  CO2 29   < > 26  --  24  --   --  27  GLUCOSE 102*   < > 99   < > 115* 137*  --  128*  BUN 13   < > 17   < > 16 16  --  16  CREATININE 0.95   < > 0.80   < > 0.78 0.60* 0.84 0.81  CALCIUM 8.4*   < > 9.1  --  7.3*  --   --  8.1*  PROT 5.4*  --   --   --   --   --   --   --   ALBUMIN 3.1*  --   --   --   --   --   --   --   AST 18  --   --   --   --   --   --   --   ALT 18  --   --   --   --   --   --   --   ALKPHOS 82  --   --   --   --   --   --   --   BILITOT 0.9  --   --   --   --   --   --   --   GFRNONAA >60   < > >60   < > >60  --  >60 >60  GFRAA >60   < > >60   < > >60  --  >60 >60  ANIONGAP 4*   < > 10  --  5  --   --  5   < > = values in this interval not displayed.     Hematology Recent Labs  Lab 03/09/18 0411 03/09/18 1700 03/09/18 1707 03/10/18 0344  WBC 9.9  --  10.7* 10.6*  RBC 2.76*  --  2.93* 2.88*  HGB 8.9* 8.8*  9.3* 8.9*  HCT 27.0* 26.0* 29.1* 28.9*  MCV 97.8  --  99.3 100.3*  MCH 32.2  --  31.7 30.9  MCHC 33.0  --  32.0 30.8  RDW 13.2  --  13.3 13.5  PLT 120*  --  122* 110*    Cardiac EnzymesNo results for input(s): TROPONINI in the last 168 hours. No results for input(s): TROPIPOC in the last 168 hours.   BNPNo results for input(s): BNP, PROBNP in the last 168 hours.   DDimer No results for input(s): DDIMER in the last 168 hours.   Radiology    Dg Chest Port 1 View  Result Date: 03/10/2018 CLINICAL DATA:  CABG.  Sore chest. EXAM: PORTABLE CHEST 1 VIEW COMPARISON:  03/09/2018. FINDINGS: Right IJ sheath in stable position. Interim removal of Swan-Ganz catheter, NG tube, and left chest tube. No pneumothorax. Prior CABG. Stable cardiomegaly. Low volumes. Progressive bibasilar atelectasis/infiltrates. Calcified nodule right upper lung consistent with granuloma. Small bilateral pleural effusions. IMPRESSION: 1. Right IJ sheath in stable position. Interim removal of Swan-Ganz catheter, NG tube, left chest tube. No pneumothorax. 2.  Prior CABG.  Stable cardiomegaly. 3. Low lung volumes. Progressive bibasilar atelectasis/infiltrates. Small bilateral pleural effusions. Electronically Signed   By: Maisie Fushomas  Register   On: 03/10/2018 06:25   Dg Chest Port 1 View  Result Date: 03/09/2018 CLINICAL DATA:  Followup endarterectomy and CABG. EXAM: PORTABLE CHEST 1 VIEW COMPARISON:  03/08/2018 FINDINGS: Endotracheal tube and nasogastric tube have been removed. Swan-Ganz catheter and mediastinal drain remain in place. Left chest tube remains in place. There is no pneumothorax. There is minimal basilar atelectasis. Calcified granulomas on the right again seen. IMPRESSION: Extubated. Only minimal basilar atelectasis. No visible pneumothorax. Electronically Signed   By: Paulina Fusi M.D.   On: 03/09/2018 07:11   Dg Chest Port 1 View  Result Date: 03/08/2018 CLINICAL DATA:  Status post CABG. EXAM: PORTABLE CHEST 1 VIEW  COMPARISON:  Chest x-ray dated February 28, 2018. FINDINGS: Endotracheal tube in position with the tip 2.6 cm above the level of the carina. Enteric tube with tip near the gastroesophageal junction. Right internal jugular Swan-Ganz catheter with the tip in the main pulmonary outflow tract. Mediastinal and left chest tubes are in good position. Interval CABG. Normal heart size. The lungs are essentially clear. No focal consolidation, pleural effusion, or pneumothorax. Unchanged calcified granuloma in the right upper lobe. No acute osseous abnormality. IMPRESSION: 1. Enteric tube with tip near the gastroesophageal junction. Recommend advancement. 2. Other support tubes are appropriately positioned. 3. Interval CABG.  No active cardiopulmonary disease. Electronically Signed   By: Obie Dredge M.D.   On: 03/08/2018 17:05    Cardiac Studies   No new cardiac graphic data.  Patient Profile     67 y.o. male with history of hyperlipidemia, carotid disease, and multivessel disease noted on cath after developing chest pain.  Underwent coronary bypass grafting with LIMA to LAD, SVG to ramus, SVG to obtuse marginal, and SVG to PDA.  Had concomitant carotid endarterectomy.  Postop day 2 developed atrial fibrillation.  EKG demonstrates ST elevation compatible with pericarditis.  Assessment & Plan  1. Postop pericarditis we will repeat EKG today.  Had ST elevation yesterday. 2. Atrial fibrillation with RVR.  Now on IV amiodarone. 3. Status post left carotid endarterectomy 4. Status post CABG.   Continue IV amiodarone.  Repeat EKG today.  Will follow.  No specific actionable recommendations from [place].   For questions or updates, please contact [facility] Please consult [email or web address] for contact info under Cardiology/STEMI.      Signed, Lesleigh Noe, MD  03/10/2018, 9:08 AM

## 2018-03-10 NOTE — Progress Notes (Signed)
  Progress Note    03/10/2018 9:40 AM 2 Days Post-Op  Subjective:  Denies stroke like symptoms including slurring speech, changes in vision, or one sided weakness   Vitals:   03/10/18 0800 03/10/18 0900  BP: 139/75 116/70  Pulse: 84 82  Resp: (!) 24 19  Temp:    SpO2: (!) 88%    Physical Exam: Lungs:  Non labored Incisions:  L neck incision without hematoma or drainage; drain site unremarkable Extremities:  Moving all extremities well; grip strength symmetrical Abdomen:  Soft Neurologic: A&O; L lower lip mild weakness  CBC    Component Value Date/Time   WBC 10.6 (H) 03/10/2018 0344   RBC 2.88 (L) 03/10/2018 0344   HGB 8.9 (L) 03/10/2018 0344   HCT 28.9 (L) 03/10/2018 0344   PLT 110 (L) 03/10/2018 0344   MCV 100.3 (H) 03/10/2018 0344   MCH 30.9 03/10/2018 0344   MCHC 30.8 03/10/2018 0344   RDW 13.5 03/10/2018 0344   LYMPHSABS 2.9 02/18/2011 1314   MONOABS 0.9 02/18/2011 1314   EOSABS 0.3 02/18/2011 1314   BASOSABS 0.0 02/18/2011 1314    BMET    Component Value Date/Time   NA 135 03/10/2018 0344   K 4.2 03/10/2018 0344   CL 103 03/10/2018 0344   CO2 27 03/10/2018 0344   GLUCOSE 128 (H) 03/10/2018 0344   BUN 16 03/10/2018 0344   CREATININE 0.81 03/10/2018 0344   CALCIUM 8.1 (L) 03/10/2018 0344   GFRNONAA >60 03/10/2018 0344   GFRAA >60 03/10/2018 0344    INR    Component Value Date/Time   INR 1.38 03/08/2018 1631     Intake/Output Summary (Last 24 hours) at 03/10/2018 0940 Last data filed at 03/10/2018 0900 Gross per 24 hour  Intake 1073.47 ml  Output 850 ml  Net 223.47 ml     Assessment/Plan:  67 y.o. male is s/p Combined left carotid endarterectomy and CABG    2 Days Post-Op   Neurologically intact L neck incision unremarkable   Emilie RutterMatthew Blaike Newburn, PA-C [facility] [phone] 03/10/2018 9:40 AM

## 2018-03-10 NOTE — Progress Notes (Signed)
  Amiodarone Drug - Drug Interaction Consult Note  Recommendations: Escitalopram can prolong the QTc interval. Recommend routine monitoring if patient is discharged on PO amiodarone  Amiodarone is metabolized by the cytochrome P450 system and therefore has the potential to cause many drug interactions. Amiodarone has an average plasma half-life of 50 days (range 20 to 100 days).   There is potential for drug interactions to occur several weeks or months after stopping treatment and the onset of drug interactions may be slow after initiating amiodarone.   []  Statins: Increased risk of myopathy. Simvastatin- restrict dose to 20mg  daily. Other statins: counsel patients to report any muscle pain or weakness immediately.  []  Anticoagulants: Amiodarone can increase anticoagulant effect. Consider warfarin dose reduction. Patients should be monitored closely and the dose of anticoagulant altered accordingly, remembering that amiodarone levels take several weeks to stabilize.  []  Antiepileptics: Amiodarone can increase plasma concentration of phenytoin, the dose should be reduced. Note that small changes in phenytoin dose can result in large changes in levels. Monitor patient and counsel on signs of toxicity.  []  Beta blockers: increased risk of bradycardia, AV block and myocardial depression. Sotalol - avoid concomitant use.  []   Calcium channel blockers (diltiazem and verapamil): increased risk of bradycardia, AV block and myocardial depression.  []   Cyclosporine: Amiodarone increases levels of cyclosporine. Reduced dose of cyclosporine is recommended.  []  Digoxin dose should be halved when amiodarone is started.  []  Diuretics: increased risk of cardiotoxicity if hypokalemia occurs.  []  Oral hypoglycemic agents (glyburide, glipizide, glimepiride): increased risk of hypoglycemia. Patient's glucose levels should be monitored closely when initiating amiodarone therapy.   []  Drugs that prolong the  QT interval:  Torsades de pointes risk may be increased with concurrent use - avoid if possible.  Monitor QTc, also keep magnesium/potassium WNL if concurrent therapy can't be avoided. [name]. Antibiotics: e.g. fluoroquinolones, erythromycin. . Antiarrhythmics: e.g. quinidine, procainamide, disopyramide, sotalol. . Antipsychotics: e.g. phenothiazines, haloperidol.  . Lithium, tricyclic antidepressants, and methadone.  Illyria Sobocinski D. Alayza Pieper, PharmD, BCPS Clinical Pharmacist [phone] Please check AMION for all [facility] numbers 03/10/2018 3:42 AM

## 2018-03-11 ENCOUNTER — Inpatient Hospital Stay (HOSPITAL_COMMUNITY): Payer: Medicare Other

## 2018-03-11 DIAGNOSIS — I300 Acute nonspecific idiopathic pericarditis: Secondary | ICD-10-CM

## 2018-03-11 DIAGNOSIS — I480 Paroxysmal atrial fibrillation: Secondary | ICD-10-CM

## 2018-03-11 LAB — CBC
HCT: 27 % — ABNORMAL LOW (ref 39.0–52.0)
Hemoglobin: 8.4 g/dL — ABNORMAL LOW (ref 13.0–17.0)
MCH: 31.1 pg (ref 26.0–34.0)
MCHC: 31.1 g/dL (ref 30.0–36.0)
MCV: 100 fL (ref 78.0–100.0)
Platelets: 116 10*3/uL — ABNORMAL LOW (ref 150–400)
RBC: 2.7 MIL/uL — ABNORMAL LOW (ref 4.22–5.81)
RDW: 13.6 % (ref 11.5–15.5)
WBC: 9.8 10*3/uL (ref 4.0–10.5)

## 2018-03-11 LAB — BASIC METABOLIC PANEL
Anion gap: 7 (ref 5–15)
BUN: 12 mg/dL (ref 8–23)
CO2: 28 mmol/L (ref 22–32)
Calcium: 8.2 mg/dL — ABNORMAL LOW (ref 8.9–10.3)
Chloride: 101 mmol/L (ref 98–111)
Creatinine, Ser: 0.69 mg/dL (ref 0.61–1.24)
GFR calc Af Amer: >60 mL/min (ref >60)
GFR calc non Af Amer: >60 mL/min (ref >60)
Glucose, Bld: 115 mg/dL — ABNORMAL HIGH (ref 70–99)
Potassium: 3.9 mmol/L (ref 3.5–5.1)
Sodium: 136 mmol/L (ref 135–145)

## 2018-03-11 MED ORDER — FUROSEMIDE 40 MG PO TABS
40.0000 mg | ORAL_TABLET | Freq: Every day | ORAL | Status: DC
  Administered 2018-03-11 – 2018-03-16 (×6): 40 mg via ORAL
  Filled 2018-03-11 (×6): qty 1

## 2018-03-11 MED ORDER — TRAMADOL HCL 50 MG PO TABS: 50.0000 mg | ORAL_TABLET | ORAL | Status: DC | PRN

## 2018-03-11 MED ORDER — POTASSIUM CHLORIDE CRYS ER 20 MEQ PO TBCR
20.0000 meq | EXTENDED_RELEASE_TABLET | Freq: Two times a day (BID) | ORAL | Status: DC
  Administered 2018-03-11 (×2): 20 meq via ORAL
  Filled 2018-03-11 (×2): qty 1

## 2018-03-11 MED ORDER — METOLAZONE 2.5 MG PO TABS
2.5000 mg | ORAL_TABLET | Freq: Once | ORAL | Status: AC
Start: 2018-03-11 — End: 2018-03-11
  Administered 2018-03-11: 2.5 mg via ORAL
  Filled 2018-03-11: qty 1

## 2018-03-11 MED ORDER — AMIODARONE HCL 200 MG PO TABS
200.0000 mg | ORAL_TABLET | Freq: Two times a day (BID) | ORAL | Status: DC
  Administered 2018-03-11 – 2018-03-13 (×5): 200 mg via ORAL
  Filled 2018-03-11 (×5): qty 1

## 2018-03-11 NOTE — Plan of Care (Signed)
  Problem: Health Behavior/Discharge Planning: Goal: Ability to manage health-related needs will improve Outcome: Progressing   Problem: Clinical Measurements: Goal: Ability to maintain clinical measurements within normal limits will improve Outcome: Progressing Goal: Will remain free from infection Outcome: Progressing Goal: Diagnostic test results will improve Outcome: Progressing Goal: Respiratory complications will improve Outcome: Progressing Goal: Cardiovascular complication will be avoided Outcome: Progressing   Problem: Coping: Goal: Level of anxiety will decrease Outcome: Progressing   Problem: Safety: Goal: Ability to remain free from injury will improve Outcome: Progressing   Problem: Skin Integrity: Goal: Risk for impaired skin integrity will decrease Outcome: Progressing   Problem: Education: Goal: Understanding of cardiac disease, CV risk reduction, and recovery process will improve Outcome: Progressing   Problem: Activity: Goal: Ability to tolerate increased activity will improve Outcome: Progressing   Problem: Cardiac: Goal: Ability to achieve and maintain adequate cardiovascular perfusion will improve Outcome: Progressing   Problem: Health Behavior/Discharge Planning: Goal: Ability to safely manage health-related needs after discharge will improve Outcome: Progressing   Problem: Education: Goal: Ability to demonstrate proper wound care will improve Outcome: Progressing Goal: Knowledge of disease or condition will improve Outcome: Progressing Goal: Knowledge of the prescribed therapeutic regimen will improve Outcome: Progressing   Problem: Activity: Goal: Risk for activity intolerance will decrease Outcome: Progressing   Problem: Cardiac: Goal: Hemodynamic stability will improve Outcome: Progressing   Problem: Clinical Measurements: Goal: Postoperative complications will be avoided or minimized Outcome: Progressing   Problem:  Respiratory: Goal: Respiratory status will improve Outcome: Progressing   Problem: Urinary Elimination: Goal: Ability to achieve and maintain adequate renal perfusion and functioning will improve Outcome: Progressing   

## 2018-03-11 NOTE — Progress Notes (Addendum)
   He is alert and oriented.  Slept well last night.  His exam is unremarkable.  No rub is heard.  03/10/2018 EKG reveals persistent atrial fibrillation and ST elevation in the precordial leads.  Months demonstrates normal sinus rhythm.  Can convert IV amiodarone to oral therapy.  200 mg twice daily.  If recurrent atrial fibrillation went bolus with IV amnio.

## 2018-03-11 NOTE — Progress Notes (Signed)
Patient ID: Zachary Ponce, male   DOB: [birth year], 67 y.o.   MRN: [id number] [facility] Evening Rounds  Hemodynamically stable in sinus rhythm on oral amiodarone.  Diuresed well today  sats 100%

## 2018-03-11 NOTE — Progress Notes (Signed)
3 Days Post-Op Procedure(s) (LRB): CORONARY ARTERY BYPASS GRAFTING (CABG) X4, RIGHT SAPHENOUS VEIN HARVEST. LIMA TO LAD, SVG TO OM, SVG TO RAMUS, SVG TO PD. (N/A) TRANSESOPHAGEAL ECHOCARDIOGRAM (TEE) (N/A) ENDARTERECTOMY CAROTID (Left) Subjective: No specific complaints. Waiting on breakfast. Slept better.  Objective: Vital signs in last 24 hours: Temp:  [97.5 F (36.4 C)-98 F (36.7 C)] 98 F (36.7 C) (07/06 0749) Pulse Rate:  [58-101] 62 (07/06 0700) Cardiac Rhythm: Ventricular paced (07/06 0200) Resp:  [11-20] 17 (07/06 0700) BP: (103-146)/(50-80) 106/52 (07/06 0600) SpO2:  [88 %-100 %] 100 % (07/06 0700) Weight:  [84.7 kg (186 lb 11.7 oz)] 84.7 kg (186 lb 11.7 oz) (07/06 0700)  Hemodynamic parameters for last 24 hours:    Intake/Output from previous day: 07/05 0701 - 07/06 0700 In: 1963.9 [P.O.:1080; I.V.:795.2; IV Piggyback:88.6] Out: 1225 [Urine:1225] Intake/Output this shift: No intake/output data recorded.  General appearance: alert and cooperative Neurologic: intact Heart: regular rate and rhythm, S1, S2 normal, no murmur, click, rub or gallop Lungs: diminished breath sounds bibasilar Extremities: edema moderate Wound: incisions ok  Lab Results: Recent Labs    03/10/18 0344  03/10/18 1600 03/11/18 0400  WBC 10.6*  --   --  9.8  HGB 8.9*   < > 9.5* 8.4*  HCT 28.9*   < > 28.0* 27.0*  PLT 110*  --   --  116*   < > = values in this interval not displayed.   BMET:  Recent Labs    03/10/18 0344  03/10/18 1600 03/11/18 0400  NA 135   < > 134* 136  K 4.2   < > 4.0 3.9  CL 103   < > 97* 101  CO2 27  --   --  28  GLUCOSE 128*   < > 146* 115*  BUN 16   < > 17 12  CREATININE 0.81   < > 0.60* 0.69  CALCIUM 8.1*  --   --  8.2*   < > = values in this interval not displayed.    PT/INR:  Recent Labs    03/08/18 1631  LABPROT 16.9*  INR 1.38   ABG    Component Value Date/Time   PHART 7.307 (L) 03/08/2018 2228   HCO3 22.6 03/08/2018 2228   TCO2 24  03/10/2018 1600   ACIDBASEDEF 4.0 (H) 03/08/2018 2228   O2SAT 99.0 03/08/2018 2228   CBG (last 3)  Recent Labs    03/09/18 0742 03/09/18 1210 03/09/18 1557  GLUCAP 114* 125* 133*   CXR: bibasilar atelectasis and small effusions.  Assessment/Plan: S/P Procedure(s) (LRB): CORONARY ARTERY BYPASS GRAFTING (CABG) X4, RIGHT SAPHENOUS VEIN HARVEST. LIMA TO LAD, SVG TO OM, SVG TO RAMUS, SVG TO PD. (N/A) TRANSESOPHAGEAL ECHOCARDIOGRAM (TEE) (N/A) ENDARTERECTOMY CAROTID (Left)  POD 3 Hemodynamically stable in sinus rhythm on IV amio. He is on midodrine 5 tid to support BP and HR low 60's so will hold off on beta blocker.  Postop atrial fib converted to sinus on amio. Switch to po 200 bid today.  Volume excess: 16 lbs over preop due to atrial fib and marginal BP postop preventing diuresis. Will start diuresis today since BP better. Replace K+.  DC sleeve and foley  IS, ambulation  Will keep in ICU today to convert to oral amio and start diuresis, mobilize since BP has been marginal.   LOS: 8 days    Alleen BorneBryan K Samauri Kellenberger 03/11/2018

## 2018-03-12 DIAGNOSIS — I6523 Occlusion and stenosis of bilateral carotid arteries: Secondary | ICD-10-CM

## 2018-03-12 LAB — BASIC METABOLIC PANEL
Anion gap: 8 (ref 5–15)
BUN: 13 mg/dL (ref 8–23)
CO2: 31 mmol/L (ref 22–32)
Calcium: 8.8 mg/dL — ABNORMAL LOW (ref 8.9–10.3)
Chloride: 95 mmol/L — ABNORMAL LOW (ref 98–111)
Creatinine, Ser: 0.79 mg/dL (ref 0.61–1.24)
GFR calc Af Amer: >60 mL/min (ref >60)
GFR calc non Af Amer: >60 mL/min (ref >60)
Glucose, Bld: 110 mg/dL — ABNORMAL HIGH (ref 70–99)
Potassium: 3.8 mmol/L (ref 3.5–5.1)
Sodium: 134 mmol/L — ABNORMAL LOW (ref 135–145)

## 2018-03-12 LAB — CBC
HCT: 29.4 % — ABNORMAL LOW (ref 39.0–52.0)
Hemoglobin: 9.3 g/dL — ABNORMAL LOW (ref 13.0–17.0)
MCH: 30.9 pg (ref 26.0–34.0)
MCHC: 31.6 g/dL (ref 30.0–36.0)
MCV: 97.7 fL (ref 78.0–100.0)
Platelets: 174 10*3/uL (ref 150–400)
RBC: 3.01 MIL/uL — ABNORMAL LOW (ref 4.22–5.81)
RDW: 13.5 % (ref 11.5–15.5)
WBC: 9.9 10*3/uL (ref 4.0–10.5)

## 2018-03-12 MED ORDER — SODIUM CHLORIDE 0.9 % IV SOLN: 250.0000 mL | INTRAVENOUS | Status: DC | PRN

## 2018-03-12 MED ORDER — SODIUM CHLORIDE 0.9% FLUSH
3.0000 mL | Freq: Two times a day (BID) | INTRAVENOUS | Status: DC
  Administered 2018-03-12 – 2018-03-15 (×8): 3 mL via INTRAVENOUS

## 2018-03-12 MED ORDER — TRAMADOL HCL 50 MG PO TABS: 50.0000 mg | ORAL_TABLET | ORAL | Status: DC | PRN

## 2018-03-12 MED ORDER — POTASSIUM CHLORIDE CRYS ER 20 MEQ PO TBCR
40.0000 meq | EXTENDED_RELEASE_TABLET | Freq: Two times a day (BID) | ORAL | Status: AC
  Administered 2018-03-12 (×2): 40 meq via ORAL
  Filled 2018-03-12 (×2): qty 2

## 2018-03-12 MED ORDER — ONDANSETRON HCL 4 MG PO TABS: 4.0000 mg | ORAL_TABLET | Freq: Four times a day (QID) | ORAL | Status: DC | PRN

## 2018-03-12 MED ORDER — SODIUM CHLORIDE 0.9% FLUSH: 3.0000 mL | INTRAVENOUS | Status: DC | PRN

## 2018-03-12 MED ORDER — POTASSIUM CHLORIDE CRYS ER 10 MEQ PO TBCR
20.0000 meq | EXTENDED_RELEASE_TABLET | Freq: Two times a day (BID) | ORAL | Status: DC
  Administered 2018-03-13 – 2018-03-16 (×7): 20 meq via ORAL
  Filled 2018-03-12 (×4): qty 2
  Filled 2018-03-12: qty 1
  Filled 2018-03-12: qty 2
  Filled 2018-03-12: qty 1

## 2018-03-12 MED ORDER — DOCUSATE SODIUM 100 MG PO CAPS
200.0000 mg | ORAL_CAPSULE | Freq: Every day | ORAL | Status: DC
  Administered 2018-03-12 – 2018-03-16 (×5): 200 mg via ORAL
  Filled 2018-03-12 (×5): qty 2

## 2018-03-12 MED ORDER — OXYCODONE HCL 5 MG PO TABS: 5.0000 mg | ORAL_TABLET | ORAL | Status: DC | PRN

## 2018-03-12 MED ORDER — BISACODYL 10 MG RE SUPP: 10.0000 mg | Freq: Every day | RECTAL | Status: DC | PRN

## 2018-03-12 MED ORDER — ASPIRIN EC 325 MG PO TBEC
325.0000 mg | DELAYED_RELEASE_TABLET | Freq: Every day | ORAL | Status: DC
  Administered 2018-03-12 – 2018-03-13 (×2): 325 mg via ORAL
  Filled 2018-03-12 (×2): qty 1

## 2018-03-12 MED ORDER — PANTOPRAZOLE SODIUM 40 MG PO TBEC
40.0000 mg | DELAYED_RELEASE_TABLET | Freq: Every day | ORAL | Status: DC
  Administered 2018-03-12 – 2018-03-16 (×5): 40 mg via ORAL
  Filled 2018-03-12 (×5): qty 1

## 2018-03-12 MED ORDER — ONDANSETRON HCL 4 MG/2ML IJ SOLN: 4.0000 mg | Freq: Four times a day (QID) | INTRAMUSCULAR | Status: DC | PRN

## 2018-03-12 MED ORDER — BISACODYL 5 MG PO TBEC: 10.0000 mg | DELAYED_RELEASE_TABLET | Freq: Every day | ORAL | Status: DC | PRN

## 2018-03-12 MED ORDER — METOLAZONE 2.5 MG PO TABS
2.5000 mg | ORAL_TABLET | Freq: Once | ORAL | Status: AC
  Administered 2018-03-12: 2.5 mg via ORAL
  Filled 2018-03-12: qty 1

## 2018-03-12 MED ORDER — ACETAMINOPHEN 325 MG PO TABS: 650.0000 mg | ORAL_TABLET | Freq: Four times a day (QID) | ORAL | Status: DC | PRN

## 2018-03-12 MED ORDER — MOVING RIGHT ALONG BOOK
Freq: Once | Status: AC
  Administered 2018-03-12: 1
  Filled 2018-03-12: qty 1

## 2018-03-12 NOTE — Progress Notes (Addendum)
Patient moved to 4E06. Patient ambulated from his room in 2H to new room on 4E using rolling walker, tolerated well.  All belongings including cell phone, eye glasses, eye glass case, charger, soft drinks and SCDs moved with patient. Wife at bedside and aware of transfer.   Receiving RN at bedside.

## 2018-03-12 NOTE — Plan of Care (Signed)
  Problem: Health Behavior/Discharge Planning: Goal: Ability to manage health-related needs will improve Outcome: Progressing   Problem: Clinical Measurements: Goal: Ability to maintain clinical measurements within normal limits will improve Outcome: Progressing Goal: Will remain free from infection Outcome: Progressing Goal: Diagnostic test results will improve Outcome: Progressing Goal: Respiratory complications will improve Outcome: Progressing Goal: Cardiovascular complication will be avoided Outcome: Progressing   Problem: Coping: Goal: Level of anxiety will decrease Outcome: Progressing   Problem: Safety: Goal: Ability to remain free from injury will improve Outcome: Progressing   Problem: Skin Integrity: Goal: Risk for impaired skin integrity will decrease Outcome: Progressing   Problem: Education: Goal: Understanding of cardiac disease, CV risk reduction, and recovery process will improve Outcome: Progressing   Problem: Activity: Goal: Ability to tolerate increased activity will improve Outcome: Progressing   Problem: Cardiac: Goal: Ability to achieve and maintain adequate cardiovascular perfusion will improve Outcome: Progressing   Problem: Health Behavior/Discharge Planning: Goal: Ability to safely manage health-related needs after discharge will improve Outcome: Progressing   Problem: Education: Goal: Ability to demonstrate proper wound care will improve Outcome: Progressing Goal: Knowledge of disease or condition will improve Outcome: Progressing Goal: Knowledge of the prescribed therapeutic regimen will improve Outcome: Progressing   Problem: Activity: Goal: Risk for activity intolerance will decrease Outcome: Progressing   Problem: Cardiac: Goal: Hemodynamic stability will improve Outcome: Progressing   Problem: Clinical Measurements: Goal: Postoperative complications will be avoided or minimized Outcome: Progressing   Problem:  Respiratory: Goal: Respiratory status will improve Outcome: Progressing   Problem: Urinary Elimination: Goal: Ability to achieve and maintain adequate renal perfusion and functioning will improve Outcome: Progressing

## 2018-03-12 NOTE — Progress Notes (Addendum)
4 Days Post-Op Procedure(s) (LRB): CORONARY ARTERY BYPASS GRAFTING (CABG) X4, RIGHT SAPHENOUS VEIN HARVEST. LIMA TO LAD, SVG TO OM, SVG TO RAMUS, SVG TO PD. (N/A) TRANSESOPHAGEAL ECHOCARDIOGRAM (TEE) (N/A) ENDARTERECTOMY CAROTID (Left) Subjective: No complaints, feeling better, walking well, ate all of breakfast  Objective: Vital signs in last 24 hours: Temp:  [97.5 F (36.4 C)-98 F (36.7 C)] 97.9 F (36.6 C) (07/07 0803) Pulse Rate:  [52-145] 52 (07/07 0800) Cardiac Rhythm: Normal sinus rhythm (07/06 2000) Resp:  [14-21] 18 (07/07 0800) BP: (92-162)/(52-105) 104/68 (07/07 0800) SpO2:  [90 %-100 %] 90 % (07/07 0800) Weight:  [84.2 kg (185 lb 10 oz)] 84.2 kg (185 lb 10 oz) (07/07 0500)  Hemodynamic parameters for last 24 hours:    Intake/Output from previous day: 07/06 0701 - 07/07 0700 In: 207.9 [P.O.:120; I.V.:87.9] Out: 3175 [Urine:3175] Intake/Output this shift: Total I/O In: 120 [P.O.:120] Out: 400 [Urine:400]  General appearance: alert and cooperative Neurologic: intact Heart: regular rate and rhythm, S1, S2 normal, no murmur, click, rub or gallop Lungs: diminished breath sounds bibasilar Extremities: edema mild Wound: incisions ok  Lab Results: Recent Labs    03/11/18 0400 03/12/18 0349  WBC 9.8 9.9  HGB 8.4* 9.3*  HCT 27.0* 29.4*  PLT 116* 174   BMET:  Recent Labs    03/11/18 0400 03/12/18 0349  NA 136 134*  K 3.9 3.8  CL 101 95*  CO2 28 31  GLUCOSE 115* 110*  BUN 12 13  CREATININE 0.69 0.79  CALCIUM 8.2* 8.8*    PT/INR: No results for input(s): LABPROT, INR in the last 72 hours. ABG    Component Value Date/Time   PHART 7.307 (L) 03/08/2018 2228   HCO3 22.6 03/08/2018 2228   TCO2 24 03/10/2018 1600   ACIDBASEDEF 4.0 (H) 03/08/2018 2228   O2SAT 99.0 03/08/2018 2228   CBG (last 3)  Recent Labs    03/09/18 1210 03/09/18 1557  GLUCAP 125* 133*    Assessment/Plan: S/P Procedure(s) (LRB): CORONARY ARTERY BYPASS GRAFTING (CABG) X4,  RIGHT SAPHENOUS VEIN HARVEST. LIMA TO LAD, SVG TO OM, SVG TO RAMUS, SVG TO PD. (N/A) TRANSESOPHAGEAL ECHOCARDIOGRAM (TEE) (N/A) ENDARTERECTOMY CAROTID (Left)  POD 4 Hemodynamically stable in sinus rhythm on oral amio. He is still on midodrine 5 tid to support BP postop.   Volume excess: diuresed - 3L yesterday but weight only down 1 lb and still 15 lbs over preop if accurate. He does not look 15 lbs up. Will continue diuretic for now.  Transfer to 4E and continue mobilization.   LOS: 9 days    Alleen BorneBryan K Lyndell Allaire 03/12/2018

## 2018-03-12 NOTE — Progress Notes (Signed)
   Awake, alert.  Quiet night without hemodynamic or rhythm issues.  Maintaining normal sinus rhythm.  Transferring to floor.  Continue amiodarone.

## 2018-03-13 ENCOUNTER — Inpatient Hospital Stay (HOSPITAL_COMMUNITY): Payer: Medicare Other

## 2018-03-13 DIAGNOSIS — R9439 Abnormal result of other cardiovascular function study: Secondary | ICD-10-CM

## 2018-03-13 LAB — CBC
HCT: 30.4 % — ABNORMAL LOW (ref 39.0–52.0)
Hemoglobin: 9.9 g/dL — ABNORMAL LOW (ref 13.0–17.0)
MCH: 31.2 pg (ref 26.0–34.0)
MCHC: 32.6 g/dL (ref 30.0–36.0)
MCV: 95.9 fL (ref 78.0–100.0)
Platelets: 240 10*3/uL (ref 150–400)
RBC: 3.17 MIL/uL — ABNORMAL LOW (ref 4.22–5.81)
RDW: 13.6 % (ref 11.5–15.5)
WBC: 9.2 10*3/uL (ref 4.0–10.5)

## 2018-03-13 LAB — BASIC METABOLIC PANEL
Anion gap: 9 (ref 5–15)
BUN: 17 mg/dL (ref 8–23)
CO2: 33 mmol/L — ABNORMAL HIGH (ref 22–32)
Calcium: 9 mg/dL (ref 8.9–10.3)
Chloride: 92 mmol/L — ABNORMAL LOW (ref 98–111)
Creatinine, Ser: 0.94 mg/dL (ref 0.61–1.24)
GFR calc Af Amer: >60 mL/min (ref >60)
GFR calc non Af Amer: >60 mL/min (ref >60)
Glucose, Bld: 113 mg/dL — ABNORMAL HIGH (ref 70–99)
Potassium: 3.8 mmol/L (ref 3.5–5.1)
Sodium: 134 mmol/L — ABNORMAL LOW (ref 135–145)

## 2018-03-13 MED ORDER — MIDODRINE HCL 5 MG PO TABS
5.0000 mg | ORAL_TABLET | Freq: Two times a day (BID) | ORAL | Status: DC
  Administered 2018-03-13 – 2018-03-16 (×6): 5 mg via ORAL
  Filled 2018-03-13 (×6): qty 1

## 2018-03-13 MED ORDER — METOPROLOL TARTRATE 5 MG/5ML IV SOLN
INTRAVENOUS | Status: AC
  Filled 2018-03-13: qty 5

## 2018-03-13 MED ORDER — LACTULOSE 10 GM/15ML PO SOLN
20.0000 g | Freq: Every day | ORAL | Status: DC | PRN
  Administered 2018-03-13: 20 g via ORAL
  Filled 2018-03-13: qty 30

## 2018-03-13 MED ORDER — AMIODARONE HCL 200 MG PO TABS
200.0000 mg | ORAL_TABLET | Freq: Once | ORAL | Status: AC
  Administered 2018-03-13: 200 mg via ORAL
  Filled 2018-03-13: qty 1

## 2018-03-13 MED ORDER — AMIODARONE HCL 200 MG PO TABS
400.0000 mg | ORAL_TABLET | Freq: Two times a day (BID) | ORAL | Status: DC
  Administered 2018-03-13 – 2018-03-14 (×3): 400 mg via ORAL
  Filled 2018-03-13 (×3): qty 2

## 2018-03-13 MED ORDER — METOPROLOL TARTRATE 12.5 MG HALF TABLET
12.5000 mg | ORAL_TABLET | Freq: Two times a day (BID) | ORAL | Status: DC
  Administered 2018-03-14 – 2018-03-16 (×5): 12.5 mg via ORAL
  Filled 2018-03-13 (×5): qty 1

## 2018-03-13 MED ORDER — METOPROLOL TARTRATE 5 MG/5ML IV SOLN
2.5000 mg | Freq: Once | INTRAVENOUS | Status: DC
  Filled 2018-03-13: qty 5

## 2018-03-13 MED ORDER — POTASSIUM CHLORIDE CRYS ER 20 MEQ PO TBCR
40.0000 meq | EXTENDED_RELEASE_TABLET | Freq: Once | ORAL | Status: AC
  Administered 2018-03-13: 40 meq via ORAL
  Filled 2018-03-13: qty 2

## 2018-03-13 NOTE — Care Management Note (Signed)
Case Management Note Donn PieriniKristi Darsi Tien RN, BSN Unit 4E-Case Manager [phone]  Patient Details  Name: Zachary LamerDouglas S Ponce MRN: [id number] Date of Birth: 06-Jun-1951  Subjective/Objective:    Pt admitted with c/p - s/p CABG on 03/08/18                Action/Plan: PTA pt lived at home with wife- orders have been placed for HHRN/PT- spoke with pt at bedside- choice offered for agency- pt request list be left to review with wife- CM will f/u for choice prior to discharge- per pt he has RW and 3n1 at home- family to confirm- CM will follow for transition of care needs  Expected Discharge Date:                  Expected Discharge Plan:  Home w Home Health Services  In-House Referral:  NA  Discharge planning Services  CM Consult  Post Acute Care Choice:  Home Health Choice offered to:     DME Arranged:    DME Agency:     HH Arranged:  RN, PT HH Agency:     Status of Service:  In process, will continue to follow  If discussed at Long Length of Stay Meetings, dates discussed:    Additional Comments:  Darrold SpanWebster, Monika Chestang Hall, RN 03/13/2018, 4:23 PM

## 2018-03-13 NOTE — Progress Notes (Addendum)
Patient to walk with cardiac rehab, when sitting on side of bed heart rate went up and back in to atrial fib on monitor. Rate in the 1-teens currently, per cardiac rehab patient stated "they don't feel" well and wanted to lay back down. See Cardiac rehab note. Will monitor patient. Hoang Reich, Randall AnKristin Jessup RN

## 2018-03-13 NOTE — Progress Notes (Addendum)
Patient converted to NSR on monitor at this time rate 87. bp 108/66 patient resting comfortably in bed. Metoprolol not give at this time as heart rate NSR 87-91. Will continue to monitor patient. Tameia Rafferty, Randall AnKristin Jessup RN

## 2018-03-13 NOTE — Progress Notes (Addendum)
Patient coughing this AM and now in Atrial fib on monitor rate 80-100s. BP 93/67 . At times also with some sinus beats Patient states it "feels weird" on the right side of his chest. While in atrial fib . Patient previously with NSR with PACs on monitor. When EKG obtained patient back in NSR Will continue to monitor patient. Jazzlin Clements, Randall AnKristin Jessup RN

## 2018-03-13 NOTE — Progress Notes (Signed)
CARDIAC REHAB PHASE I   PRE:  Rate/Rhythm: 89 SR  PACs  BP:  Supine: 83/63  Sitting:   Standing:    SaO2: 95%  MODE:  Ambulation: 0 ft   POST:  Rate/Rhythm: 140's afib  BP:  Supine: 116/68  Sitting:   Standing:   1340-1357 Came to walk pt as he was in NSR. Sat pt up to get a BP as he was low lying in bed and wanted to see what it would be sitting. Pt immediately went into afib rate 140's and asked to lie back down as he felt nervous or shakey. BP lying at 116/68.  Rate to 120's. Notified RN.  Pt wants to walk but rhythm not allowing.   Luetta Nuttingharlene Ebon Ketchum, RN BSN  03/13/2018 1:52 PM

## 2018-03-13 NOTE — Progress Notes (Addendum)
      [address]       [place] [id number]             [phone]      5 Days Post-Op Procedure(s) (LRB): CORONARY ARTERY BYPASS GRAFTING (CABG) X4, RIGHT SAPHENOUS VEIN HARVEST. LIMA TO LAD, SVG TO OM, SVG TO RAMUS, SVG TO PD. (N/A) TRANSESOPHAGEAL ECHOCARDIOGRAM (TEE) (N/A) ENDARTERECTOMY CAROTID (Left)   Subjective:  No new complaints.  States he had a good day yesterday.  He is requesting home health at discharge, as his wife is due to have surgery in the immediate future.  Objective: Vital signs in last 24 hours: Temp:  [97.6 F (36.4 C)-98.1 F (36.7 C)] 98.1 F (36.7 C) (07/08 0447) Pulse Rate:  [52-81] 76 (07/08 0447) Cardiac Rhythm: Normal sinus rhythm (07/08 0706) Resp:  [17-22] 21 (07/08 0447) BP: (104-143)/(60-70) 109/60 (07/08 0447) SpO2:  [90 %-100 %] 96 % (07/08 0447) Weight:  [174 lb 6.1 oz (79.1 kg)-180 lb 8.9 oz (81.9 kg)] 174 lb 6.1 oz (79.1 kg) (07/08 0447)  Intake/Output from previous day: 07/07 0701 - 07/08 0700 In: 360 [P.O.:360] Out: 925 [Urine:925]  General appearance: alert, cooperative and no distress Heart: regular rate and rhythm Lungs: clear to auscultation bilaterally Abdomen: soft, non-tender; bowel sounds normal; no masses,  no organomegaly Extremities: edema trace Wound: clean and dry  Lab Results: Recent Labs    03/12/18 0349 03/13/18 0323  WBC 9.9 9.2  HGB 9.3* 9.9*  HCT 29.4* 30.4*  PLT 174 240   BMET:  Recent Labs    03/12/18 0349 03/13/18 0323  NA 134* 134*  K 3.8 3.8  CL 95* 92*  CO2 31 33*  GLUCOSE 110* 113*  BUN 13 17  CREATININE 0.79 0.94  CALCIUM 8.8* 9.0    PT/INR: No results for input(s): LABPROT, INR in the last 72 hours. ABG    Component Value Date/Time   PHART 7.307 (L) 03/08/2018 2228   HCO3 22.6 03/08/2018 2228   TCO2 24 03/10/2018 1600   ACIDBASEDEF 4.0 (H) 03/08/2018 2228   O2SAT 99.0 03/08/2018 2228   CBG (last 3)  No results for input(s): GLUCAP in the last 72  hours.  Assessment/Plan: S/P Procedure(s) (LRB): CORONARY ARTERY BYPASS GRAFTING (CABG) X4, RIGHT SAPHENOUS VEIN HARVEST. LIMA TO LAD, SVG TO OM, SVG TO RAMUS, SVG TO PD. (N/A) TRANSESOPHAGEAL ECHOCARDIOGRAM (TEE) (N/A) ENDARTERECTOMY CAROTID (Left)  1. CV- NSR, missed beat- on Amiodarone, Orthostatic hypotension- on Midodrine 2. Pulm- no acute issues, continue IS, CXR with improvement of pleural effusions 3. Renal- creatinine WNL, weight is trending down, continue Lasix 4. Expected post operative blood loss anemia,  Mild at 9.9 5. Dispo- patient stable, will d/c EPW, continue diuretics, possibly ready for d/c in next 24-48 hours   LOS: 10 days    Lowella Dandyrin Barrett 03/13/2018  Patient examined, agree with above assessment and plan by EB, PA.  Continue oral amiodarone.  We will add low-dose oral beta-blocker tomorrow and reduce midodrine to twice daily. Plan to remove temporary pacing wires tomorrow start patient on Coumadin.  Check INR in a.m.  patient examined and medical record reviewed,agree with above note. Kathlee Nationseter Van Trigt III 03/13/2018

## 2018-03-13 NOTE — Progress Notes (Signed)
  Progress Note    03/13/2018 7:39 AM 5 Days Post-Op  Subjective:  No complaints; felt like yesterday was a turn around day  Afebrile HR 70's-80's NSR 100's-110's systolic 96% RA Vitals:   03/12/18 2023 03/13/18 0447  BP: 115/63 109/60  Pulse: 81 76  Resp: 19 (!) 21  Temp: 98.1 F (36.7 C) 98.1 F (36.7 C)  SpO2: 97% 96%     Physical Exam: Neuro:  In tact; tongue is midline Lungs:  Non labored Incision:  Left neck incision with mild fullness but clean and dry.  CBC    Component Value Date/Time   WBC 9.2 03/13/2018 0323   RBC 3.17 (L) 03/13/2018 0323   HGB 9.9 (L) 03/13/2018 0323   HCT 30.4 (L) 03/13/2018 0323   PLT 240 03/13/2018 0323   MCV 95.9 03/13/2018 0323   MCH 31.2 03/13/2018 0323   MCHC 32.6 03/13/2018 0323   RDW 13.6 03/13/2018 0323   LYMPHSABS 2.9 02/18/2011 1314   MONOABS 0.9 02/18/2011 1314   EOSABS 0.3 02/18/2011 1314   BASOSABS 0.0 02/18/2011 1314    BMET    Component Value Date/Time   NA 134 (L) 03/13/2018 0323   K 3.8 03/13/2018 0323   CL 92 (L) 03/13/2018 0323   CO2 33 (H) 03/13/2018 0323   GLUCOSE 113 (H) 03/13/2018 0323   BUN 17 03/13/2018 0323   CREATININE 0.94 03/13/2018 0323   CALCIUM 9.0 03/13/2018 0323   GFRNONAA >60 03/13/2018 0323   GFRAA >60 03/13/2018 0323     Intake/Output Summary (Last 24 hours) at 03/13/2018 0739 Last data filed at 03/12/2018 2024 Gross per 24 hour  Intake 360 ml  Output 925 ml  Net -565 ml     Assessment/Plan:  This is a 67 y.o. male who is s/p combination CABG/left CEA 5 Days Post-Op  -pt is doing well this am and neuro is in tact.  -pt neuro exam is in tact -f/u with Dr. Darrick PennaFields in 2 weeks to discuss right    Doreatha MassedSamantha Rosielee Corporan, PA-C [facility] [phone]

## 2018-03-13 NOTE — Progress Notes (Signed)
Patient up to chair x1 this afternoon, BP  Down to 84/56 when sitting in chair. Upon recheck after a few minutes BP 98/71. Heart rate spiked up to 142 atrial fib on monitor. Currently 138-141. Patient states "he feels pretty good in chair" will continue to monitor patient. Munirah Doerner, Randall AnKristin Jessup RN

## 2018-03-13 NOTE — Progress Notes (Addendum)
Patient heart rate increasing sustaining this afternoon 120s- up to 152 Atrial fib on monitor.  Patient bp 117/71 Lowella Dandyrin Barrett [facility] made aware and orders received. Will continue to monitor patient. Auna Mikkelsen, Randall AnKristin Jessup RN

## 2018-03-13 NOTE — Progress Notes (Signed)
CARDIAC REHAB PHASE I   PRE:  Rate/Rhythm: 80 SR PACs     BP:  Supine: 93/60  Sitting:   Standing:    SaO2: 95%  MODE:  Ambulation: 0 ft   POST:  Rate/Rhythm: 139 afib after coughing   1020-1030    Came to see pt earlier at 0836. Pt was receiving his meds and he had difficulty with potassium and started coughing and went in to afib. He was having many PACs prior to this. Told pt will return to walk. Now at 1020 pt is in afib and got more amio. Will hold walk and return after lunch.. Notified pt that I will return to walk.   [name], RN BSN  03/13/2018 10:27 AM

## 2018-03-13 NOTE — Progress Notes (Signed)
Reported to this RN from nursing staff that patient "wasn't feeling well in chair" and wanted to lie down. Nursing staff took patients blood pressure 111/67 heart rate 90 on monitor . Upon checking on patient pt states he feels better now after lying in bed for a little bit. Encouraged patient to move slowly when moving positions in bed (lying to sitting). Will continue to monitor patient. Zachary Ponce, Randall AnKristin Jessup RN

## 2018-03-13 NOTE — Progress Notes (Addendum)
Patient ambulated short distance in hallway with nursing staff and walker.patietn heart rate in 90s.  Patient complaints of nausea and patient wanted to go back to room. Patient back in room in bed Will monitor patient Zachary Ponce, Randall AnKristin Jessup RN

## 2018-03-13 NOTE — Progress Notes (Addendum)
Patient back in atrial fib. Rate up to 127. Erin Barrett PAC and Dr. Donata ClayVan Trigt made aware and orders received will monitor patient.  Will Keep wires in today  Zachary Ponce, Randall AnKristin Jessup RN

## 2018-03-14 ENCOUNTER — Telehealth: Payer: Self-pay | Admitting: Vascular Surgery

## 2018-03-14 LAB — BASIC METABOLIC PANEL
Anion gap: 12 (ref 5–15)
BUN: 24 mg/dL — ABNORMAL HIGH (ref 8–23)
CO2: 29 mmol/L (ref 22–32)
Calcium: 9 mg/dL (ref 8.9–10.3)
Chloride: 93 mmol/L — ABNORMAL LOW (ref 98–111)
Creatinine, Ser: 1.05 mg/dL (ref 0.61–1.24)
GFR calc Af Amer: >60 mL/min (ref >60)
GFR calc non Af Amer: >60 mL/min (ref >60)
Glucose, Bld: 120 mg/dL — ABNORMAL HIGH (ref 70–99)
Potassium: 4.1 mmol/L (ref 3.5–5.1)
Sodium: 134 mmol/L — ABNORMAL LOW (ref 135–145)

## 2018-03-14 LAB — CBC
HCT: 31.7 % — ABNORMAL LOW (ref 39.0–52.0)
Hemoglobin: 10.3 g/dL — ABNORMAL LOW (ref 13.0–17.0)
MCH: 31.7 pg (ref 26.0–34.0)
MCHC: 32.5 g/dL (ref 30.0–36.0)
MCV: 97.5 fL (ref 78.0–100.0)
Platelets: 281 10*3/uL (ref 150–400)
RBC: 3.25 MIL/uL — ABNORMAL LOW (ref 4.22–5.81)
RDW: 13.9 % (ref 11.5–15.5)
WBC: 10.3 10*3/uL (ref 4.0–10.5)

## 2018-03-14 LAB — PROTIME-INR
INR: 1.02
Prothrombin Time: 13.3 seconds (ref 11.4–15.2)

## 2018-03-14 MED ORDER — ASPIRIN EC 325 MG PO TBEC
325.0000 mg | DELAYED_RELEASE_TABLET | Freq: Every day | ORAL | Status: DC
  Administered 2018-03-14: 325 mg via ORAL
  Filled 2018-03-14: qty 1

## 2018-03-14 MED ORDER — WARFARIN SODIUM 2.5 MG PO TABS: 2.5000 mg | ORAL_TABLET | Freq: Once | ORAL | Status: DC

## 2018-03-14 MED ORDER — APIXABAN 5 MG PO TABS
5.0000 mg | ORAL_TABLET | Freq: Two times a day (BID) | ORAL | Status: DC
  Administered 2018-03-15 – 2018-03-16 (×3): 5 mg via ORAL
  Filled 2018-03-14 (×3): qty 1

## 2018-03-14 MED ORDER — WARFARIN - PHYSICIAN DOSING INPATIENT: Freq: Every day | Status: DC

## 2018-03-14 MED ORDER — ASPIRIN EC 81 MG PO TBEC: 81.0000 mg | DELAYED_RELEASE_TABLET | Freq: Every day | ORAL | Status: DC

## 2018-03-14 NOTE — Progress Notes (Addendum)
Pulled patient pacer wires per provider orders. Patient VS are stable. Wires came out with no complication, no tissue attached. Area is clean dry and intact. Patient on bedrest until 1350. Will continue to monitor patient.

## 2018-03-14 NOTE — Progress Notes (Signed)
CARDIAC REHAB PHASE I   PRE:  Rate/Rhythm: 90 SR  BP:  Sitting: 127/62      SaO2: 100 RA  MODE:  Ambulation: 320 ft 103 peak HR  POST:  Rate/Rhythm: 95 SR  BP:  Sitting: 140/66    SaO2: 97 RA   Pt ambulated 35820ft in hallway assist of one with front wheel rolling walker. Reviewed sternal precautions with pt and wife. Pt demonstrated 1500 on IS, encouraged continued use. Pt and wife given heart healthy diet. Will continue to follow and will reinforce and slowly add education as pt tolerates.  [phone] Zachary Boweneresa  Moody Robben, RN BSN 03/14/2018 11:15 AM

## 2018-03-14 NOTE — Telephone Encounter (Signed)
sch appt lvm 04/05/18 4pm carotid 04/06/18 345pm f/u MD

## 2018-03-14 NOTE — Progress Notes (Signed)
Patient ambulated about 200 feet using front wheel walker,on room air tolerated well.HR maintained at 70 to 8os per minute.

## 2018-03-14 NOTE — Progress Notes (Addendum)
      [address]       [place] [id number]             [phone]        6 Days Post-Op Procedure(s) (LRB): CORONARY ARTERY BYPASS GRAFTING (CABG) X4, RIGHT SAPHENOUS VEIN HARVEST. LIMA TO LAD, SVG TO OM, SVG TO RAMUS, SVG TO PD. (N/A) TRANSESOPHAGEAL ECHOCARDIOGRAM (TEE) (N/A) ENDARTERECTOMY CAROTID (Left)  Subjective: Patient had bowel movement this am. He feels he is slowly getting better.  Objective: Vital signs in last 24 hours: Temp:  [97.9 F (36.6 C)-98.4 F (36.9 C)] 97.9 F (36.6 C) (07/09 0400) Pulse Rate:  [47-96] 75 (07/09 0400) Cardiac Rhythm: Atrial fibrillation (07/08 1940) Resp:  [16-25] 16 (07/09 0400) BP: (84-137)/(52-73) 106/66 (07/09 0400) SpO2:  [92 %-95 %] 93 % (07/09 0400) Weight:  [170 lb 13.7 oz (77.5 kg)] 170 lb 13.7 oz (77.5 kg) (07/09 0400)  Pre op weight 77.6 kg Current Weight  03/14/18 170 lb 13.7 oz (77.5 kg)      Intake/Output from previous day: 07/08 0701 - 07/09 0700 In: 240 [P.O.:240] Out: 550 [Urine:550]   Physical Exam:  Cardiovascular: RRR Pulmonary: Clear to auscultation bilaterally Abdomen: Soft, non tender, bowel sounds present. Extremities: Trace bilateral lower extremity edema. Ecchymosis right thigh. Wounds: Clean and dry.  No erythema or signs of infection.  Lab Results: CBC: Recent Labs    03/13/18 0323 03/14/18 0450  WBC 9.2 10.3  HGB 9.9* 10.3*  HCT 30.4* 31.7*  PLT 240 281   BMET:  Recent Labs    03/13/18 0323 03/14/18 0450  NA 134* 134*  K 3.8 4.1  CL 92* 93*  CO2 33* 29  GLUCOSE 113* 120*  BUN 17 24*  CREATININE 0.94 1.05  CALCIUM 9.0 9.0    PT/INR:  Lab Results  Component Value Date   INR 1.02 03/14/2018   INR 1.38 03/08/2018   INR 1.07 03/04/2018   ABG:  INR: Will add last result for INR, ABG once components are confirmed Will add last 4 CBG results once components are confirmed  Assessment/Plan:  1. CV - Previous a fib. First degree heart block, SR this  am. On Amiodarone 400 mg bid, Lopressor 12.5 mg bid, Midodrine 5 mg bid. As discussed with Dr. Donata ClayVan Trigt,  Apixaban to  started in the am and I will reduce ecasa to 81 mg daily. Will also stop Lovenox in the am. 2.  Pulmonary - On room air. Encourage incentive spirometer. 3. Volume Overload - On Lasix 40 mg daily. 4.  Acute blood loss anemia - H and H this am 10.3 and 31.7 5. Remove EPW  Donielle M ZimmermanPA-C 03/14/2018,7:14 AM [phone]  Postop issues with afib, low BP and fatigue slowly improving Currently NSR Agree with above assessment and plan patient examined and medical record reviewed,agree with above note. Kathlee Nationseter Van Trigt III 03/14/2018

## 2018-03-14 NOTE — Progress Notes (Signed)
Subjective:  Events since surgery reviewed.  Currently feeling better.  Minimal chest pain.  Had atrial fibrillation yesterday but currently in sinus rhythm.   Objective:  Vital Signs in the last 24 hours: BP 106/66 (BP Location: Right Arm)   Pulse 75   Temp 97.9 F (36.6 C) (Oral)   Resp 16   Ht 5\' 10"  (1.778 m)   Wt 77.5 kg (170 lb 13.7 oz)   SpO2 93%   BMI 24.52 kg/m   Physical Exam: Pleasant male in NAD Neck: Right bruit, left carotid endarterectomy scar clean and dry Lungs:  Clear Cardiac:  Regular rhythm, normal S1 and S2, no S3, No rub Extremities:  No edema present  Intake/Output from previous day: 07/08 0701 - 07/09 0700 In: 240 [P.O.:240] Out: 550 [Urine:550]  Weight Filed Weights   03/12/18 1104 03/13/18 0447 03/14/18 0400  Weight: 81.9 kg (180 lb 8.9 oz) 79.1 kg (174 lb 6.1 oz) 77.5 kg (170 lb 13.7 oz)    Lab Results: Basic Metabolic Panel: Recent Labs    03/13/18 0323 03/14/18 0450  NA 134* 134*  K 3.8 4.1  CL 92* 93*  CO2 33* 29  GLUCOSE 113* 120*  BUN 17 24*  CREATININE 0.94 1.05   CBC: Recent Labs    03/13/18 0323 03/14/18 0450  WBC 9.2 10.3  HGB 9.9* 10.3*  HCT 30.4* 31.7*  MCV 95.9 97.5  PLT 240 281   Telemetry: Sinus rhythm  Assessment/Plan:  1. Recovering well from recent bypass and carotid endarterectomy 2.  Postoperative atrial fibrillation starting on Eliquis today 3.  Postop anemia  Rec:  Eliquis to start today with lower dose aspirin.  Blood pressure still on the low side and was started on midodrine to support blood pressure postop.  Watch today and continue increasing ambulation.    Zachary Ponce, Jr.  MD [facility] Cardiology  03/14/2018, 8:58 AM

## 2018-03-15 ENCOUNTER — Other Ambulatory Visit: Payer: Self-pay

## 2018-03-15 DIAGNOSIS — I6523 Occlusion and stenosis of bilateral carotid arteries: Secondary | ICD-10-CM

## 2018-03-15 DIAGNOSIS — Z48812 Encounter for surgical aftercare following surgery on the circulatory system: Secondary | ICD-10-CM

## 2018-03-15 MED ORDER — AMIODARONE HCL 200 MG PO TABS
200.0000 mg | ORAL_TABLET | Freq: Two times a day (BID) | ORAL | Status: DC
  Administered 2018-03-15 – 2018-03-16 (×3): 200 mg via ORAL
  Filled 2018-03-15 (×3): qty 1

## 2018-03-15 MED ORDER — ATORVASTATIN CALCIUM 20 MG PO TABS
20.0000 mg | ORAL_TABLET | Freq: Every day | ORAL | Status: DC
  Administered 2018-03-15: 20 mg via ORAL
  Filled 2018-03-15: qty 1

## 2018-03-15 MED ORDER — ATORVASTATIN CALCIUM 40 MG PO TABS: 40.0000 mg | ORAL_TABLET | Freq: Every day | ORAL | Status: DC

## 2018-03-15 MED ORDER — ASPIRIN EC 81 MG PO TBEC
81.0000 mg | DELAYED_RELEASE_TABLET | Freq: Every day | ORAL | Status: DC
  Administered 2018-03-15 – 2018-03-16 (×2): 81 mg via ORAL
  Filled 2018-03-15 (×2): qty 1

## 2018-03-15 MED FILL — Mannitol IV Soln 20%: INTRAVENOUS | Qty: 500 | Status: AC

## 2018-03-15 MED FILL — Lidocaine HCl(Cardiac) IV PF Soln Pref Syr 100 MG/5ML (2%): INTRAVENOUS | Qty: 5 | Status: AC

## 2018-03-15 MED FILL — Sodium Chloride IV Soln 0.9%: INTRAVENOUS | Qty: 2000 | Status: AC

## 2018-03-15 MED FILL — Electrolyte-R (PH 7.4) Solution: INTRAVENOUS | Qty: 5000 | Status: AC

## 2018-03-15 MED FILL — Sodium Bicarbonate IV Soln 8.4%: INTRAVENOUS | Qty: 50 | Status: AC

## 2018-03-15 MED FILL — Albumin, Human Inj 5%: INTRAVENOUS | Qty: 250 | Status: AC

## 2018-03-15 MED FILL — Heparin Sodium (Porcine) Inj 1000 Unit/ML: INTRAMUSCULAR | Qty: 10 | Status: AC

## 2018-03-15 NOTE — Discharge Instructions (Signed)
Discharge Instructions:  1. You may shower, please wash incisions daily with soap and water and keep dry.  If you wish to cover wounds with dressing you may do so but please keep clean and change daily.  No tub baths or swimming until incisions have completely healed.  If your incisions become red or develop any drainage please call our office at [phone]  2. No Driving until cleared by Dr. Zenaida NieceVan Trigt's office and you are no longer using narcotic pain medications  3. Monitor your weight daily.. Please use the same scale and weigh at same time... If you gain 5-10 lbs in 48 hours with associated lower extremity swelling, please contact our office at [phone]  4. Fever of 101.5 for at least 24 hours with no source, please contact our office at [phone]    Information on my medicine - ELIQUIS (apixaban)  Why was Eliquis prescribed for you? Eliquis was prescribed for you to reduce the risk of forming blood clots that can cause a stroke if you have a medical condition called atrial fibrillation (a type of irregular heartbeat) OR to reduce the risk of a blood clots forming after orthopedic surgery.  What do You need to know about Eliquis ? Take your Eliquis TWICE DAILY - one tablet in the morning and one tablet in the evening with or without food.  It would be best to take the doses about the same time each day.  If you have difficulty swallowing the tablet whole please discuss with your pharmacist how to take the medication safely.  Take Eliquis exactly as prescribed by your doctor and DO NOT stop taking Eliquis without talking to the doctor who prescribed the medication.  Stopping may increase your risk of developing a new clot or stroke.  Refill your prescription before you run out.  After discharge, you should have regular check-up appointments with your healthcare provider that is prescribing your Eliquis.  In the future your dose may need to be changed if your kidney  function or weight changes by a significant amount or as you get older.  What do you do if you miss a dose? If you miss a dose, take it as soon as you remember on the same day and resume taking twice daily.  Do not take more than one dose of ELIQUIS at the same time.  Important Safety Information A possible side effect of Eliquis is bleeding. You should call your healthcare provider right away if you experience any of the following: ? Bleeding from an injury or your nose that does not stop. ? Unusual colored urine (red or dark brown) or unusual colored stools (red or black). ? Unusual bruising for unknown reasons. ? A serious fall or if you hit your head (even if there is no bleeding).  Some medicines may interact with Eliquis and might increase your risk of bleeding or clotting while on Eliquis. To help avoid this, consult your healthcare provider or pharmacist prior to using any new prescription or non-prescription medications, including herbals, vitamins, non-steroidal anti-inflammatory drugs (NSAIDs) and supplements.  This website has more information on Eliquis (apixaban): [email or web address].cyEliquis.com.    5. Activity- up as tolerated, please walk at least 3 times per day.  Avoid strenuous activity, no lifting, pushing, or pulling with your arms over 8-10 lbs for a minimum of 6 weeks  6. If any questions or concerns arise, please do not hesitate to contact our office at [phone]

## 2018-03-15 NOTE — Progress Notes (Signed)
CARDIAC REHAB PHASE I   PRE:  Rate/Rhythm: 76 SR  BP:  Supine:   Sitting: 110/55  Standing:    SaO2: 99%RA  MODE:  Ambulation: 670 ft   POST:  Rate/Rhythm: 93 SR  BP:  Supine:   Sitting: 126/74  Standing:    SaO2: 93%RA 1005-1105 Pt walked 670 ft on RA with hand held asst. Gait a little wobbly at times but pt stated he walked to one side prior to admission. Would recommend family member walking beside until steadier. Education completed with pt and stepmother who voiced understanding. Stepmother took notes. Reviewed sternal precautions, IS, ex ed, heart healthy diet, and CRP 2. Referred to [place] CRP 2. Wrote down how to view discharge video. Case [facility]. [facility].    Luetta Nuttingharlene Brande Uncapher, RN BSN  03/15/2018 11:02 AM

## 2018-03-15 NOTE — Care Management Note (Signed)
Case Management Note Donn PieriniKristi Khasir Woodrome RN, BSN Unit 4E-Case Manager [phone]  Patient Details  Name: Jocelyn LamerDouglas S Ralphs MRN: [id number] Date of Birth: [birth year]  Subjective/Objective:    Pt admitted with c/p - s/p CABG on 03/08/18                Action/Plan: PTA pt lived at home with wife- orders have been placed for HHRN/PT- spoke with pt at bedside- choice offered for agency- pt request list be left to review with wife- CM will f/u for choice prior to discharge- per pt he has RW and 3n1 at home- family to confirm- CM will follow for transition of care needs  Expected Discharge Date:                  Expected Discharge Plan:  Home w Home Health Services  In-House Referral:  NA  Discharge planning Services  CM Consult  Post Acute Care Choice:  Home Health Choice offered to:  Patient, Spouse  DME Arranged:    DME Agency:  NA  HH Arranged:  RN, PT HH Agency:  [facility]  Status of Service:  Completed, signed off  If discussed at Long Length of Stay Meetings, dates discussed:    Discharge Disposition: home/home health   Additional Comments:  03/15/18- 1315- Donn PieriniKristi Shannia Jacuinde RN, CM- follow up done with pt and wife at the bedside [facility]- [facility]- wife also confirms that they have all needed DME at home. Pt and wife are to follow up with insurance provided for long term health plan that may cover long term care or ALF assistance needs. Referral called to [facility] for HHRN/PT services.   Darrold SpanWebster, Lida Berkery Hall, RN 03/15/2018, 1:28 PM

## 2018-03-15 NOTE — Progress Notes (Signed)
Subjective:  Feeling much stronger today.  Able to ambulate with a walker today.feeling much stronger today.  Able to ambulate without the walker today.  Minimal chest pain or soreness.  Denies shortness of breath.  Objective:  Vital Signs in the last 24 hours: BP (!) 110/55 (BP Location: Left Arm)   Pulse 78   Temp 98.2 F (36.8 C) (Oral)   Resp 19   Ht 5\' 10"  (1.778 m)   Wt 77.2 kg (170 lb 3.1 oz)   SpO2 99%   BMI 24.42 kg/m   Physical Exam: Pleasant male in NAD Neck: Right bruit, left carotid endarterectomy scar clean and dry Lungs:  Clear Cardiac:  Regular rhythm, normal S1 and S2, no S3, No rub Extremities:  No edema present  Intake/Output from previous day: 07/09 0701 - 07/10 0700 In: 1080 [P.O.:1080] Out: 2000 [Urine:2000]  Weight Filed Weights   03/13/18 0447 03/14/18 0400 03/15/18 0447  Weight: 79.1 kg (174 lb 6.1 oz) 77.5 kg (170 lb 13.7 oz) 77.2 kg (170 lb 3.1 oz)    Lab Results: Basic Metabolic Panel: Recent Labs    03/13/18 0323 03/14/18 0450  NA 134* 134*  K 3.8 4.1  CL 92* 93*  CO2 33* 29  GLUCOSE 113* 120*  BUN 17 24*  CREATININE 0.94 1.05   CBC: Recent Labs    03/13/18 0323 03/14/18 0450  WBC 9.2 10.3  HGB 9.9* 10.3*  HCT 30.4* 31.7*  MCV 95.9 97.5  PLT 240 281   Telemetry: Sinus rhythm  Assessment/Plan:  1. Recovering well from recent bypass and carotid endarterectomy looks to be stable for discharge tomorrow.   Looks to be stable for discharge tomorrow. 2.  Postoperative atrial fibrillation   Currently suppressed on amiodarone-currently anticoagulated 3.  Postop anemia 4.  Volume overload appears to be back to baseline.  Rec:    Tentative plans for discharge are for tomorrow.    Darden PalmerW. Spencer Tilley, Jr.  MD [facility] Cardiology  03/15/2018, 11:29 AM

## 2018-03-15 NOTE — Progress Notes (Signed)
[facility]  Subjective  - feels ok   Objective (!) 110/55 78 98.2 F (36.8 C) (Oral) 19 99%  Intake/Output Summary (Last 24 hours) at 03/15/2018 1610 Last data filed at 03/15/2018 1228 Gross per 24 hour  Intake 1200 ml  Output 1775 ml  Net -575 ml   Small hematoma left neck Otherwise incision looks good Tongue midline UE/LE 5/5 motor  Assessment/Planning: Doing well post left CEA Has follow up 7/31 Has discharge plans for tomorrow  Fabienne BrunsCharles Fields 03/15/2018 4:10 PM --  Laboratory Lab Results: Recent Labs    03/13/18 0323 03/14/18 0450  WBC 9.2 10.3  HGB 9.9* 10.3*  HCT 30.4* 31.7*  PLT 240 281   BMET Recent Labs    03/13/18 0323 03/14/18 0450  NA 134* 134*  K 3.8 4.1  CL 92* 93*  CO2 33* 29  GLUCOSE 113* 120*  BUN 17 24*  CREATININE 0.94 1.05  CALCIUM 9.0 9.0    COAG Lab Results  Component Value Date   INR 1.02 03/14/2018   INR 1.38 03/08/2018   INR 1.07 03/04/2018   No results found for: PTT

## 2018-03-15 NOTE — Progress Notes (Addendum)
      [address]       [place] [id number]             [phone]        7 Days Post-Op Procedure(s) (LRB): CORONARY ARTERY BYPASS GRAFTING (CABG) X4, RIGHT SAPHENOUS VEIN HARVEST. LIMA TO LAD, SVG TO OM, SVG TO RAMUS, SVG TO PD. (N/A) TRANSESOPHAGEAL ECHOCARDIOGRAM (TEE) (N/A) ENDARTERECTOMY CAROTID (Left)  Subjective: Patient awake this am. He has no complaints this am.  Objective: Vital signs in last 24 hours: Temp:  [97.8 F (36.6 C)-98.6 F (37 C)] 97.8 F (36.6 C) (07/10 0451) Pulse Rate:  [65-113] 66 (07/10 0451) Cardiac Rhythm: Normal sinus rhythm (07/09 1913) Resp:  [15-27] 24 (07/10 0451) BP: (93-127)/(58-74) 104/58 (07/10 0451) SpO2:  [87 %-98 %] 94 % (07/10 0451) Weight:  [170 lb 3.1 oz (77.2 kg)] 170 lb 3.1 oz (77.2 kg) (07/10 0447)  Pre op weight 77.6 kg Current Weight  03/15/18 170 lb 3.1 oz (77.2 kg)      Intake/Output from previous day: 07/09 0701 - 07/10 0700 In: 1080 [P.O.:1080] Out: 2000 [Urine:2000]   Physical Exam:  Cardiovascular: RRR Pulmonary: Clear to auscultation bilaterally Abdomen: Soft, non tender, bowel sounds present. Extremities: Trace bilateral lower extremity edema. Ecchymosis right thigh. Wounds: Clean and dry.  No erythema or signs of infection.  Lab Results: CBC: Recent Labs    03/13/18 0323 03/14/18 0450  WBC 9.2 10.3  HGB 9.9* 10.3*  HCT 30.4* 31.7*  PLT 240 281   BMET:  Recent Labs    03/13/18 0323 03/14/18 0450  NA 134* 134*  K 3.8 4.1  CL 92* 93*  CO2 33* 29  GLUCOSE 113* 120*  BUN 17 24*  CREATININE 0.94 1.05  CALCIUM 9.0 9.0    PT/INR:  Lab Results  Component Value Date   INR 1.02 03/14/2018   INR 1.38 03/08/2018   INR 1.07 03/04/2018   ABG:  INR: Will add last result for INR, ABG once components are confirmed Will add last 4 CBG results once components are confirmed  Assessment/Plan:  1. CV - Previous a fib. First degree heart block, SR this am. On Amiodarone 400  mg bid, Lopressor 12.5 mg bid, Midodrine 5 mg bid. As discussed with Dr. Donata ClayVan Trigt,  Apixaban to  started in the am and I will reduce ecasa to 81 mg daily. Will decrease Amiodarone to 200 mg bid. 2.  Pulmonary - On room air. Encourage incentive spirometer. 3. Volume Overload - On Lasix 40 mg daily. 4.  Acute blood loss anemia - H and H this am 10.3 and 31.7 5. Will start Lipitor as not on statin 6. As discussed with Dr. Donata ClayVan Trigt, will likely discharge in am  Donielle M ZimmermanPA-C 03/15/2018,7:18 AM [phone]  Looks good today in sinus rhythm Titrating medications to optimize care prior to discharge Discharge instructions reviewed with patient eluding activity limitations wound care diet and postop follow-up.patient examined and medical record reviewed,agree with above note. Kathlee Nationseter Van Trigt III 03/15/2018

## 2018-03-15 NOTE — Care Management Important Message (Signed)
Important Message  Patient Details  Name: Zachary LamerDouglas S Rountree MRN: [id number] Date of Birth: 1951-03-22   Medicare Important Message Given:  Yes    Cyrus Ramsburg P Kida Digiulio 03/15/2018, 10:07 AM

## 2018-03-16 ENCOUNTER — Other Ambulatory Visit: Payer: Self-pay | Admitting: Physician Assistant

## 2018-03-16 MED ORDER — ACETAMINOPHEN 325 MG PO TABS: 650.0000 mg | ORAL_TABLET | Freq: Four times a day (QID) | ORAL | Status: DC | PRN

## 2018-03-16 MED ORDER — MIDODRINE HCL 5 MG PO TABS: 5.0000 mg | ORAL_TABLET | Freq: Two times a day (BID) | ORAL | 1 refills | Status: DC

## 2018-03-16 MED ORDER — APIXABAN 5 MG PO TABS: 5.0000 mg | ORAL_TABLET | Freq: Two times a day (BID) | ORAL | 3 refills | Status: DC

## 2018-03-16 MED ORDER — FUROSEMIDE 40 MG PO TABS: 40.0000 mg | ORAL_TABLET | Freq: Every day | ORAL | 0 refills | Status: DC

## 2018-03-16 MED ORDER — ASPIRIN 81 MG PO TBEC: 81.0000 mg | DELAYED_RELEASE_TABLET | Freq: Every day | ORAL | Status: DC

## 2018-03-16 MED ORDER — METOPROLOL TARTRATE 25 MG PO TABS: 12.5000 mg | ORAL_TABLET | Freq: Two times a day (BID) | ORAL | 3 refills | Status: DC

## 2018-03-16 MED ORDER — POTASSIUM CHLORIDE CRYS ER 20 MEQ PO TBCR: 20.0000 meq | EXTENDED_RELEASE_TABLET | Freq: Every day | ORAL | 0 refills | Status: DC

## 2018-03-16 MED ORDER — ATORVASTATIN CALCIUM 20 MG PO TABS: 20.0000 mg | ORAL_TABLET | Freq: Every day | ORAL | 3 refills | Status: DC

## 2018-03-16 MED ORDER — TRAMADOL HCL 50 MG PO TABS: 50.0000 mg | ORAL_TABLET | ORAL | 0 refills | Status: DC | PRN

## 2018-03-16 MED ORDER — AMIODARONE HCL 200 MG PO TABS: 200.0000 mg | ORAL_TABLET | Freq: Two times a day (BID) | ORAL | 1 refills | Status: DC

## 2018-03-16 NOTE — Progress Notes (Signed)
Pt/family given discharge instructions, medication lists, follow up appointments, and when to call the doctor. All paperwork and prescriptions placed in discharge envelope and given to wife.  Pt/family verbalizes understanding. Thomas HoffBurton, Kieran Nachtigal McClintock, RN

## 2018-03-16 NOTE — Progress Notes (Signed)
Subjective:  No atrial fibrillation overnight.  Ambulatory in hall and ready for discharge today.  Weight down.  Objective:  Vital Signs in the last 24 hours: BP 100/62 (BP Location: Right Arm)   Pulse (!) 125   Temp 97.9 F (36.6 C) (Oral)   Resp 17   Ht 5\' 10"  (1.778 m)   Wt 77 kg (169 lb 12.1 oz)   SpO2 95%   BMI 24.36 kg/m   Physical Exam: Pleasant male in NAD Neck: Right bruit, left carotid endarterectomy scar clean and dry Lungs:  Clear Cardiac:  Regular rhythm, normal S1 and S2, no S3, No rub Extremities:  No edema present  Intake/Output from previous day: 07/10 0701 - 07/11 0700 In: 600 [P.O.:600] Out: 725 [Urine:725]  Weight Filed Weights   03/14/18 0400 03/15/18 0447 03/16/18 0429  Weight: 77.5 kg (170 lb 13.7 oz) 77.2 kg (170 lb 3.1 oz) 77 kg (169 lb 12.1 oz)    Lab Results: Basic Metabolic Panel: Recent Labs    03/14/18 0450  NA 134*  K 4.1  CL 93*  CO2 29  GLUCOSE 120*  BUN 24*  CREATININE 1.05   CBC: Recent Labs    03/14/18 0450  WBC 10.3  HGB 10.3*  HCT 31.7*  MCV 97.5  PLT 281   Telemetry: Sinus rhythm  Assessment/Plan:  1. Recovering well from recent bypass and carotid endarterectomy -stable for discharge today 2.  Postoperative atrial fibrillation -Currently in sinus rhythm with no recurrence over the past couple of days.  Currently on amiodarome and Eliquis.  3.  Postop anemia 4.  Volume overload appears to be back to baseline.  Rec:  Home today.  Follow-up with me in 2 weeks and call if problems.   Darden PalmerW. Spencer Tameca Jerez, Jr.  MD [facility]  03/16/2018, 8:49 AM

## 2018-03-16 NOTE — Progress Notes (Signed)
Removed CT sutures and applied benzoin and 1/2 " steri strips. Pt had one suture in left lower neck area removed and band aid applied. Pt tolerated procedure well. Pt given signs and symptoms of infection.

## 2018-03-16 NOTE — Progress Notes (Signed)
CARDIAC REHAB PHASE I   PRE:  Rate/Rhythm: 75 SR  MODE:  Ambulation: 470 ft   POST:  Rate/Rhythm: 92 SR  BP:  Supine:   Sitting: 129/61  Standing:    SaO2: 100%RA [id number] Observed pt getting up and walking to bathroom. He adheres to sternal precautions. Pt then walked 470 ft on RA with me just walking beside. He gets a little wobbly when he looks around. Encouraged him to look straight ahead and to have family walk with him until he feels comfortable and steady enough to walk alone. Pt stated his wife will be available 24/7.  No questions per wife re ed. Pt would benefit from HHPT. Wife stated she walked with him twice yesterday.   Luetta Nuttingharlene Shermaine Brigham, RN BSN  03/16/2018 9:11 AM

## 2018-03-16 NOTE — Progress Notes (Signed)
      [address]       [place] [id number]             [phone]      8 Days Post-Op Procedure(s) (LRB): CORONARY ARTERY BYPASS GRAFTING (CABG) X4, RIGHT SAPHENOUS VEIN HARVEST. LIMA TO LAD, SVG TO OM, SVG TO RAMUS, SVG TO PD. (N/A) TRANSESOPHAGEAL ECHOCARDIOGRAM (TEE) (N/A) ENDARTERECTOMY CAROTID (Left)   Subjective:  No new complaints.  States xanax last night has made me feel weird.  Objective: Vital signs in last 24 hours: Temp:  [97.9 F (36.6 C)-98.3 F (36.8 C)] 97.9 F (36.6 C) (07/11 0429) Pulse Rate:  [68-125] 125 (07/11 0429) Cardiac Rhythm: Normal sinus rhythm (07/11 0700) Resp:  [14-21] 21 (07/11 0429) BP: (100-122)/(55-64) 100/62 (07/11 0429) SpO2:  [95 %-99 %] 95 % (07/11 0429) Weight:  [169 lb 12.1 oz (77 kg)] 169 lb 12.1 oz (77 kg) (07/11 0429)  Intake/Output from previous day: 07/10 0701 - 07/11 0700 In: 600 [P.O.:600] Out: 725 [Urine:725]  General appearance: alert, cooperative and no distress Heart: regular rate and rhythm Lungs: clear to auscultation bilaterally Abdomen: soft, non-tender; bowel sounds normal; no masses,  no organomegaly Extremities: edema trace Wound: clean and dry, ecchymosis in RLE  Lab Results: Recent Labs    03/14/18 0450  WBC 10.3  HGB 10.3*  HCT 31.7*  PLT 281   BMET:  Recent Labs    03/14/18 0450  NA 134*  K 4.1  CL 93*  CO2 29  GLUCOSE 120*  BUN 24*  CREATININE 1.05  CALCIUM 9.0    PT/INR:  Recent Labs    03/14/18 0450  LABPROT 13.3  INR 1.02   ABG    Component Value Date/Time   PHART 7.307 (L) 03/08/2018 2228   HCO3 22.6 03/08/2018 2228   TCO2 24 03/10/2018 1600   ACIDBASEDEF 4.0 (H) 03/08/2018 2228   O2SAT 99.0 03/08/2018 2228   CBG (last 3)  No results for input(s): GLUCAP in the last 72 hours.  Assessment/Plan: S/P Procedure(s) (LRB): CORONARY ARTERY BYPASS GRAFTING (CABG) X4, RIGHT SAPHENOUS VEIN HARVEST. LIMA TO LAD, SVG TO OM, SVG TO RAMUS, SVG TO PD.  (N/A) TRANSESOPHAGEAL ECHOCARDIOGRAM (TEE) (N/A) ENDARTERECTOMY CAROTID (Left)  1. CV- Previous A. Fib, NSR currently- continue Amiodarone, Lopressor, Midodrine, and Eliquis 2. Pulm- no acute issues, continue IS 3. Renal- creatinine stable, weight is trending down, will taper lasix over the next week. 4. Expected post operative blood loss anemia, mild at 10.3 5. Dispo- patient stable, will d/c home today   LOS: 13 days    Zachary Ponce Zachary Ponce 03/16/2018

## 2018-03-17 ENCOUNTER — Telehealth (HOSPITAL_COMMUNITY): Payer: Self-pay

## 2018-03-17 DIAGNOSIS — Z951 Presence of aortocoronary bypass graft: Secondary | ICD-10-CM | POA: Diagnosis not present

## 2018-03-17 NOTE — Telephone Encounter (Signed)
Wife of patient returned phone call in regards to Cardiac Rehab - Wife stated patient is interested in the program. Explained scheduling process and went over insurance with wife, she verbalized understanding. Will contact patient once follow up appt has been completed upon review by the RN Navigator.

## 2018-03-17 NOTE — Telephone Encounter (Signed)
Attempted to contact patient to see if he is interested in the Cardiac Rehab Program - lm on vm °

## 2018-03-17 NOTE — Telephone Encounter (Signed)
Patients insurance is active and benefits verified through [facility] - $20.00 co-pay, no deductible, out of pocket amount of $4,000/$775.21 has been met, no co-insurance, and no pre-authorization is required. Passport/reference [phone]  Will contact patient to see if he is interested in the Cardiac Rehab Program. If interested, patient will need to complete follow up appt. Once completed, patient will be contacted for scheduling upon review by the RN Navigator.

## 2018-03-21 ENCOUNTER — Telehealth: Payer: Self-pay

## 2018-03-21 NOTE — Telephone Encounter (Signed)
Attempted to call Zachary Ponce [phone] with [facility] to get verbal orders for home health.  Unable to leave VM.

## 2018-04-05 ENCOUNTER — Ambulatory Visit (HOSPITAL_COMMUNITY)
Admit: 2018-04-05 | Discharge: 2018-04-05 | Disposition: A | Discharge status: Home / Self Care | Payer: Medicare Other | Attending: Family | Admitting: Family

## 2018-04-05 DIAGNOSIS — Z951 Presence of aortocoronary bypass graft: Secondary | ICD-10-CM | POA: Insufficient documentation

## 2018-04-05 DIAGNOSIS — I6523 Occlusion and stenosis of bilateral carotid arteries: Secondary | ICD-10-CM

## 2018-04-05 DIAGNOSIS — E785 Hyperlipidemia, unspecified: Secondary | ICD-10-CM | POA: Insufficient documentation

## 2018-04-05 DIAGNOSIS — I2510 Atherosclerotic heart disease of native coronary artery without angina pectoris: Secondary | ICD-10-CM | POA: Insufficient documentation

## 2018-04-05 DIAGNOSIS — Z48812 Encounter for surgical aftercare following surgery on the circulatory system: Secondary | ICD-10-CM

## 2018-04-06 ENCOUNTER — Ambulatory Visit (INDEPENDENT_AMBULATORY_CARE_PROVIDER_SITE_OTHER): Payer: Medicare Other | Admitting: Vascular Surgery

## 2018-04-06 ENCOUNTER — Other Ambulatory Visit: Payer: Self-pay

## 2018-04-06 ENCOUNTER — Encounter: Payer: Self-pay | Admitting: Vascular Surgery

## 2018-04-06 VITALS — BP 104/68 | HR 74 | Temp 97.1°F | Resp 18 | Ht 70.5 in | Wt 168.0 lb

## 2018-04-06 DIAGNOSIS — I6523 Occlusion and stenosis of bilateral carotid arteries: Secondary | ICD-10-CM

## 2018-04-06 NOTE — Progress Notes (Addendum)
Postoperative Visit   History of Present Illness   Zachary Ponce is a 67 y.o. male who presents for postoperative follow-up for: left CEA by Dr. Darrick PennaFields in combination with CABG by Dr. Morton PetersVan Tright (Date: 03/08/18).  The patient's neck incision is healed.  The patient denies any stroke or TIA symptoms including slurring speech, changes in vision, or one sided weakness.  Pre-operatively carotid duplex also demonstrated a critical R ICA stenosis.  Carotid duplex performed yesterday showed that R ICA velocities improved some and stenosis is now estimated to be 60-79%.  He is taking aspirin and plavix daily.  He will follow up with Dr. Morton PetersVan Tright next week.  Current Outpatient Medications  Medication Sig Dispense Refill  . acetaminophen (TYLENOL) 325 MG tablet Take 2 tablets (650 mg total) by mouth every 6 (six) hours as needed for mild pain.    [name]. amiodarone (PACERONE) 200 MG tablet Take 1 tablet (200 mg total) by mouth 2 (two) times daily. 60 tablet 1  . apixaban (ELIQUIS) 5 MG TABS tablet Take 1 tablet (5 mg total) by mouth 2 (two) times daily. 60 tablet 3  . aspirin EC 81 MG EC tablet Take 1 tablet (81 mg total) by mouth daily.    [name]. atorvastatin (LIPITOR) 20 MG tablet Take 1 tablet (20 mg total) by mouth daily at 6 PM. 30 tablet 3  . buPROPion (WELLBUTRIN XL) 300 MG 24 hr tablet Take 300 mg by mouth daily.    [name]. escitalopram (LEXAPRO) 5 MG tablet Take 5 mg by mouth daily.  0  . metoprolol tartrate (LOPRESSOR) 25 MG tablet Take 0.5 tablets (12.5 mg total) by mouth 2 (two) times daily. 30 tablet 3  . midodrine (PROAMATINE) 5 MG tablet Take 1 tablet (5 mg total) by mouth 2 (two) times daily with a meal. 60 tablet 1  . NON FORMULARY Apply 50 mg topically daily. Testosterone 50mg /ml  0  . VYVANSE 30 MG capsule Take 30 mg by mouth daily.  0  . furosemide (LASIX) 40 MG tablet Take 1 tablet (40 mg total) by mouth daily. X 7 days (Patient not taking: Reported on 04/06/2018) 7 tablet 0  . potassium  chloride (K-DUR,KLOR-CON) 20 MEQ tablet Take 1 tablet (20 mEq total) by mouth daily. X 7 days (Patient not taking: Reported on 04/06/2018) 7 tablet 0  . traMADol (ULTRAM) 50 MG tablet Take 1 tablet (50 mg total) by mouth every 4 (four) hours as needed for moderate pain. (Patient not taking: Reported on 04/06/2018) 30 tablet 0   No current facility-administered medications for this visit.     For VQI Use Only   PRE-ADM LIVING: Home  AMB STATUS: Ambulatory   Physical Examination   Vitals:   04/06/18 1541 04/06/18 1545  BP: 120/76 104/68  Pulse: 72 74  Resp: 18   Temp: (!) 97.1 F (36.2 C)   TempSrc: Oral   SpO2: 98%   Weight: 168 lb (76.2 kg)   Height: 5' 10.5" (1.791 m)     left Neck: Incision is healed  Neuro: CN 2-12 are intact, Motor strength is 5/5 bilaterally, sensation is grossly intact   Medical Decision Making   Zachary Ponce is a 67 y.o. male who presents s/p left CEA.   This patient was seen in conjunction with Dr. Darrick PennaFields  The neck incision is healing well with no stroke symptoms.  Carotid duplex performed yesterday demonstrates slight decrease in velocities compared to pre-operative scan, which now classifies  R ICA stenosis in 60-79% range.  Patient was offered R CEA as originally planned  Patient and wife would like to hold off on surgery for now, which Dr. Darrick Penna believes is a reasonable option  Patient and wife were educated on stroke like symptoms and to seek immediate medical attention if these present  Continue aspirin and plavix  Follow up in 6 months with repeat carotid duplex  Emilie Rutter PA-C [facility] Office: [phone]  Agree with above.  Pt velocities decreased somewhat with contralateral CEA.  Will defer right CEA for now at pt request.  He is a asymptomatic.  He will return sooner if he gets symptoms.  Fabienne Bruns, MD [facility] Office: [phone] Pager:  [phone]

## 2018-04-10 ENCOUNTER — Other Ambulatory Visit: Payer: Self-pay | Admitting: Cardiothoracic Surgery

## 2018-04-10 ENCOUNTER — Telehealth (HOSPITAL_COMMUNITY): Payer: Self-pay

## 2018-04-10 DIAGNOSIS — Z951 Presence of aortocoronary bypass graft: Secondary | ICD-10-CM

## 2018-04-10 NOTE — Telephone Encounter (Signed)
Called patient to schedule orientation for cardiac rehab. Scheduled orientation on 05/23/18 at 1:30pm. Patient will attend the 11:15am exc class. Mailed packet.

## 2018-04-11 ENCOUNTER — Ambulatory Visit
Admission: RE | Admit: 2018-04-11 | Discharge: 2018-04-11 | Disposition: A | Discharge status: Home / Self Care | Payer: Medicare Other | Source: Ambulatory Visit | Attending: Cardiothoracic Surgery | Admitting: Cardiothoracic Surgery

## 2018-04-11 ENCOUNTER — Other Ambulatory Visit: Payer: Self-pay

## 2018-04-11 ENCOUNTER — Ambulatory Visit (INDEPENDENT_AMBULATORY_CARE_PROVIDER_SITE_OTHER): Payer: Self-pay | Admitting: Surgical

## 2018-04-11 VITALS — BP 112/73 | HR 73 | Resp 18 | Ht 70.5 in | Wt 164.0 lb

## 2018-04-11 DIAGNOSIS — Z951 Presence of aortocoronary bypass graft: Secondary | ICD-10-CM

## 2018-04-11 NOTE — Patient Instructions (Signed)
Discussed activity progression including driving restrictions.

## 2018-04-11 NOTE — Progress Notes (Signed)
[address]       [place] [address]             [phone]      Zachary Ponce [facility] Medical Record #[id number] Date of Birth: [birth year]  Referring: Othella Boyer, MD Primary Care: [name], MD Primary Cardiologist: Georga Hacking, MD   Chief Complaint:   POST OP FOLLOW UP  OPERATIVE REPORT  DATE OF PROCEDURE:  03/10/2018  PROCEDURE PERFORMED: 1.  Coronary artery bypass grafting x4 (left internal mammary artery to left anterior descending, saphenous vein graft to ramus intermedius, saphenous vein graft to obtuse marginal, saphenous vein graft to posterior descending). 2.  Endoscopic harvest of right leg greater saphenous vein.  SURGEON:  [name], MD  ASSISTANT:  Lowella Dandy, PA-C  ANESTHESIA:  General by Leslye Peer, MD   History of Present Illness:    The patient is a 67 year old male seen in the office on today's date and routine postsurgical follow-up.  Additionally at the time of surgery underwent a left carotid endarterectomy by Dr. Leonette Most fields.  He has been seen by them in is doing well in this regard.  He may require right carotid endarterectomy in the future but is being observed.  He is also been seen by Dr. Donnie Aho and is progressing nicely from a cardiology perspective.  He is scheduled for cardiac rehab mid September.  He denies fevers, chills or other constitutional symptoms.  He is not having any palpitations.  He denies any difficulties with his incisions.  He denies pedal edema.  He denies chest pain.  Overall he is quite pleased with his progress.      Past Medical History:  Diagnosis Date  . ADHD (attention deficit hyperactivity disorder) 03/03/2018  . CAD (coronary artery disease) 03/03/2018   Cardiac cath  03/03/18 70% distal left main, 99% proximal LAD, 40-50% proximal circumflex, 60% intermediate, 90% ostial right coronary artery, 55% LVEF with some mild distal anterior hypokinesis  .  Depression 06/30/2016  . Essential tremor   . History of traumatic head injury   . Hyperlipidemia 03/03/2018     Social History   Tobacco Use  Smoking Status Never Smoker  Smokeless Tobacco Never Used    Social History   Substance and Sexual Activity  Alcohol Use Not Currently     No Known Allergies  Current Outpatient Medications  Medication Sig Dispense Refill  . amiodarone (PACERONE) 200 MG tablet Take 1 tablet (200 mg total) by mouth 2 (two) times daily. (Patient taking differently: Take 200 mg by mouth daily. ) 60 tablet 1  . apixaban (ELIQUIS) 5 MG TABS tablet Take 1 tablet (5 mg total) by mouth 2 (two) times daily. 60 tablet 3  . aspirin EC 81 MG EC tablet Take 1 tablet (81 mg total) by mouth daily.    [name] atorvastatin (LIPITOR) 20 MG tablet Take 1 tablet (20 mg total) by mouth daily at 6 PM. 30 tablet 3  . buPROPion (WELLBUTRIN XL) 300 MG 24 hr tablet Take 300 mg by mouth daily.    [name] escitalopram (LEXAPRO) 5 MG tablet Take 5 mg by mouth daily.  0  . metoprolol tartrate (LOPRESSOR) 25 MG tablet Take 0.5 tablets (12.5 mg total) by mouth 2 (two) times daily. 30 tablet 3  . NON FORMULARY Apply 50 mg topically daily. Testosterone 50mg /ml  0  . VYVANSE 30 MG capsule Take 30 mg by mouth daily.  0  No current facility-administered medications for this visit.        Physical Exam: BP 112/73 (BP Location: Right Arm, Patient Position: Sitting, Cuff Size: Normal)   Pulse 73   Resp 18   Ht 5' 10.5" (1.791 m)   Wt 74.4 kg (164 lb)   SpO2 98% Comment: RA  BMI 23.20 kg/m   General appearance: alert, cooperative and no distress Heart: regular rate and rhythm Lungs: clear to auscultation bilaterally Abdomen: soft, non-tender; bowel sounds normal; no masses,  no organomegaly Extremities: No edema Wound: Incisions healing well without evidence of infection   Diagnostic Studies & Laboratory data:     Recent Radiology Findings:   Dg Chest 2 View  Result Date:  04/11/2018 CLINICAL DATA:  Status post coronary bypass grafting EXAM: CHEST - 2 VIEW COMPARISON:  03/13/2018 FINDINGS: Cardiac shadow is stable. Postsurgical changes are again seen. The lungs are well aerated bilaterally. Calcified granuloma is again noted in the upper right lung. No bony abnormality is seen. IMPRESSION: No acute abnormality noted. Electronically Signed   By: Alcide CleverMark  Lukens M.D.   On: 04/11/2018 14:31      Recent Lab Findings: Lab Results  Component Value Date   WBC 10.3 03/14/2018   HGB 10.3 (L) 03/14/2018   HCT 31.7 (L) 03/14/2018   PLT 281 03/14/2018   GLUCOSE 120 (H) 03/14/2018   ALT 18 03/04/2018   AST 18 03/04/2018   NA 134 (L) 03/14/2018   K 4.1 03/14/2018   CL 93 (L) 03/14/2018   CREATININE 1.05 03/14/2018   BUN 24 (H) 03/14/2018   CO2 29 03/14/2018   TSH 0.853 03/04/2018   INR 1.02 03/14/2018   HGBA1C 5.5 03/08/2018      Assessment / Plan: Patient is doing well.  There are no current surgical issues.  The chest x-ray was reviewed looks good.  We will see him again on a as needed basis for any surgically related issues or at request.   We had a lengthy discussion on lifestyle and nutrition.         Rowe ClackWayne E Madonna Flegal, PA-C 04/11/2018 2:48 PM Pager [phone]

## 2018-04-12 ENCOUNTER — Ambulatory Visit: Payer: Medicare Other | Admitting: Cardiothoracic Surgery

## 2018-04-24 ENCOUNTER — Other Ambulatory Visit: Payer: Self-pay

## 2018-04-24 DIAGNOSIS — I6523 Occlusion and stenosis of bilateral carotid arteries: Secondary | ICD-10-CM

## 2018-05-19 ENCOUNTER — Telehealth (HOSPITAL_COMMUNITY): Payer: Self-pay | Admitting: Pharmacist

## 2018-05-19 NOTE — Telephone Encounter (Signed)
Cardiac Rehab Medication Review by a Pharmacist  Does the patient  feel that his/her medications are working for him/her?  yes  Has the patient been experiencing any side effects to the medications prescribed?  no  Does the patient measure his/her own blood pressure or blood glucose at home?  no   Does the patient have any problems obtaining medications due to transportation or finances?   no  Understanding of regimen: good Understanding of indications: good Potential of compliance: good  Pharmacist comments: Patient does not endorse any issues with medications at this time. Patient states he no longer is taking amiodarone or metoprolol tartrate. He was recently put on propranolol 80 mg ER daily.  Otis Peakebecca M Francella Barnett, PharmD PGY1 Pharmacy Resident Phone: [phone] 05/19/2018 4:45 PM

## 2018-05-19 NOTE — Progress Notes (Signed)
Zachary Ponce 67 y.o. male DOB 1951/04/23 MRN [id number]       Nutrition  No diagnosis found. Past Medical History:  Diagnosis Date  . ADHD (attention deficit hyperactivity disorder) 03/03/2018  . CAD (coronary artery disease) 03/03/2018   Cardiac cath  03/03/18 70% distal left main, 99% proximal LAD, 40-50% proximal circumflex, 60% intermediate, 90% ostial right coronary artery, 55% LVEF with some mild distal anterior hypokinesis  . Depression 06/30/2016  . Essential tremor   . History of traumatic head injury   . Hyperlipidemia 03/03/2018   Meds reviewed.     Current Outpatient Medications (Cardiovascular):  .  amiodarone (PACERONE) 200 MG tablet, Take 1 tablet (200 mg total) by mouth 2 (two) times daily. (Patient taking differently: Take 200 mg by mouth daily. ) .  atorvastatin (LIPITOR) 20 MG tablet, Take 1 tablet (20 mg total) by mouth daily at 6 PM. .  metoprolol tartrate (LOPRESSOR) 25 MG tablet, Take 0.5 tablets (12.5 mg total) by mouth 2 (two) times daily.   Current Outpatient Medications (Analgesics):  .  aspirin EC 81 MG EC tablet, Take 1 tablet (81 mg total) by mouth daily.  Current Outpatient Medications (Hematological):  .  apixaban (ELIQUIS) 5 MG TABS tablet, Take 1 tablet (5 mg total) by mouth 2 (two) times daily.  Current Outpatient Medications (Other):  [name].  buPROPion (WELLBUTRIN XL) 300 MG 24 hr tablet, Take 300 mg by mouth daily. [name].  escitalopram (LEXAPRO) 5 MG tablet, Take 5 mg by mouth daily. .  NON FORMULARY, Apply 50 mg topically daily. Testosterone 50mg /ml .  VYVANSE 30 MG capsule, Take 30 mg by mouth daily.   HT: Ht Readings from Last 1 Encounters:  04/11/18 5' 10.5" (1.791 m)    WT: Wt Readings from Last 5 Encounters:  04/11/18 164 lb (74.4 kg)  04/06/18 168 lb (76.2 kg)  03/16/18 169 lb 12.1 oz (77 kg)     BMI 23.19 (04/11/18)  Current tobacco use? No       Labs:  Lipid Panel  No results found for: CHOL, TRIG, HDL, CHOLHDL, VLDL, LDLCALC,  LDLDIRECT  Lab Results  Component Value Date   HGBA1C 5.5 03/08/2018   CBG (last 3)  No results for input(s): GLUCAP in the last 72 hours.  Nutrition Diagnosis ? Food-and nutrition-related knowledge deficit related to lack of exposure to information as related to diagnosis of: ? CVD   Nutrition Goal(s):  ? To be determined  Plan:  Pt to attend nutrition classes ? Nutrition I ? Nutrition II ? Portion Distortion  Will provide client-centered nutrition education as part of interdisciplinary care.   Monitor and evaluate progress toward nutrition goal with team.  Ross MarcusAubrey Burklin, MS, RD, LDN 05/19/2018 1:26 PM

## 2018-05-23 ENCOUNTER — Encounter (HOSPITAL_COMMUNITY)
Admission: RE | Admit: 2018-05-23 | Discharge: 2018-05-23 | Disposition: A | Discharge status: Home / Self Care | Payer: Medicare Other | Source: Ambulatory Visit | Attending: Cardiology | Admitting: Cardiology

## 2018-05-23 VITALS — BP 102/62 | HR 68 | Ht 70.0 in | Wt 168.0 lb

## 2018-05-23 DIAGNOSIS — Z7901 Long term (current) use of anticoagulants: Secondary | ICD-10-CM | POA: Insufficient documentation

## 2018-05-23 DIAGNOSIS — F909 Attention-deficit hyperactivity disorder, unspecified type: Secondary | ICD-10-CM | POA: Insufficient documentation

## 2018-05-23 DIAGNOSIS — Z79899 Other long term (current) drug therapy: Secondary | ICD-10-CM | POA: Diagnosis not present

## 2018-05-23 DIAGNOSIS — I2510 Atherosclerotic heart disease of native coronary artery without angina pectoris: Secondary | ICD-10-CM | POA: Insufficient documentation

## 2018-05-23 DIAGNOSIS — E785 Hyperlipidemia, unspecified: Secondary | ICD-10-CM | POA: Diagnosis not present

## 2018-05-23 DIAGNOSIS — Z951 Presence of aortocoronary bypass graft: Secondary | ICD-10-CM | POA: Diagnosis present

## 2018-05-23 DIAGNOSIS — F329 Major depressive disorder, single episode, unspecified: Secondary | ICD-10-CM | POA: Diagnosis not present

## 2018-05-23 NOTE — Progress Notes (Signed)
Cardiac Individual Treatment Plan  Patient Details  Name: Zachary Ponce MRN: [id number] Date of Birth: 05/07/1951 Referring Provider:   Flowsheet Row CARDIAC REHAB PHASE II ORIENTATION from 05/23/2018 in [facility] CARDIAC REHAB  Referring Provider  Othella Boyer, MD      Initial Encounter Date:  Flowsheet Row CARDIAC REHAB PHASE II ORIENTATION from 05/23/2018 in [facility] CARDIAC REHAB  Date  05/23/18      Visit Diagnosis: 03/08/2018 S/P CABG (coronary artery bypass graft)  Patient's Home Medications on Admission:  Current Outpatient Medications:  .  amiodarone (PACERONE) 200 MG tablet, Take 1 tablet (200 mg total) by mouth 2 (two) times daily. (Patient not taking: Reported on 05/19/2018), Disp: 60 tablet, Rfl: 1 .  apixaban (ELIQUIS) 5 MG TABS tablet, Take 1 tablet (5 mg total) by mouth 2 (two) times daily., Disp: 60 tablet, Rfl: 3 .  aspirin EC 81 MG EC tablet, Take 1 tablet (81 mg total) by mouth daily., Disp: , Rfl:  .  atorvastatin (LIPITOR) 20 MG tablet, Take 1 tablet (20 mg total) by mouth daily at 6 PM., Disp: 30 tablet, Rfl: 3 .  buPROPion (WELLBUTRIN XL) 300 MG 24 hr tablet, Take 300 mg by mouth daily., Disp: , Rfl:  .  escitalopram (LEXAPRO) 5 MG tablet, Take 5 mg by mouth daily., Disp: , Rfl: 0 .  metoprolol tartrate (LOPRESSOR) 25 MG tablet, Take 0.5 tablets (12.5 mg total) by mouth 2 (two) times daily. (Patient not taking: Reported on 05/19/2018), Disp: 30 tablet, Rfl: 3 .  NON FORMULARY, Apply 50 mg topically daily. Testosterone 50mg /ml, Disp: , Rfl: 0 .  propranolol ER (INDERAL LA) 80 MG 24 hr capsule, Take 80 mg by mouth daily., Disp: , Rfl:  .  VYVANSE 30 MG capsule, Take 30 mg by mouth daily., Disp: , Rfl: 0  Past Medical History: Past Medical History:  Diagnosis Date  . ADHD (attention deficit hyperactivity disorder) 03/03/2018  . CAD (coronary artery disease) 03/03/2018   Cardiac cath  03/03/18 70% distal left main, 99%  proximal LAD, 40-50% proximal circumflex, 60% intermediate, 90% ostial right coronary artery, 55% LVEF with some mild distal anterior hypokinesis  . Depression 06/30/2016  . Essential tremor   . History of traumatic head injury   . Hyperlipidemia 03/03/2018    Tobacco Use: Social History   Tobacco Use  Smoking Status Never Smoker  Smokeless Tobacco Never Used    Labs: Recent Review Flowsheet Data    Labs for ITP Cardiac and Pulmonary Rehab Latest Ref Rng & Units 03/08/2018 03/08/2018 03/09/2018 03/10/2018 03/10/2018   Hemoglobin A1c 4.8 - 5.6 % - - - - -   PHART 7.350 - 7.450 7.379 7.307(L) - - -   PCO2ART 32.0 - 48.0 mmHg 38.8 44.7 - - -   HCO3 20.0 - 28.0 mmol/L 23.1 22.6 - - -   TCO2 22 - 32 mmol/L 24 24 22 24 24    ACIDBASEDEF 0.0 - 2.0 mmol/L 2.0 4.0(H) - - -   O2SAT % 99.0 99.0 - - -      Capillary Blood Glucose: Lab Results  Component Value Date   GLUCAP 133 (H) 03/09/2018   GLUCAP 125 (H) 03/09/2018   GLUCAP 114 (H) 03/09/2018   GLUCAP 119 (H) 03/09/2018   GLUCAP 150 (H) 03/08/2018     Exercise Target Goals: Exercise Program Goal: Individual exercise prescription set using results from initial 6 min walk test and THRR while considering  patient's  activity barriers and safety.   Exercise Prescription Goal: Initial exercise prescription builds to 30-45 minutes a day of aerobic activity, 2-3 days per week.  Home exercise guidelines will be given to patient during program as part of exercise prescription that the participant will acknowledge.  Activity Barriers & Risk Stratification: Activity Barriers & Cardiac Risk Stratification - 05/23/18 1428    Activity Barriers & Cardiac Risk Stratification          Activity Barriers  None    Cardiac Risk Stratification  High           6 Minute Walk: 6 Minute Walk    6 Minute Walk    Row Name 05/23/18 1400   Phase  Initial   Distance  1757 feet   Walk Time  6 minutes   # of Rest Breaks  0   MPH  3.33   METS  3.94   RPE   9   Perceived Dyspnea   0   VO2 Peak  13.79   Symptoms  No   Resting HR  68 bpm   Resting BP  102/62   Resting Oxygen Saturation   98 %   Exercise Oxygen Saturation  during 6 min walk  99 %   Max Ex. HR  87 bpm   Max Ex. BP  122/68   2 Minute Post BP  104/72          Oxygen Initial Assessment:   Oxygen Re-Evaluation:   Oxygen Discharge (Final Oxygen Re-Evaluation):   Initial Exercise Prescription: Initial Exercise Prescription - 05/23/18 1400    Date of Initial Exercise RX and Referring Provider          Date  05/23/18    Referring Provider  Othella Boyer, MD        Treadmill          MPH  2.6    Grade  1    Minutes  10    METs  3.35        Bike          Level  1.2    Minutes  10    METs  3.93        NuStep          Level  4    SPM  85    Minutes  10    METs  3        Prescription Details          Frequency (times per week)  3    Duration  Progress to 30 minutes of continuous aerobic without signs/symptoms of physical distress        Intensity          THRR 40-80% of Max Heartrate  61-122    Ratings of Perceived Exertion  11-13    Perceived Dyspnea  0-4        Progression          Progression  Continue to progress workloads to maintain intensity without signs/symptoms of physical distress.        Resistance Training          Training Prescription  Yes    Weight  3lbs    Reps  10-15           Perform Capillary Blood Glucose checks as needed.  Exercise Prescription Changes:   Exercise Comments:   Exercise Goals and Review: Exercise Goals    Exercise Goals  Row Name 05/23/18 1429   Increase Physical Activity  Yes   Intervention  Provide advice, education, support and counseling about physical activity/exercise needs.;Develop an individualized exercise prescription for aerobic and resistive training based on initial evaluation findings, risk stratification, comorbidities and participant's personal goals.   Expected  Outcomes  Short Term: Attend rehab on a regular basis to increase amount of physical activity.;Long Term: Exercising regularly at least 3-5 days a week.;Long Term: Add in home exercise to make exercise part of routine and to increase amount of physical activity.   Increase Strength and Stamina  Yes   Intervention  Provide advice, education, support and counseling about physical activity/exercise needs.;Develop an individualized exercise prescription for aerobic and resistive training based on initial evaluation findings, risk stratification, comorbidities and participant's personal goals.   Expected Outcomes  Short Term: Increase workloads from initial exercise prescription for resistance, speed, and METs.;Short Term: Perform resistance training exercises routinely during rehab and add in resistance training at home;Long Term: Improve cardiorespiratory fitness, muscular endurance and strength as measured by increased METs and functional capacity ( )   Able to understand and use rate of perceived exertion (RPE) scale  Yes   Intervention  Provide education and explanation on how to use RPE scale   Expected Outcomes  Short Term: Able to use RPE daily in rehab to express subjective intensity level;Long Term:  Able to use RPE to guide intensity level when exercising independently   Knowledge and understanding of Target Heart Rate Range (THRR)  Yes   Intervention  Provide education and explanation of THRR including how the numbers were predicted and where they are located for reference   Expected Outcomes  Short Term: Able to state/look up THRR;Long Term: Able to use THRR to govern intensity when exercising independently;Short Term: Able to use daily as guideline for intensity in rehab   Able to check pulse independently  Yes   Intervention  Provide education and demonstration on how to check pulse in carotid and radial arteries.;Review the importance of being able to check your own pulse for safety during  independent exercise   Expected Outcomes  Short Term: Able to explain why pulse checking is important during independent exercise;Long Term: Able to check pulse independently and accurately   Understanding of Exercise Prescription  Yes   Intervention  Provide education, explanation, and written materials on patient's individual exercise prescription   Expected Outcomes  Short Term: Able to explain program exercise prescription;Long Term: Able to explain home exercise prescription to exercise independently          Exercise Goals Re-Evaluation :   Discharge Exercise Prescription (Final Exercise Prescription Changes):   Nutrition:  Target Goals: Understanding of nutrition guidelines, daily intake of sodium 1500mg , cholesterol 200mg , calories 30% from fat and 7% or less from saturated fats, daily to have 5 or more servings of fruits and vegetables.  Biometrics: Pre Biometrics - 05/23/18 1400    Pre Biometrics          Height  5\' 10"  (1.778 m)    Weight  76.2 kg    Waist Circumference  36 inches    Hip Circumference  39.25 inches    Waist to Hip Ratio  0.92 %    BMI (Calculated)  24.1    Triceps Skinfold  11 mm    % Body Fat  22.9 %    Grip Strength  34 kg    Flexibility  8 in    Single Leg Stand  29.06  seconds            Nutrition Therapy Plan and Nutrition Goals:   Nutrition Assessments:   Nutrition Goals Re-Evaluation:   Nutrition Goals Re-Evaluation:   Nutrition Goals Discharge (Final Nutrition Goals Re-Evaluation):   Psychosocial: Target Goals: Acknowledge presence or absence of significant depression and/or stress, maximize coping skills, provide positive support system. Participant is able to verbalize types and ability to use techniques and skills needed for reducing stress and depression.  Initial Review & Psychosocial Screening: Initial Psych Review & Screening - 05/23/18 1409    Initial Review          Current issues with  History of  Depression;Current Anxiety/Panic;Current Stress Concerns    Source of Stress Concerns  Chronic Illness    Comments  h/o stress and anxiety         Family Dynamics          Good Support System?  Yes   spouse, son        Barriers          Psychosocial barriers to participate in program  The patient should benefit from training in stress management and relaxation.        Screening Interventions          Interventions  Encouraged to exercise;To provide support and resources with identified psychosocial needs;Provide feedback about the scores to participant    Expected Outcomes  Short Term goal: Utilizing psychosocial counselor, staff and physician to assist with identification of specific Stressors or current issues interfering with healing process. Setting desired goal for each stressor or current issue identified.;Long Term Goal: Stressors or current issues are controlled or eliminated.;Short Term goal: Identification and review with participant of any Quality of Life or Depression concerns found by scoring the questionnaire.;Long Term goal: The participant improves quality of Life and PHQ9 Scores as seen by post scores and/or verbalization of changes           Quality of Life Scores: Quality of Life - 05/23/18 1418    Quality of Life          Select  Quality of Life        Quality of Life Scores          Health/Function Pre  15.63 %    Socioeconomic Pre  21.86 %    Psych/Spiritual Pre  12.5 %    Family Pre  17.7 %    GLOBAL Pre  16.57 %          Scores of 19 and below usually indicate a poorer quality of life in these areas.  A difference of  2-3 points is a clinically meaningful difference.  A difference of 2-3 points in the total score of the Quality of Life Index has been associated with significant improvement in overall quality of life, self-image, physical symptoms, and general health in studies assessing change in quality of life.  PHQ-9: Recent Review Flowsheet  Data    There is no flowsheet data to display.     Interpretation of Total Score  Total Score Depression Severity:  1-4 = Minimal depression, 5-9 = Mild depression, 10-14 = Moderate depression, 15-19 = Moderately severe depression, 20-27 = Severe depression   Psychosocial Evaluation and Intervention:   Psychosocial Re-Evaluation:   Psychosocial Discharge (Final Psychosocial Re-Evaluation):   Vocational Rehabilitation: Provide vocational rehab assistance to qualifying candidates.   Vocational Rehab Evaluation & Intervention: Vocational Rehab - 05/23/18 1411    Initial Vocational Rehab  Evaluation & Intervention          Assessment shows need for Vocational Rehabilitation  No   retired           Education: Education Goals: Education classes will be provided on a weekly basis, covering required topics. Participant will state understanding/return demonstration of topics presented.  Learning Barriers/Preferences: Learning Barriers/Preferences - 05/23/18 1524    Learning Barriers/Preferences          Learning Barriers  Sight    Learning Preferences  Skilled Demonstration           Education Topics: Count Your Pulse:  -Group instruction provided by verbal instruction, demonstration, patient participation and written materials to support subject.  Instructors address importance of being able to find your pulse and how to count your pulse when at home without a heart monitor.  Patients get hands on experience counting their pulse with staff help and individually.   Heart Attack, Angina, and Risk Factor Modification:  -Group instruction provided by verbal instruction, video, and written materials to support subject.  Instructors address signs and symptoms of angina and heart attacks.    Also discuss risk factors for heart disease and how to make changes to improve heart health risk factors.   Functional Fitness:  -Group instruction provided by verbal instruction,  demonstration, patient participation, and written materials to support subject.  Instructors address safety measures for doing things around the house.  Discuss how to get up and down off the floor, how to pick things up properly, how to safely get out of a chair without assistance, and balance training.   Meditation and Mindfulness:  -Group instruction provided by verbal instruction, patient participation, and written materials to support subject.  Instructor addresses importance of mindfulness and meditation practice to help reduce stress and improve awareness.  Instructor also leads participants through a meditation exercise.    Stretching for Flexibility and Mobility:  -Group instruction provided by verbal instruction, patient participation, and written materials to support subject.  Instructors lead participants through series of stretches that are designed to increase flexibility thus improving mobility.  These stretches are additional exercise for major muscle groups that are typically performed during regular warm up and cool down.   Hands Only CPR:  -Group verbal, video, and participation provides a basic overview of AHA guidelines for community CPR. Role-play of emergencies allow participants the opportunity to practice calling for help and chest compression technique with discussion of AED use.   Hypertension: -Group verbal and written instruction that provides a basic overview of hypertension including the most recent diagnostic guidelines, risk factor reduction with self-care instructions and medication management.    Nutrition I class: Heart Healthy Eating:  -Group instruction provided by PowerPoint slides, verbal discussion, and written materials to support subject matter. The instructor gives an explanation and review of the Therapeutic Lifestyle Changes diet recommendations, which includes a discussion on lipid goals, dietary fat, sodium, fiber, plant stanol/sterol esters,  sugar, and the components of a well-balanced, healthy diet.   Nutrition II class: Lifestyle Skills:  -Group instruction provided by PowerPoint slides, verbal discussion, and written materials to support subject matter. The instructor gives an explanation and review of label reading, grocery shopping for heart health, heart healthy recipe modifications, and ways to make healthier choices when eating out.   Diabetes Question & Answer:  -Group instruction provided by PowerPoint slides, verbal discussion, and written materials to support subject matter. The instructor gives an explanation and review of diabetes co-morbidities,  pre- and post-prandial blood glucose goals, pre-exercise blood glucose goals, signs, symptoms, and treatment of hypoglycemia and hyperglycemia, and foot care basics.   Diabetes Blitz:  -Group instruction provided by PowerPoint slides, verbal discussion, and written materials to support subject matter. The instructor gives an explanation and review of the physiology behind type 1 and type 2 diabetes, diabetes medications and rational behind using different medications, pre- and post-prandial blood glucose recommendations and Hemoglobin A1c goals, diabetes diet, and exercise including blood glucose guidelines for exercising safely.    Portion Distortion:  -Group instruction provided by PowerPoint slides, verbal discussion, written materials, and food models to support subject matter. The instructor gives an explanation of serving size versus portion size, changes in portions sizes over the last 20 years, and what consists of a serving from each food group.   Stress Management:  -Group instruction provided by verbal instruction, video, and written materials to support subject matter.  Instructors review role of stress in heart disease and how to cope with stress positively.     Exercising on Your Own:  -Group instruction provided by verbal instruction, power point, and written  materials to support subject.  Instructors discuss benefits of exercise, components of exercise, frequency and intensity of exercise, and end points for exercise.  Also discuss use of nitroglycerin and activating EMS.  Review options of places to exercise outside of rehab.  Review guidelines for sex with heart disease.   Cardiac Drugs I:  -Group instruction provided by verbal instruction and written materials to support subject.  Instructor reviews cardiac drug classes: antiplatelets, anticoagulants, beta blockers, and statins.  Instructor discusses reasons, side effects, and lifestyle considerations for each drug class.   Cardiac Drugs II:  -Group instruction provided by verbal instruction and written materials to support subject.  Instructor reviews cardiac drug classes: angiotensin converting enzyme inhibitors (ACE-I), angiotensin II receptor blockers (ARBs), nitrates, and calcium channel blockers.  Instructor discusses reasons, side effects, and lifestyle considerations for each drug class.   Anatomy and Physiology of the Circulatory System:  Group verbal and written instruction and models provide basic cardiac anatomy and physiology, with the coronary electrical and arterial systems. Review of: AMI, Angina, Valve disease, Heart Failure, Peripheral Artery Disease, Cardiac Arrhythmia, Pacemakers, and the ICD.   Other Education:  -Group or individual verbal, written, or video instructions that support the educational goals of the cardiac rehab program.   Holiday Eating Survival Tips:  -Group instruction provided by PowerPoint slides, verbal discussion, and written materials to support subject matter. The instructor gives patients tips, tricks, and techniques to help them not only survive but enjoy the holidays despite the onslaught of food that accompanies the holidays.   Knowledge Questionnaire Score: Knowledge Questionnaire Score - 05/23/18 1409    Knowledge Questionnaire Score           Pre Score  22/24           Core Components/Risk Factors/Patient Goals at Admission: Personal Goals and Risk Factors at Admission - 05/23/18 1419    Core Components/Risk Factors/Patient Goals on Admission           Weight Management  Weight Maintenance    Lipids  Yes    Intervention  Provide education and support for participant on nutrition & aerobic/resistive exercise along with prescribed medications to achieve LDL 70mg , HDL >40mg .    Expected Outcomes  Short Term: Participant states understanding of desired cholesterol values and is compliant with medications prescribed. Participant is following exercise prescription and  nutrition guidelines.;Long Term: Cholesterol controlled with medications as prescribed, with individualized exercise RX and with personalized nutrition plan. Value goals: LDL < 70mg , HDL > 40 mg.    Stress  Yes    Intervention  Offer individual and/or small group education and counseling on adjustment to heart disease, stress management and health-related lifestyle change. Teach and support self-help strategies.;Refer participants experiencing significant psychosocial distress to appropriate mental health specialists for further evaluation and treatment. When possible, include family members and significant others in education/counseling sessions.    Expected Outcomes  Short Term: Participant demonstrates changes in health-related behavior, relaxation and other stress management skills, ability to obtain effective social support, and compliance with psychotropic medications if prescribed.;Long Term: Emotional wellbeing is indicated by absence of clinically significant psychosocial distress or social isolation.           Core Components/Risk Factors/Patient Goals Review:    Core Components/Risk Factors/Patient Goals at Discharge (Final Review):    ITP Comments: ITP Comments    Row Name 05/22/18 1700 05/23/18 1409   ITP Comments  Dr. Armanda Magicraci Turner, Medical Director    Dr. Armanda Magicraci Turner, Medical Director       Comments: Patient attended orientation from 1357 to 1508  to review rules and guidelines for program. Completed 6 minute walk test, Intitial ITP, and exercise prescription.  VSS. Telemetry-sinus rhythm, .  Asymptomatic.Deveron FurlongJoann Rion, RN, BSN Cardiac Pulmonary Rehab

## 2018-05-24 NOTE — Progress Notes (Addendum)
Zachary Ponce 67 y.o. male DOB: [birth year] MRN: [id number]      Nutrition Note  1. 03/08/2018 S/P CABG (coronary artery bypass graft)    Past Medical History:  Diagnosis Date  . ADHD (attention deficit hyperactivity disorder) 03/03/2018  . CAD (coronary artery disease) 03/03/2018   Cardiac cath  03/03/18 70% distal left main, 99% proximal LAD, 40-50% proximal circumflex, 60% intermediate, 90% ostial right coronary artery, 55% LVEF with some mild distal anterior hypokinesis  . Depression 06/30/2016  . Essential tremor   . History of traumatic head injury   . Hyperlipidemia 03/03/2018   Meds reviewed.    Current Outpatient Medications (Cardiovascular):  .  amiodarone (PACERONE) 200 MG tablet, Take 1 tablet (200 mg total) by mouth 2 (two) times daily. (Patient not taking: Reported on 05/19/2018) .  atorvastatin (LIPITOR) 20 MG tablet, Take 1 tablet (20 mg total) by mouth daily at 6 PM. .  metoprolol tartrate (LOPRESSOR) 25 MG tablet, Take 0.5 tablets (12.5 mg total) by mouth 2 (two) times daily. (Patient not taking: Reported on 05/19/2018) .  propranolol ER (INDERAL LA) 80 MG 24 hr capsule, Take 80 mg by mouth daily.   Current Outpatient Medications (Analgesics):  .  aspirin EC 81 MG EC tablet, Take 1 tablet (81 mg total) by mouth daily.  Current Outpatient Medications (Hematological):  .  apixaban (ELIQUIS) 5 MG TABS tablet, Take 1 tablet (5 mg total) by mouth 2 (two) times daily.  Current Outpatient Medications (Other):  [name]  buPROPion (WELLBUTRIN XL) 300 MG 24 hr tablet, Take 300 mg by mouth daily. [name]  escitalopram (LEXAPRO) 5 MG tablet, Take 5 mg by mouth daily. .  NON FORMULARY, Apply 50 mg topically daily. Testosterone 50mg /ml .  VYVANSE 30 MG capsule, Take 30 mg by mouth daily.   HT: Ht Readings from Last 1 Encounters:  05/23/18 5\' 10"  (1.778 m)    WT: Wt Readings from Last 5 Encounters:  05/23/18 167 lb 15.9 oz (76.2 kg)  04/11/18 164 lb (74.4 kg)  04/06/18 168 lb (76.2  kg)  03/16/18 169 lb 12.1 oz (77 kg)     Body mass index is 24.1 kg/m.   Current tobacco use? No  Labs:  Lipid Panel  No results found for: CHOL, TRIG, HDL, CHOLHDL, VLDL, LDLCALC, LDLDIRECT  Lab Results  Component Value Date   HGBA1C 5.5 03/08/2018   CBG (last 3)  No results for input(s): GLUCAP in the last 72 hours.  Nutrition Note Spoke with pt. Nutrition plan and goals reviewed with pt. Pt is not following a heart healthy diet. Heart healthy eating tips reviewed (label reading, how to build a healthy plate, portion sizes, reducing saturated/trans fats, reducing sodium). Last A1c indicates blood glucose normal but elevated at 5.5, recommended pt consume more complex carbohydrates over refined carbohydrates to help managed blood glucose levels. Per discussion, pt does not use canned/convenience foods often. Pt rarely adds salt to food. Pt eats out frequently, ~4-5x a week. Recommended pt eat more meals at home to help control saturated fat and trans fat levels, as well as sodium. Pt expressed understanding of the information reviewed. Pt aware of nutrition education classes offered and plans on attending nutrition classes.  Nutrition Diagnosis ? Food-and nutrition-related knowledge deficit related to lack of exposure to information as related to diagnosis of: ? CVD  Nutrition Intervention ? Pt's individual nutrition plan and goals reviewed with pt.  Nutrition Goal(s):   ? Pt to identify and limit food sources  of saturated fat, trans fat, refined carbohydrates and sodium ? Pt to cut down on beef or pork high in saturated fat. ? Pt to eat more lean protein (fish, chicken, [place]). ? Pt able to name foods that affect blood glucose.   Plan:  ? Pt to attend nutrition classes ? Nutrition I ? Nutrition II ? Portion Distortion  ? Diabetes Blitz ? Diabetes Q & Ae determined ? Will provide client-centered nutrition education as part of interdisciplinary care ? Monitor and evaluate  progress toward nutrition goal with team.   Ross MarcusAubrey Burklin, MS, RD, LDN 05/24/2018 8:02 AM

## 2018-05-29 ENCOUNTER — Encounter (HOSPITAL_COMMUNITY)
Admission: RE | Admit: 2018-05-29 | Discharge: 2018-05-29 | Disposition: A | Discharge status: Home / Self Care | Payer: Medicare Other | Source: Ambulatory Visit | Attending: Pulmonary Disease | Admitting: Pulmonary Disease

## 2018-05-29 ENCOUNTER — Encounter (HOSPITAL_COMMUNITY): Payer: Self-pay

## 2018-05-29 ENCOUNTER — Encounter (HOSPITAL_COMMUNITY): Payer: Medicare Other

## 2018-05-29 DIAGNOSIS — Z951 Presence of aortocoronary bypass graft: Secondary | ICD-10-CM

## 2018-05-29 DIAGNOSIS — I2510 Atherosclerotic heart disease of native coronary artery without angina pectoris: Secondary | ICD-10-CM | POA: Diagnosis not present

## 2018-05-29 NOTE — Progress Notes (Signed)
Daily Session Note  Patient Details  Name: Zachary Ponce MRN: [id number] Date of Birth: 11-07-1950 Referring Provider:     CARDIAC REHAB PHASE II ORIENTATION from 05/23/2018 in [place]  Referring Provider  Jacolyn Reedy, MD      Encounter Date: 05/29/2018  Check In: Session Check In - 05/29/18 1129      Check-In   Supervising physician immediately available to respond to emergencies  [facility] immediately available    Physician(s)  Dr. Loleta Books    Location  [facility]    Staff Present  Seward Carol, MS, ACSM CEP, Exercise Physiologist;Maria Whitaker, RN, BSN;Molly DiVincenzo, MS, ACSM RCEP, Exercise Physiologist;Tyara Carol Ada, MS,ACSM CEP, Exercise Physiologist;Faithe Ariola Karle Starch, RN, BSN    Medication changes reported      No    Fall or balance concerns reported     No    Tobacco Cessation  No Change    Warm-up and Cool-down  Performed as group-led instruction    Resistance Training Performed  Yes    VAD Patient?  No    PAD/SET Patient?  No      Pain Assessment   Currently in Pain?  No/denies    Multiple Pain Sites  No       Capillary Blood Glucose: No results found for this or any previous visit (from the past 24 hour(s)).  Exercise Prescription Changes - 05/29/18 1600      Response to Exercise   Blood Pressure (Admit)  102/70    Blood Pressure (Exercise)  146/70    Blood Pressure (Exit)  104/62    Heart Rate (Admit)  63 bpm    Heart Rate (Exercise)  91 bpm    Heart Rate (Exit)  61 bpm    Rating of Perceived Exertion (Exercise)  12    Perceived Dyspnea (Exercise)  0    Symptoms  None    Comments  Pt's first day of exercise     Duration  Progress to 30 minutes of  aerobic without signs/symptoms of physical distress    Intensity  THRR unchanged      Progression   Progression  Continue to progress workloads to maintain intensity without signs/symptoms of physical distress.    Average METs  3.65      Resistance Training   Training Prescription  Yes    Weight  3lbs    Reps  10-15    Time  10 Minutes      Interval Training   Interval Training  No      Treadmill   MPH  2.6    Grade  1    Minutes  10    METs  3.35      Bike   Level  1.2    Minutes  10    METs  4      NuStep   Level  4    SPM  95    Minutes  10    METs  3.6       Social History   Tobacco Use  Smoking Status Never Smoker  Smokeless Tobacco Never Used    Goals Met:  Exercise tolerated well  Goals Unmet:  Not Applicable  Comments: Pt started cardiac rehab today.  Pt tolerated light exercise without difficulty. VSS, telemetry-SR, asymptomatic.  Medication list reconciled. Pt denies barriers to medicaiton compliance.  PSYCHOSOCIAL ASSESSMENT:  PHQ-0. Pt exhibits positive coping skills, hopeful outlook with supportive family. No psychosocial  needs identified at this time, no psychosocial interventions necessary.   Pt oriented to exercise equipment and routine.    Understanding verbalized.    Dr. Fransico Him is Medical Director for Cardiac Rehab at [facility].

## 2018-05-31 ENCOUNTER — Encounter (HOSPITAL_COMMUNITY)
Admission: RE | Admit: 2018-05-31 | Discharge: 2018-05-31 | Disposition: A | Discharge status: Home / Self Care | Payer: Medicare Other | Source: Ambulatory Visit | Attending: Pulmonary Disease | Admitting: Pulmonary Disease

## 2018-05-31 ENCOUNTER — Encounter (HOSPITAL_COMMUNITY): Payer: Medicare Other

## 2018-05-31 DIAGNOSIS — Z951 Presence of aortocoronary bypass graft: Secondary | ICD-10-CM

## 2018-05-31 DIAGNOSIS — I2510 Atherosclerotic heart disease of native coronary artery without angina pectoris: Secondary | ICD-10-CM | POA: Diagnosis not present

## 2018-05-31 NOTE — Progress Notes (Signed)
Zachary Ponce 67 y.o. male Nutrition Note Spoke with pt. Nutrition plan and goals reviewed with pt. Pt is not following a heart healthy diet. Heart healthy eating tips reviewed (label reading, how to build a healthy plate, portion sizes, reducing saturated/trans fats, reducing sodium). Distributed handouts on label reading, and how to build a healthy plate to patient. Discussed with patient the importance of setting small attainable goals, as he expressed he has "so much to learn". Last A1c indicates blood glucose normal but elevated at 5.5, recommended pt consume more complex carbohydrates over refined carbohydrates to help managed blood glucose levels. Educated patient on difference between complex and refined carbohydrates, and reinforced with discussion of food groups and revisited label reading. Per discussion, pt does not use canned/convenience foods often. Pt rarely adds salt to food. Pt eats out frequently, ~4-5x a week. Recommended pt eat more meals at home to help control saturated fat and trans fat levels, as well as sodium. Pt expressed understanding of the information reviewed. Pt aware of nutrition education classes offered and plans on attending nutrition classes.  Lab Results  Component Value Date   HGBA1C 5.5 03/08/2018    Wt Readings from Last 3 Encounters:  05/23/18 167 lb 15.9 oz (76.2 kg)  04/11/18 164 lb (74.4 kg)  04/06/18 168 lb (76.2 kg)    Nutrition Diagnosis ? Food-and nutrition-related knowledge deficit related to lack of exposure to information as related to diagnosis of: ? CVD   Nutrition Intervention ? Pt's individual nutrition plan reviewed with pt. ? Benefits of adopting Heart Healthy diet discussed when Medficts reviewed.    Goal(s)  Pt to identify and limit food sources of saturated fat, trans fat, refined carbohydrates and sodium  Pt to cut down on beef or pork high in saturated fat.  Pt to eat more lean protein (fish, chicken, [place]).  Pt able to  name foods that affect blood glucose..  Plan:   Pt to attend nutrition classes ? Nutrition I ? Nutrition II ? Portion Distortion   Will provide client-centered nutrition education as part of interdisciplinary care  Monitor and evaluate progress toward nutrition goal with team.    Ross MarcusAubrey Burklin, MS, RD, LDN 05/31/2018 12:11 PM

## 2018-06-01 NOTE — Progress Notes (Signed)
QUALITY OF LIFE SCORE REVIEW  Pt completed Quality of Life survey as a participant in Cardiac Rehab. Scores 21.0 or below are considered low. Pt score very low in several areas Overall 16.57, Health and Function 15.63, socioeconomic 21.86, physiological and spiritual 12.5, family 17.7. Patient quality of life slightly altered by physical constraints which limits ability to perform as prior to recent cardiac illness.  He struggles with not being able to participate in his hobbies, golfing and traveling, as he was prior to his open heart surgery.  Offered emotional support and reassurance.  Will continue to monitor and intervene as necessary.

## 2018-06-02 ENCOUNTER — Encounter (HOSPITAL_COMMUNITY)
Admission: RE | Admit: 2018-06-02 | Discharge: 2018-06-02 | Disposition: A | Discharge status: Home / Self Care | Payer: Medicare Other | Source: Ambulatory Visit | Attending: Pulmonary Disease | Admitting: Pulmonary Disease

## 2018-06-02 ENCOUNTER — Encounter (HOSPITAL_COMMUNITY): Payer: Medicare Other

## 2018-06-02 DIAGNOSIS — Z951 Presence of aortocoronary bypass graft: Secondary | ICD-10-CM

## 2018-06-02 DIAGNOSIS — I2510 Atherosclerotic heart disease of native coronary artery without angina pectoris: Secondary | ICD-10-CM | POA: Diagnosis not present

## 2018-06-05 ENCOUNTER — Encounter (HOSPITAL_COMMUNITY): Payer: Medicare Other

## 2018-06-05 ENCOUNTER — Encounter (HOSPITAL_COMMUNITY)
Admission: RE | Admit: 2018-06-05 | Discharge: 2018-06-05 | Disposition: A | Discharge status: Home / Self Care | Payer: Medicare Other | Source: Ambulatory Visit | Attending: Pulmonary Disease | Admitting: Pulmonary Disease

## 2018-06-05 DIAGNOSIS — I2510 Atherosclerotic heart disease of native coronary artery without angina pectoris: Secondary | ICD-10-CM | POA: Diagnosis not present

## 2018-06-05 DIAGNOSIS — Z951 Presence of aortocoronary bypass graft: Secondary | ICD-10-CM

## 2018-06-07 ENCOUNTER — Encounter (HOSPITAL_COMMUNITY): Payer: Medicare Other

## 2018-06-07 ENCOUNTER — Encounter (HOSPITAL_COMMUNITY)
Admission: RE | Admit: 2018-06-07 | Discharge: 2018-06-07 | Disposition: A | Discharge status: Home / Self Care | Payer: Medicare Other | Source: Ambulatory Visit | Attending: Pulmonary Disease | Admitting: Pulmonary Disease

## 2018-06-07 DIAGNOSIS — F909 Attention-deficit hyperactivity disorder, unspecified type: Secondary | ICD-10-CM | POA: Diagnosis not present

## 2018-06-07 DIAGNOSIS — E785 Hyperlipidemia, unspecified: Secondary | ICD-10-CM | POA: Insufficient documentation

## 2018-06-07 DIAGNOSIS — Z7901 Long term (current) use of anticoagulants: Secondary | ICD-10-CM | POA: Diagnosis not present

## 2018-06-07 DIAGNOSIS — I2510 Atherosclerotic heart disease of native coronary artery without angina pectoris: Secondary | ICD-10-CM | POA: Insufficient documentation

## 2018-06-07 DIAGNOSIS — F329 Major depressive disorder, single episode, unspecified: Secondary | ICD-10-CM | POA: Insufficient documentation

## 2018-06-07 DIAGNOSIS — Z79899 Other long term (current) drug therapy: Secondary | ICD-10-CM | POA: Diagnosis not present

## 2018-06-07 DIAGNOSIS — Z951 Presence of aortocoronary bypass graft: Secondary | ICD-10-CM | POA: Diagnosis present

## 2018-06-08 ENCOUNTER — Encounter (HOSPITAL_COMMUNITY): Payer: Self-pay

## 2018-06-08 NOTE — Progress Notes (Signed)
Cardiac Individual Treatment Plan  Patient Details  Name: Zachary Ponce MRN: [id number] Date of Birth: [birth year] Referring Provider:     CARDIAC REHAB PHASE II ORIENTATION from 05/23/2018 in [facility] CARDIAC REHAB  Referring Provider  Othella Boyer, MD      Initial Encounter Date:    CARDIAC REHAB PHASE II ORIENTATION from 05/23/2018 in [facility] CARDIAC REHAB  Date  05/23/18      Visit Diagnosis: 03/08/2018 S/P CABG (coronary artery bypass graft)  Patient's Home Medications on Admission:  Current Outpatient Medications:  .  amiodarone (PACERONE) 200 MG tablet, Take 1 tablet (200 mg total) by mouth 2 (two) times daily. (Patient not taking: Reported on 05/19/2018), Disp: 60 tablet, Rfl: 1 .  apixaban (ELIQUIS) 5 MG TABS tablet, Take 1 tablet (5 mg total) by mouth 2 (two) times daily., Disp: 60 tablet, Rfl: 3 .  aspirin EC 81 MG EC tablet, Take 1 tablet (81 mg total) by mouth daily., Disp: , Rfl:  .  atorvastatin (LIPITOR) 20 MG tablet, Take 1 tablet (20 mg total) by mouth daily at 6 PM., Disp: 30 tablet, Rfl: 3 .  buPROPion (WELLBUTRIN XL) 300 MG 24 hr tablet, Take 300 mg by mouth daily., Disp: , Rfl:  .  escitalopram (LEXAPRO) 5 MG tablet, Take 5 mg by mouth daily., Disp: , Rfl: 0 .  metoprolol tartrate (LOPRESSOR) 25 MG tablet, Take 0.5 tablets (12.5 mg total) by mouth 2 (two) times daily. (Patient not taking: Reported on 05/19/2018), Disp: 30 tablet, Rfl: 3 .  NON FORMULARY, Apply 50 mg topically daily. Testosterone 50mg /ml, Disp: , Rfl: 0 .  propranolol ER (INDERAL LA) 80 MG 24 hr capsule, Take 80 mg by mouth daily., Disp: , Rfl:  .  VYVANSE 30 MG capsule, Take 30 mg by mouth daily., Disp: , Rfl: 0  Past Medical History: Past Medical History:  Diagnosis Date  . ADHD (attention deficit hyperactivity disorder) 03/03/2018  . CAD (coronary artery disease) 03/03/2018   Cardiac cath  03/03/18 70% distal left main, 99% proximal LAD, 40-50%  proximal circumflex, 60% intermediate, 90% ostial right coronary artery, 55% LVEF with some mild distal anterior hypokinesis  . Depression 06/30/2016  . Essential tremor   . History of traumatic head injury   . Hyperlipidemia 03/03/2018    Tobacco Use: Social History   Tobacco Use  Smoking Status Never Smoker  Smokeless Tobacco Never Used    Labs: Recent Review Flowsheet Data    Labs for ITP Cardiac and Pulmonary Rehab Latest Ref Rng & Units 03/08/2018 03/08/2018 03/09/2018 03/10/2018 03/10/2018   Hemoglobin A1c 4.8 - 5.6 % - - - - -   PHART 7.350 - 7.450 7.379 7.307(L) - - -   PCO2ART 32.0 - 48.0 mmHg 38.8 44.7 - - -   HCO3 20.0 - 28.0 mmol/L 23.1 22.6 - - -   TCO2 22 - 32 mmol/L 24 24 22 24 24    ACIDBASEDEF 0.0 - 2.0 mmol/L 2.0 4.0(H) - - -   O2SAT % 99.0 99.0 - - -      Capillary Blood Glucose: Lab Results  Component Value Date   GLUCAP 133 (H) 03/09/2018   GLUCAP 125 (H) 03/09/2018   GLUCAP 114 (H) 03/09/2018   GLUCAP 119 (H) 03/09/2018   GLUCAP 150 (H) 03/08/2018     Exercise Target Goals: Exercise Program Goal: Individual exercise prescription set using results from initial 6 min walk test and THRR while considering  patient's  activity barriers and safety.   Exercise Prescription Goal: Initial exercise prescription builds to 30-45 minutes a day of aerobic activity, 2-3 days per week.  Home exercise guidelines will be given to patient during program as part of exercise prescription that the participant will acknowledge.  Activity Barriers & Risk Stratification: Activity Barriers & Cardiac Risk Stratification - 05/23/18 1428      Activity Barriers & Cardiac Risk Stratification   Activity Barriers  None    Cardiac Risk Stratification  High       6 Minute Walk: 6 Minute Walk    Row Name 05/23/18 1400         6 Minute Walk   Phase  Initial     Distance  1757 feet     Walk Time  6 minutes     # of Rest Breaks  0     MPH  3.33     METS  3.94     RPE  9      Perceived Dyspnea   0     VO2 Peak  13.79     Symptoms  No     Resting HR  68 bpm     Resting BP  102/62     Resting Oxygen Saturation   98 %     Exercise Oxygen Saturation  during 6 min walk  99 %     Max Ex. HR  87 bpm     Max Ex. BP  122/68     2 Minute Post BP  104/72        Oxygen Initial Assessment:   Oxygen Re-Evaluation:   Oxygen Discharge (Final Oxygen Re-Evaluation):   Initial Exercise Prescription: Initial Exercise Prescription - 05/23/18 1400      Date of Initial Exercise RX and Referring Provider   Date  05/23/18    Referring Provider  Othella Boyer, MD      Treadmill   MPH  2.6    Grade  1    Minutes  10    METs  3.35      Bike   Level  1.2    Minutes  10    METs  3.93      NuStep   Level  4    SPM  85    Minutes  10    METs  3      Prescription Details   Frequency (times per week)  3    Duration  Progress to 30 minutes of continuous aerobic without signs/symptoms of physical distress      Intensity   THRR 40-80% of Max Heartrate  61-122    Ratings of Perceived Exertion  11-13    Perceived Dyspnea  0-4      Progression   Progression  Continue to progress workloads to maintain intensity without signs/symptoms of physical distress.      Resistance Training   Training Prescription  Yes    Weight  3lbs    Reps  10-15       Perform Capillary Blood Glucose checks as needed.  Exercise Prescription Changes: Exercise Prescription Changes    Row Name 05/29/18 1600 06/07/18 0733           Response to Exercise   Blood Pressure (Admit)  102/70  116/62      Blood Pressure (Exercise)  146/70  118/74      Blood Pressure (Exit)  104/62  104/60      Heart Rate (Admit)  63 bpm  69 bpm      Heart Rate (Exercise)  91 bpm  116 bpm      Heart Rate (Exit)  61 bpm  79 bpm      Rating of Perceived Exertion (Exercise)  12  12      Perceived Dyspnea (Exercise)  0  0      Symptoms  None  None      Comments  Pt's first day of exercise   None       Duration  Progress to 30 minutes of  aerobic without signs/symptoms of physical distress  Progress to 30 minutes of  aerobic without signs/symptoms of physical distress      Intensity  THRR unchanged  THRR unchanged        Progression   Progression  Continue to progress workloads to maintain intensity without signs/symptoms of physical distress.  Continue to progress workloads to maintain intensity without signs/symptoms of physical distress.      Average METs  3.65  4.12        Resistance Training   Training Prescription  Yes  No      Weight  3lbs  -      Reps  10-15  -      Time  10 Minutes  -        Interval Training   Interval Training  No  No        Treadmill   MPH  2.6  3.4      Grade  1  2      Minutes  10  10      METs  3.35  4.54        Bike   Level  1.2  1.4      Minutes  10  10      METs  4  4.51        NuStep   Level  4  4      SPM  95  95      Minutes  10  10      METs  3.6  3.3         Exercise Comments: Exercise Comments    Row Name 05/29/18 1632           Exercise Comments  Today, was pt's first day of exercise. Pt responded well to exercise workloads. Will continue to monitor and progress pt as tolerated.           Exercise Goals and Review: Exercise Goals    Row Name 05/23/18 1429             Exercise Goals   Increase Physical Activity  Yes       Intervention  Provide advice, education, support and counseling about physical activity/exercise needs.;Develop an individualized exercise prescription for aerobic and resistive training based on initial evaluation findings, risk stratification, comorbidities and participant's personal goals.       Expected Outcomes  Short Term: Attend rehab on a regular basis to increase amount of physical activity.;Long Term: Exercising regularly at least 3-5 days a week.;Long Term: Add in home exercise to make exercise part of routine and to increase amount of physical activity.       Increase Strength and  Stamina  Yes       Intervention  Provide advice, education, support and counseling about physical activity/exercise needs.;Develop an individualized exercise prescription for aerobic and resistive training based on initial evaluation findings, risk  stratification, comorbidities and participant's personal goals.       Expected Outcomes  Short Term: Increase workloads from initial exercise prescription for resistance, speed, and METs.;Short Term: Perform resistance training exercises routinely during rehab and add in resistance training at home;Long Term: Improve cardiorespiratory fitness, muscular endurance and strength as measured by increased METs and functional capacity ( )       Able to understand and use rate of perceived exertion (RPE) scale  Yes       Intervention  Provide education and explanation on how to use RPE scale       Expected Outcomes  Short Term: Able to use RPE daily in rehab to express subjective intensity level;Long Term:  Able to use RPE to guide intensity level when exercising independently       Knowledge and understanding of Target Heart Rate Range (THRR)  Yes       Intervention  Provide education and explanation of THRR including how the numbers were predicted and where they are located for reference       Expected Outcomes  Short Term: Able to state/look up THRR;Long Term: Able to use THRR to govern intensity when exercising independently;Short Term: Able to use daily as guideline for intensity in rehab       Able to check pulse independently  Yes       Intervention  Provide education and demonstration on how to check pulse in carotid and radial arteries.;Review the importance of being able to check your own pulse for safety during independent exercise       Expected Outcomes  Short Term: Able to explain why pulse checking is important during independent exercise;Long Term: Able to check pulse independently and accurately       Understanding of Exercise Prescription  Yes        Intervention  Provide education, explanation, and written materials on patient's individual exercise prescription       Expected Outcomes  Short Term: Able to explain program exercise prescription;Long Term: Able to explain home exercise prescription to exercise independently          Exercise Goals Re-Evaluation :   Discharge Exercise Prescription (Final Exercise Prescription Changes): Exercise Prescription Changes - 06/07/18 0733      Response to Exercise   Blood Pressure (Admit)  116/62    Blood Pressure (Exercise)  118/74    Blood Pressure (Exit)  104/60    Heart Rate (Admit)  69 bpm    Heart Rate (Exercise)  116 bpm    Heart Rate (Exit)  79 bpm    Rating of Perceived Exertion (Exercise)  12    Perceived Dyspnea (Exercise)  0    Symptoms  None    Comments  None    Duration  Progress to 30 minutes of  aerobic without signs/symptoms of physical distress    Intensity  THRR unchanged      Progression   Progression  Continue to progress workloads to maintain intensity without signs/symptoms of physical distress.    Average METs  4.12      Resistance Training   Training Prescription  No      Interval Training   Interval Training  No      Treadmill   MPH  3.4    Grade  2    Minutes  10    METs  4.54      Bike   Level  1.4    Minutes  10    METs  4.51      NuStep   Level  4    SPM  95    Minutes  10    METs  3.3       Nutrition:  Target Goals: Understanding of nutrition guidelines, daily intake of sodium 1500mg , cholesterol 200mg , calories 30% from fat and 7% or less from saturated fats, daily to have 5 or more servings of fruits and vegetables.  Biometrics: Pre Biometrics - 05/23/18 1400      Pre Biometrics   Height  5\' 10"  (1.778 m)    Weight  76.2 kg    Waist Circumference  36 inches    Hip Circumference  39.25 inches    Waist to Hip Ratio  0.92 %    BMI (Calculated)  24.1    Triceps Skinfold  11 mm    % Body Fat  22.9 %    Grip Strength  34 kg     Flexibility  8 in    Single Leg Stand  29.06 seconds        Nutrition Therapy Plan and Nutrition Goals: Nutrition Therapy & Goals - 05/24/18 0802      Nutrition Therapy   Diet  heart healthy      Personal Nutrition Goals   Nutrition Goal  Pt to identify and limit food sources of saturated fat, trans fat, refined carbohydrates and sodium    Personal Goal #2  Pt to cut down on beef or pork high in saturated fat.    Personal Goal #3  Pt to eat more lean protein (fish, chicken, [place])    Personal Goal #4  Pt able to name foods that affect blood glucose.      Intervention Plan   Intervention  Prescribe, educate and counsel regarding individualized specific dietary modifications aiming towards targeted core components such as weight, hypertension, lipid management, diabetes, heart failure and other comorbidities.    Expected Outcomes  Short Term Goal: Understand basic principles of dietary content, such as calories, fat, sodium, cholesterol and nutrients.;Long Term Goal: Adherence to prescribed nutrition plan.       Nutrition Assessments: Nutrition Assessments - 05/24/18 0831      MEDFICTS Scores   Pre Score  74       Nutrition Goals Re-Evaluation:   Nutrition Goals Re-Evaluation:   Nutrition Goals Discharge (Final Nutrition Goals Re-Evaluation):   Psychosocial: Target Goals: Acknowledge presence or absence of significant depression and/or stress, maximize coping skills, provide positive support system. Participant is able to verbalize types and ability to use techniques and skills needed for reducing stress and depression.  Initial Review & Psychosocial Screening: Initial Psych Review & Screening - 05/23/18 1409      Initial Review   Current issues with  History of Depression;Current Anxiety/Panic;Current Stress Concerns    Source of Stress Concerns  Chronic Illness    Comments  h/o stress and anxiety       Family Dynamics   Good Support System?  Yes   spouse,  son      Barriers   Psychosocial barriers to participate in program  The patient should benefit from training in stress management and relaxation.      Screening Interventions   Interventions  Encouraged to exercise;To provide support and resources with identified psychosocial needs;Provide feedback about the scores to participant    Expected Outcomes  Short Term goal: Utilizing psychosocial counselor, staff and physician to assist with identification of specific Stressors or current issues interfering with healing  process. Setting desired goal for each stressor or current issue identified.;Long Term Goal: Stressors or current issues are controlled or eliminated.;Short Term goal: Identification and review with participant of any Quality of Life or Depression concerns found by scoring the questionnaire.;Long Term goal: The participant improves quality of Life and PHQ9 Scores as seen by post scores and/or verbalization of changes       Quality of Life Scores: Quality of Life - 05/23/18 1418      Quality of Life   Select  Quality of Life      Quality of Life Scores   Health/Function Pre  15.63 %    Socioeconomic Pre  21.86 %    Psych/Spiritual Pre  12.5 %    Family Pre  17.7 %    GLOBAL Pre  16.57 %      Scores of 19 and below usually indicate a poorer quality of life in these areas.  A difference of  2-3 points is a clinically meaningful difference.  A difference of 2-3 points in the total score of the Quality of Life Index has been associated with significant improvement in overall quality of life, self-image, physical symptoms, and general health in studies assessing change in quality of life.  PHQ-9: Recent Review Flowsheet Data    Depression screen [facility] 2/9 05/29/2018   Decreased Interest 0   Down, Depressed, Hopeless 0   PHQ - 2 Score 0     Interpretation of Total Score  Total Score Depression Severity:  1-4 = Minimal depression, 5-9 = Mild depression, 10-14 = Moderate  depression, 15-19 = Moderately severe depression, 20-27 = Severe depression   Psychosocial Evaluation and Intervention: Psychosocial Evaluation - 05/29/18 1705      Psychosocial Evaluation & Interventions   Interventions  Encouraged to exercise with the program and follow exercise prescription;Stress management education;Relaxation education    Comments  Pt with history of depression, stress, and anxiety.  No acute concerns.    Expected Outcomes  Zachary Ponce will report continued management of his depression, stress, and anxiety.  He will report the use of good coping skills.    Continue Psychosocial Services   Follow up required by staff       Psychosocial Re-Evaluation: Psychosocial Re-Evaluation    Row Name 06/08/18 1517             Psychosocial Re-Evaluation   Current issues with  Current Anxiety/Panic;Current Stress Concerns       Comments  Elden previously struggled with not being able to golf or travel like he was once able to.  He has booked a trip and is seeing his ability with exercise.        Expected Outcomes  Zachary Ponce will report decreased stress and anxiety with ability to manage both with good coping skills.        Interventions  Stress management education;Relaxation education;Encouraged to attend Cardiac Rehabilitation for the exercise       Continue Psychosocial Services   Follow up required by staff          Psychosocial Discharge (Final Psychosocial Re-Evaluation): Psychosocial Re-Evaluation - 06/08/18 1517      Psychosocial Re-Evaluation   Current issues with  Current Anxiety/Panic;Current Stress Concerns    Comments  Rainen previously struggled with not being able to golf or travel like he was once able to.  He has booked a trip and is seeing his ability with exercise.     Expected Outcomes  Zachary Ponce will report decreased stress and  anxiety with ability to manage both with good coping skills.     Interventions  Stress management education;Relaxation education;Encouraged  to attend Cardiac Rehabilitation for the exercise    Continue Psychosocial Services   Follow up required by staff       Vocational Rehabilitation: Provide vocational rehab assistance to qualifying candidates.   Vocational Rehab Evaluation & Intervention: Vocational Rehab - 05/23/18 1411      Initial Vocational Rehab Evaluation & Intervention   Assessment shows need for Vocational Rehabilitation  No   retired       Education: Education Goals: Education classes will be provided on a weekly basis, covering required topics. Participant will state understanding/return demonstration of topics presented.  Learning Barriers/Preferences: Learning Barriers/Preferences - 05/23/18 1524      Learning Barriers/Preferences   Learning Barriers  Sight    Learning Preferences  Skilled Demonstration       Education Topics: Count Your Pulse:  -Group instruction provided by verbal instruction, demonstration, patient participation and written materials to support subject.  Instructors address importance of being able to find your pulse and how to count your pulse when at home without a heart monitor.  Patients get hands on experience counting their pulse with staff help and individually.   Heart Attack, Angina, and Risk Factor Modification:  -Group instruction provided by verbal instruction, video, and written materials to support subject.  Instructors address signs and symptoms of angina and heart attacks.    Also discuss risk factors for heart disease and how to make changes to improve heart health risk factors.   CARDIAC REHAB PHASE II EXERCISE from 06/07/2018 in [facility] CARDIAC REHAB  Date  06/07/18  (Pended)   Instruction Review Code  2- Demonstrated Understanding  (Pended)       Functional Fitness:  -Group instruction provided by verbal instruction, demonstration, patient participation, and written materials to support subject.  Instructors address safety measures for  doing things around the house.  Discuss how to get up and down off the floor, how to pick things up properly, how to safely get out of a chair without assistance, and balance training.   Meditation and Mindfulness:  -Group instruction provided by verbal instruction, patient participation, and written materials to support subject.  Instructor addresses importance of mindfulness and meditation practice to help reduce stress and improve awareness.  Instructor also leads participants through a meditation exercise.    Stretching for Flexibility and Mobility:  -Group instruction provided by verbal instruction, patient participation, and written materials to support subject.  Instructors lead participants through series of stretches that are designed to increase flexibility thus improving mobility.  These stretches are additional exercise for major muscle groups that are typically performed during regular warm up and cool down.   Hands Only CPR:  -Group verbal, video, and participation provides a basic overview of AHA guidelines for community CPR. Role-play of emergencies allow participants the opportunity to practice calling for help and chest compression technique with discussion of AED use.   Hypertension: -Group verbal and written instruction that provides a basic overview of hypertension including the most recent diagnostic guidelines, risk factor reduction with self-care instructions and medication management.    Nutrition I class: Heart Healthy Eating:  -Group instruction provided by PowerPoint slides, verbal discussion, and written materials to support subject matter. The instructor gives an explanation and review of the Therapeutic Lifestyle Changes diet recommendations, which includes a discussion on lipid goals, dietary fat, sodium, fiber, plant stanol/sterol esters,  sugar, and the components of a well-balanced, healthy diet.   Nutrition II class: Lifestyle Skills:  -Group instruction  provided by PowerPoint slides, verbal discussion, and written materials to support subject matter. The instructor gives an explanation and review of label reading, grocery shopping for heart health, heart healthy recipe modifications, and ways to make healthier choices when eating out.   Diabetes Question & Answer:  -Group instruction provided by PowerPoint slides, verbal discussion, and written materials to support subject matter. The instructor gives an explanation and review of diabetes co-morbidities, pre- and post-prandial blood glucose goals, pre-exercise blood glucose goals, signs, symptoms, and treatment of hypoglycemia and hyperglycemia, and foot care basics.   Diabetes Blitz:  -Group instruction provided by PowerPoint slides, verbal discussion, and written materials to support subject matter. The instructor gives an explanation and review of the physiology behind type 1 and type 2 diabetes, diabetes medications and rational behind using different medications, pre- and post-prandial blood glucose recommendations and Hemoglobin A1c goals, diabetes diet, and exercise including blood glucose guidelines for exercising safely.    Portion Distortion:  -Group instruction provided by PowerPoint slides, verbal discussion, written materials, and food models to support subject matter. The instructor gives an explanation of serving size versus portion size, changes in portions sizes over the last 20 years, and what consists of a serving from each food group.   Stress Management:  -Group instruction provided by verbal instruction, video, and written materials to support subject matter.  Instructors review role of stress in heart disease and how to cope with stress positively.     Exercising on Your Own:  -Group instruction provided by verbal instruction, power point, and written materials to support subject.  Instructors discuss benefits of exercise, components of exercise, frequency and intensity of  exercise, and end points for exercise.  Also discuss use of nitroglycerin and activating EMS.  Review options of places to exercise outside of rehab.  Review guidelines for sex with heart disease.   Cardiac Drugs I:  -Group instruction provided by verbal instruction and written materials to support subject.  Instructor reviews cardiac drug classes: antiplatelets, anticoagulants, beta blockers, and statins.  Instructor discusses reasons, side effects, and lifestyle considerations for each drug class.   Cardiac Drugs II:  -Group instruction provided by verbal instruction and written materials to support subject.  Instructor reviews cardiac drug classes: angiotensin converting enzyme inhibitors (ACE-I), angiotensin II receptor blockers (ARBs), nitrates, and calcium channel blockers.  Instructor discusses reasons, side effects, and lifestyle considerations for each drug class.   Anatomy and Physiology of the Circulatory System:  Group verbal and written instruction and models provide basic cardiac anatomy and physiology, with the coronary electrical and arterial systems. Review of: AMI, Angina, Valve disease, Heart Failure, Peripheral Artery Disease, Cardiac Arrhythmia, Pacemakers, and the ICD.   Other Education:  -Group or individual verbal, written, or video instructions that support the educational goals of the cardiac rehab program.   Holiday Eating Survival Tips:  -Group instruction provided by PowerPoint slides, verbal discussion, and written materials to support subject matter. The instructor gives patients tips, tricks, and techniques to help them not only survive but enjoy the holidays despite the onslaught of food that accompanies the holidays.   Knowledge Questionnaire Score: Knowledge Questionnaire Score - 05/23/18 1409      Knowledge Questionnaire Score   Pre Score  22/24       Core Components/Risk Factors/Patient Goals at Admission: Personal Goals and Risk Factors at  Admission - 05/23/18  1419      Core Components/Risk Factors/Patient Goals on Admission    Weight Management  Weight Maintenance    Lipids  Yes    Intervention  Provide education and support for participant on nutrition & aerobic/resistive exercise along with prescribed medications to achieve LDL 70mg , HDL >40mg .    Expected Outcomes  Short Term: Participant states understanding of desired cholesterol values and is compliant with medications prescribed. Participant is following exercise prescription and nutrition guidelines.;Long Term: Cholesterol controlled with medications as prescribed, with individualized exercise RX and with personalized nutrition plan. Value goals: LDL < 70mg , HDL > 40 mg.    Stress  Yes    Intervention  Offer individual and/or small group education and counseling on adjustment to heart disease, stress management and health-related lifestyle change. Teach and support self-help strategies.;Refer participants experiencing significant psychosocial distress to appropriate mental health specialists for further evaluation and treatment. When possible, include family members and significant others in education/counseling sessions.    Expected Outcomes  Short Term: Participant demonstrates changes in health-related behavior, relaxation and other stress management skills, ability to obtain effective social support, and compliance with psychotropic medications if prescribed.;Long Term: Emotional wellbeing is indicated by absence of clinically significant psychosocial distress or social isolation.       Core Components/Risk Factors/Patient Goals Review:  Goals and Risk Factor Review    Row Name 05/29/18 1708             Core Components/Risk Factors/Patient Goals Review   Personal Goals Review  Weight Management/Obesity;Lipids;Stress       Review  Pt with multiple CAD RFs willing to participate in CR exercise.  Pt would like to be able to travel and golf.       Expected Outcomes   Pt will continue to participate in CR exercise, nutrition, and lifestyle modification opportunities.          Core Components/Risk Factors/Patient Goals at Discharge (Final Review):  Goals and Risk Factor Review - 05/29/18 1708      Core Components/Risk Factors/Patient Goals Review   Personal Goals Review  Weight Management/Obesity;Lipids;Stress    Review  Pt with multiple CAD RFs willing to participate in CR exercise.  Pt would like to be able to travel and golf.    Expected Outcomes  Pt will continue to participate in CR exercise, nutrition, and lifestyle modification opportunities.       ITP Comments: ITP Comments    Row Name 05/22/18 1700 05/23/18 1409 05/29/18 1705       ITP Comments  Dr. Armanda Magic, Medical Director   Dr. Armanda Magic, Medical Director   30 Day ITP Review. Pt started cardiac rehab today and tolerated exercise well.         Comments: See ITP Comments.

## 2018-06-09 ENCOUNTER — Encounter (HOSPITAL_COMMUNITY)
Admission: RE | Admit: 2018-06-09 | Discharge: 2018-06-09 | Disposition: A | Discharge status: Home / Self Care | Payer: Medicare Other | Source: Ambulatory Visit | Attending: Pulmonary Disease | Admitting: Pulmonary Disease

## 2018-06-09 ENCOUNTER — Encounter (HOSPITAL_COMMUNITY): Payer: Medicare Other

## 2018-06-09 DIAGNOSIS — Z951 Presence of aortocoronary bypass graft: Secondary | ICD-10-CM

## 2018-06-09 DIAGNOSIS — I2510 Atherosclerotic heart disease of native coronary artery without angina pectoris: Secondary | ICD-10-CM | POA: Diagnosis not present

## 2018-06-12 ENCOUNTER — Encounter (HOSPITAL_COMMUNITY)
Admission: RE | Admit: 2018-06-12 | Discharge: 2018-06-12 | Disposition: A | Discharge status: Home / Self Care | Payer: Medicare Other | Source: Ambulatory Visit | Attending: Pulmonary Disease | Admitting: Pulmonary Disease

## 2018-06-12 ENCOUNTER — Encounter (HOSPITAL_COMMUNITY): Payer: Medicare Other

## 2018-06-12 DIAGNOSIS — I2510 Atherosclerotic heart disease of native coronary artery without angina pectoris: Secondary | ICD-10-CM | POA: Diagnosis not present

## 2018-06-12 DIAGNOSIS — Z951 Presence of aortocoronary bypass graft: Secondary | ICD-10-CM

## 2018-06-14 ENCOUNTER — Encounter (HOSPITAL_COMMUNITY): Payer: Medicare Other

## 2018-06-14 ENCOUNTER — Encounter (HOSPITAL_COMMUNITY)
Admission: RE | Admit: 2018-06-14 | Discharge: 2018-06-14 | Disposition: A | Discharge status: Home / Self Care | Payer: Medicare Other | Source: Ambulatory Visit | Attending: Pulmonary Disease | Admitting: Pulmonary Disease

## 2018-06-14 DIAGNOSIS — Z951 Presence of aortocoronary bypass graft: Secondary | ICD-10-CM

## 2018-06-14 DIAGNOSIS — I2510 Atherosclerotic heart disease of native coronary artery without angina pectoris: Secondary | ICD-10-CM | POA: Diagnosis not present

## 2018-06-16 ENCOUNTER — Encounter (HOSPITAL_COMMUNITY)
Admission: RE | Admit: 2018-06-16 | Discharge: 2018-06-16 | Disposition: A | Discharge status: Home / Self Care | Payer: Medicare Other | Source: Ambulatory Visit | Attending: Pulmonary Disease | Admitting: Pulmonary Disease

## 2018-06-16 ENCOUNTER — Encounter (HOSPITAL_COMMUNITY): Payer: Medicare Other

## 2018-06-16 DIAGNOSIS — I2510 Atherosclerotic heart disease of native coronary artery without angina pectoris: Secondary | ICD-10-CM | POA: Diagnosis not present

## 2018-06-16 DIAGNOSIS — Z951 Presence of aortocoronary bypass graft: Secondary | ICD-10-CM

## 2018-06-19 ENCOUNTER — Encounter (HOSPITAL_COMMUNITY): Payer: Medicare Other

## 2018-06-20 DIAGNOSIS — E291 Testicular hypofunction: Secondary | ICD-10-CM | POA: Insufficient documentation

## 2018-06-20 HISTORY — DX: Testicular hypofunction: E29.1

## 2018-06-21 ENCOUNTER — Encounter (HOSPITAL_COMMUNITY): Payer: Medicare Other

## 2018-06-21 ENCOUNTER — Encounter (HOSPITAL_COMMUNITY)
Admission: RE | Admit: 2018-06-21 | Discharge: 2018-06-21 | Disposition: A | Discharge status: Home / Self Care | Payer: Medicare Other | Source: Ambulatory Visit | Attending: Pulmonary Disease | Admitting: Pulmonary Disease

## 2018-06-21 DIAGNOSIS — I2510 Atherosclerotic heart disease of native coronary artery without angina pectoris: Secondary | ICD-10-CM | POA: Diagnosis not present

## 2018-06-21 DIAGNOSIS — Z951 Presence of aortocoronary bypass graft: Secondary | ICD-10-CM

## 2018-06-21 NOTE — Progress Notes (Signed)
I have reviewed a Home Exercise Prescription with Zachary Ponce . Zachary Ponce is currently exercising at home. The patient was advised to continue to walk daily and exercise 2-3 days a week for 30-70minutes at local gym.  Zachary Ponce and I discussed how to progress their exercise prescription. The patient stated that their goals were to return to swimming and golfing. Pt is hopeful to be cleared by physician 06/22/2018.  The patient stated that they understand the exercise prescription.  We reviewed exercise guidelines, target heart rate during exercise, RPE Scale, weather conditions, NTG use, endpoints for exercise, warmup and cool down.  Patient is encouraged to come to me with any questions. I will continue to follow up with the patient to assist them with progression and safety.     York Cerise MS, ACSM CEP. 2:37 PM 06/12/2018

## 2018-06-22 ENCOUNTER — Encounter: Payer: Self-pay | Admitting: Cardiology

## 2018-06-22 ENCOUNTER — Ambulatory Visit: Payer: Medicare Other | Admitting: Cardiology

## 2018-06-22 VITALS — BP 122/78 | HR 56 | Ht 70.0 in | Wt 168.0 lb

## 2018-06-22 DIAGNOSIS — Z79899 Other long term (current) drug therapy: Secondary | ICD-10-CM | POA: Insufficient documentation

## 2018-06-22 DIAGNOSIS — I6523 Occlusion and stenosis of bilateral carotid arteries: Secondary | ICD-10-CM

## 2018-06-22 DIAGNOSIS — I480 Paroxysmal atrial fibrillation: Secondary | ICD-10-CM

## 2018-06-22 DIAGNOSIS — E785 Hyperlipidemia, unspecified: Secondary | ICD-10-CM

## 2018-06-22 DIAGNOSIS — Z87828 Personal history of other (healed) physical injury and trauma: Secondary | ICD-10-CM

## 2018-06-22 DIAGNOSIS — I9581 Postprocedural hypotension: Secondary | ICD-10-CM | POA: Insufficient documentation

## 2018-06-22 DIAGNOSIS — I25119 Atherosclerotic heart disease of native coronary artery with unspecified angina pectoris: Secondary | ICD-10-CM | POA: Diagnosis not present

## 2018-06-22 HISTORY — DX: Other long term (current) drug therapy: Z79.899

## 2018-06-22 HISTORY — DX: Paroxysmal atrial fibrillation: I48.0

## 2018-06-22 HISTORY — DX: Postprocedural hypotension: I95.81

## 2018-06-22 NOTE — Patient Instructions (Addendum)
Medication Instructions:  Your physician recommends that you continue on your current medications as directed. Please refer to the Current Medication list given to you today.  If you need a refill on your cardiac medications before your next appointment, please call your pharmacy.   Lab work: Your physician recommends that you return for lab work today: CMP, lipid panel.  If you have labs (blood work) drawn today and your tests are completely normal, you will receive your results only by: [name] MyChart Message (if you have MyChart) OR . A paper copy in the mail If you have any lab test that is abnormal or we need to change your treatment, we will call you to review the results.  Testing/Procedures: Your physician has recommended that you wear a ZIO patch monitor. ZIO patch monitors are medical devices that record the heart's electrical activity. Doctors most often use these monitors to diagnose arrhythmias. Arrhythmias are problems with the speed or rhythm of the heartbeat. The monitor is a small, portable device. You can wear one while you do your normal daily activities. This is usually used to diagnose what is causing palpitations/syncope (passing out). Wear for 14 days.   Follow-Up: At [facility], you and your health needs are our priority.  As part of our continuing mission to provide you with exceptional heart care, we have created designated Provider Care Teams.  These Care Teams include your primary Cardiologist (physician) and Advanced Practice Providers (APPs -  Physician Assistants and Nurse Practitioners) who all work together to provide you with the care you need, when you need it. . You will need a follow up appointment in 3 months with Dr. Donnie Aho.       KardiaMobile [email or web address]        FDA-cleared, clinical grade mobile EKG monitor: Lourena Simmonds is the most clinically-validated mobile EKG used by the world's leading cardiac care medical  professionals With Basic service, know instantly if your heart rhythm is normal or if atrial fibrillation is detected, and email the last single EKG recording to yourself or your doctor Premium service, available for purchase through the Kardia app for $9.99 per month or $99 per year, includes unlimited history and storage of your EKG recordings, a monthly EKG summary report to share with your doctor, along with the ability to track your blood pressure, activity and weight Includes one KardiaMobile phone clip FREE SHIPPING: Standard delivery 1-3 business days. Orders placed by 11:00am PST will ship that afternoon. Otherwise, will ship next business day. All orders ship via [employer] from [place], [place]

## 2018-06-22 NOTE — Progress Notes (Signed)
Cardiology Office Note:    Date:  06/22/2018   ID:  Zachary Ponce, DOB 1950/12/06, MRN [id number]  PCP:  [name], MD  Cardiologist:  [name], MD    Referring MD: [name], MD    ASSESSMENT:    1. Coronary artery disease involving native coronary artery of native heart with angina pectoris (HCC)   2. Bilateral carotid artery stenosis   3. PAF (paroxysmal atrial fibrillation) (HCC)   4. On amiodarone therapy   5. History of traumatic head injury   6. Dyslipidemia   7. Hypotension after procedure    PLAN:    In order of problems listed above:  1.    Stable New [place] Heart Association class I full recovery from bypass surgery continue his current medical treatment 2.     He is followed up with vascular surgery continue his antiplatelet agent statin 3.   He is asymptomatic no clinical recurrence off amiodarone continue beta-blocker plan for outpatient extended monitor if no complex atrial arrhythmia consider withdrawing his anticoagulant provided he screens himself with a home heart monitor iPhone adapter 4.    Stable off amiodarone continue beta-blocker no clinical recurrence 5.     He continues to have problems with disequilibrium and is interested to see whether anticoagulation can be withdrawn 6.    Check CMP for liver function lipid profile goal LDL less than 70 7.    Improved no longer an alpha agonist  Next appointment: 3 months   Medication Adjustments/Labs and Tests Ordered: Current medicines are reviewed at length with the patient today.  Concerns regarding medicines are outlined above.  No orders of the defined types were placed in this encounter.  No orders of the defined types were placed in this encounter.   Chief Complaint  Patient presents with  . Follow-up  . Atrial Fibrillation    on amiodarone  . Hyperlipidemia    History of Present Illness:    Zachary Ponce is a 67 y.o. male with a hx of CAD with coronary artery bypass  surgery 0 03/07/2018 with left carotid endarterectomy at the same time last seen by Dr. Donnie Aho 03/23/2018.  Other problems include paroxysmal atrial fibrillation anticoagulation and hyper lipidemia.  At the time of his last visit he was continued on amiodarone dosage was reduced and a follow-up treadmill stress test performed which showed a normal exercise tolerance 8.5 minutes EKG response was normal and blood pressure response was normal.  Most recent labs 01/18/2018 shows cholesterol 209 LDL 138 HDL 32 normal liver function. Compliance with diet, lifestyle and medications: Yes  He is made a complete and full recovery from his bypass surgery.  He is taking propranolol because an intention tremor compliant with his medications and has had no palpitation and inquires whether he needs to be anticoagulate the rest of his life he had atrial fibrillation in setting of acute coronary syndrome and bypass surgery.  We discussed screening him with a 14-day external loop recorder and if he has no arrhythmia with strong Eliquis divided he buys an adapter for his iPhone and screens EKGs daily.  We will plan on doing this after he returns from a trip to the [facility] he wants to resume full activities gym golfing and I think he is ready.  He had no chest pain shortness of breath palpitation or syncope. Past Medical History:  Diagnosis Date  . ADHD (attention deficit hyperactivity disorder) 03/03/2018  . CAD (coronary artery disease) 03/03/2018  Cardiac cath  03/03/18 70% distal left main, 99% proximal LAD, 40-50% proximal circumflex, 60% intermediate, 90% ostial right coronary artery, 55% LVEF with some mild distal anterior hypokinesis  . Depression 06/30/2016  . Essential tremor   . History of traumatic head injury   . Hyperlipidemia 03/03/2018    Past Surgical History:  Procedure Laterality Date  . ANKLE SURGERY    . CORONARY ARTERY BYPASS GRAFT N/A 03/08/2018   Procedure: CORONARY ARTERY BYPASS GRAFTING (CABG)  X4, RIGHT SAPHENOUS VEIN HARVEST. LIMA TO LAD, SVG TO OM, SVG TO RAMUS, SVG TO PD.;  Surgeon: [name], MD;  Location: [facility] OR;  Service: Open Heart Surgery;  Laterality: N/A;  . ENDARTERECTOMY Left 03/08/2018   Procedure: ENDARTERECTOMY CAROTID;  Surgeon: [name], MD;  Location: [facility] OR;  Service: Vascular;  Laterality: Left;  . KNEE SURGERY    . LEFT HEART CATH AND CORONARY ANGIOGRAPHY N/A 03/03/2018   Procedure: LEFT HEART CATH AND CORONARY ANGIOGRAPHY;  Surgeon: [name], MD;  Location: [facility] INVASIVE CV LAB;  Service: Cardiovascular;  Laterality: N/A;  . TEE WITHOUT CARDIOVERSION N/A 03/08/2018   Procedure: TRANSESOPHAGEAL ECHOCARDIOGRAM (TEE);  Surgeon: [name], [name], MD;  Location: [facility] OR;  Service: Open Heart Surgery;  Laterality: N/A;  . TONSILLECTOMY      Current Medications: Current Meds  Medication Sig  . apixaban (ELIQUIS) 5 MG TABS tablet Take 1 tablet (5 mg total) by mouth 2 (two) times daily.  [name] aspirin EC 81 MG EC tablet Take 1 tablet (81 mg total) by mouth daily.  [name] atorvastatin (LIPITOR) 20 MG tablet Take 1 tablet (20 mg total) by mouth daily at 6 PM.  . buPROPion (WELLBUTRIN XL) 300 MG 24 hr tablet Take 300 mg by mouth daily.  [name] escitalopram (LEXAPRO) 5 MG tablet Take 5 mg by mouth daily.  . NON FORMULARY Apply 50 mg topically daily. Testosterone 50mg /ml  . propranolol ER (INDERAL LA) 80 MG 24 hr capsule Take 80 mg by mouth daily.  [name] VYVANSE 30 MG capsule Take 30 mg by mouth daily.     Allergies:   Patient has no known allergies.   Social History   Socioeconomic History  . Marital status: Married    Spouse name: Not on file  . Number of children: Not on file  . Years of education: Not on file  . Highest education level: Not on file  Occupational History  . Not on file  Social Needs  . Financial resource strain: Not on file  . Food insecurity:    Worry: Not on file    Inability: Not on file  . Transportation needs:    Medical: Not on file     Non-medical: Not on file  Tobacco Use  . Smoking status: Never Smoker  . Smokeless tobacco: Never Used  Substance and Sexual Activity  . Alcohol use: Not Currently  . Drug use: Never  . Sexual activity: Yes  Lifestyle  . Physical activity:    Days per week: Not on file    Minutes per session: Not on file  . Stress: Not on file  Relationships  . Social connections:    Talks on phone: Not on file    Gets together: Not on file    Attends religious service: Not on file    Active member of club or organization: Not on file    Attends meetings of clubs or organizations: Not on file    Relationship status: Not on  file  Other Topics Concern  . Not on file  Social History Narrative  . Not on file     Family History: The patient's family history includes Diabetes in his brother; Lung cancer in his mother. ROS:   Please see the history of present illness.    All other systems reviewed and are negative.  EKGs/Labs/Other Studies Reviewed:    The following studies were reviewed today:  Recent Labs: 03/04/2018: ALT 18; TSH 0.853 03/09/2018: Magnesium 2.5 03/14/2018: BUN 24; Creatinine, Ser 1.05; Hemoglobin 10.3; Platelets 281; Potassium 4.1; Sodium 134  Recent Lipid Panel No results found for: CHOL, TRIG, HDL, CHOLHDL, VLDL, LDLCALC, LDLDIRECT  Physical Exam:    VS:  BP 122/78 (BP Location: Right Arm, Patient Position: Sitting, Cuff Size: Normal)   Pulse (!) 56   Ht 5\' 10"  (1.778 m)   Wt 168 lb (76.2 kg)   SpO2 96%   BMI 24.11 kg/m     Wt Readings from Last 3 Encounters:  06/22/18 168 lb (76.2 kg)  05/23/18 167 lb 15.9 oz (76.2 kg)  04/11/18 164 lb (74.4 kg)     GEN:  Well nourished, well developed in no acute distress HEENT: Normal NECK: No JVD; No carotid bruits LYMPHATICS: No lymphadenopathy CARDIAC: RRR, no murmurs, rubs, gallops RESPIRATORY:  Clear to auscultation without rales, wheezing or rhonchi  ABDOMEN: Soft, non-tender, non-distended MUSCULOSKELETAL:  No  edema; No deformity  SKIN: Warm and dry NEUROLOGIC:  Alert and oriented x 3 PSYCHIATRIC:  Normal affect    Signed, [name], MD  06/22/2018 1:52 PM    [facility]

## 2018-06-23 ENCOUNTER — Encounter (HOSPITAL_COMMUNITY)
Admission: RE | Admit: 2018-06-23 | Discharge: 2018-06-23 | Disposition: A | Discharge status: Home / Self Care | Payer: Medicare Other | Source: Ambulatory Visit | Attending: Pulmonary Disease | Admitting: Pulmonary Disease

## 2018-06-23 ENCOUNTER — Encounter (HOSPITAL_COMMUNITY): Payer: Medicare Other

## 2018-06-23 DIAGNOSIS — I2510 Atherosclerotic heart disease of native coronary artery without angina pectoris: Secondary | ICD-10-CM | POA: Diagnosis not present

## 2018-06-23 DIAGNOSIS — Z951 Presence of aortocoronary bypass graft: Secondary | ICD-10-CM

## 2018-06-23 LAB — COMPREHENSIVE METABOLIC PANEL
ALT: 48 IU/L — ABNORMAL HIGH (ref 0–44)
AST: 31 IU/L (ref 0–40)
Albumin/Globulin Ratio: 1.6 (ref 1.2–2.2)
Albumin: 4.2 g/dL (ref 3.6–4.8)
Alkaline Phosphatase: 149 IU/L — ABNORMAL HIGH (ref 39–117)
BUN/Creatinine Ratio: 26 — ABNORMAL HIGH (ref 10–24)
BUN: 23 mg/dL (ref 8–27)
Bilirubin Total: 0.5 mg/dL (ref 0.0–1.2)
CO2: 25 mmol/L (ref 20–29)
Calcium: 9.3 mg/dL (ref 8.6–10.2)
Chloride: 101 mmol/L (ref 96–106)
Creatinine, Ser: 0.87 mg/dL (ref 0.76–1.27)
GFR calc Af Amer: 103 mL/min/{1.73_m2} (ref >59)
GFR calc non Af Amer: 89 mL/min/{1.73_m2} (ref >59)
Globulin, Total: 2.6 g/dL (ref 1.5–4.5)
Glucose: 76 mg/dL (ref 65–99)
Potassium: 4.8 mmol/L (ref 3.5–5.2)
Sodium: 140 mmol/L (ref 134–144)
Total Protein: 6.8 g/dL (ref 6.0–8.5)

## 2018-06-23 LAB — LIPID PANEL
Chol/HDL Ratio: 3.2 ratio (ref 0.0–5.0)
Cholesterol, Total: 108 mg/dL (ref 100–199)
HDL: 34 mg/dL — ABNORMAL LOW (ref >39)
LDL Calculated: 52 mg/dL (ref 0–99)
Triglycerides: 108 mg/dL (ref 0–149)
VLDL Cholesterol Cal: 22 mg/dL (ref 5–40)

## 2018-06-26 ENCOUNTER — Encounter (HOSPITAL_COMMUNITY): Payer: Medicare Other

## 2018-06-26 ENCOUNTER — Encounter (HOSPITAL_COMMUNITY)
Admission: RE | Admit: 2018-06-26 | Discharge: 2018-06-26 | Disposition: A | Discharge status: Home / Self Care | Payer: Medicare Other | Source: Ambulatory Visit | Attending: Pulmonary Disease | Admitting: Pulmonary Disease

## 2018-06-26 DIAGNOSIS — I2510 Atherosclerotic heart disease of native coronary artery without angina pectoris: Secondary | ICD-10-CM | POA: Diagnosis not present

## 2018-06-26 DIAGNOSIS — Z951 Presence of aortocoronary bypass graft: Secondary | ICD-10-CM

## 2018-06-28 ENCOUNTER — Encounter (HOSPITAL_COMMUNITY)
Admission: RE | Admit: 2018-06-28 | Discharge: 2018-06-28 | Disposition: A | Discharge status: Home / Self Care | Payer: Medicare Other | Source: Ambulatory Visit | Attending: Pulmonary Disease | Admitting: Pulmonary Disease

## 2018-06-28 ENCOUNTER — Encounter (HOSPITAL_COMMUNITY): Payer: Medicare Other

## 2018-06-28 DIAGNOSIS — Z951 Presence of aortocoronary bypass graft: Secondary | ICD-10-CM

## 2018-06-28 DIAGNOSIS — I2510 Atherosclerotic heart disease of native coronary artery without angina pectoris: Secondary | ICD-10-CM | POA: Diagnosis not present

## 2018-06-30 ENCOUNTER — Encounter (HOSPITAL_COMMUNITY)
Admission: RE | Admit: 2018-06-30 | Discharge: 2018-06-30 | Disposition: A | Discharge status: Home / Self Care | Payer: Medicare Other | Source: Ambulatory Visit | Attending: Pulmonary Disease | Admitting: Pulmonary Disease

## 2018-06-30 ENCOUNTER — Encounter (HOSPITAL_COMMUNITY): Payer: Medicare Other

## 2018-06-30 DIAGNOSIS — Z951 Presence of aortocoronary bypass graft: Secondary | ICD-10-CM

## 2018-06-30 DIAGNOSIS — I2510 Atherosclerotic heart disease of native coronary artery without angina pectoris: Secondary | ICD-10-CM | POA: Diagnosis not present

## 2018-07-03 ENCOUNTER — Encounter (HOSPITAL_COMMUNITY): Payer: Medicare Other

## 2018-07-03 ENCOUNTER — Encounter (HOSPITAL_COMMUNITY)
Admission: RE | Admit: 2018-07-03 | Discharge: 2018-07-03 | Disposition: A | Discharge status: Home / Self Care | Payer: Medicare Other | Source: Ambulatory Visit | Attending: Pulmonary Disease | Admitting: Pulmonary Disease

## 2018-07-03 DIAGNOSIS — I2510 Atherosclerotic heart disease of native coronary artery without angina pectoris: Secondary | ICD-10-CM | POA: Diagnosis not present

## 2018-07-03 DIAGNOSIS — Z951 Presence of aortocoronary bypass graft: Secondary | ICD-10-CM

## 2018-07-05 ENCOUNTER — Encounter (HOSPITAL_COMMUNITY): Payer: Medicare Other

## 2018-07-05 ENCOUNTER — Encounter (HOSPITAL_COMMUNITY)
Admission: RE | Admit: 2018-07-05 | Discharge: 2018-07-05 | Disposition: A | Discharge status: Home / Self Care | Payer: Medicare Other | Source: Ambulatory Visit | Attending: Pulmonary Disease | Admitting: Pulmonary Disease

## 2018-07-05 DIAGNOSIS — I2510 Atherosclerotic heart disease of native coronary artery without angina pectoris: Secondary | ICD-10-CM | POA: Diagnosis not present

## 2018-07-05 DIAGNOSIS — Z951 Presence of aortocoronary bypass graft: Secondary | ICD-10-CM

## 2018-07-06 ENCOUNTER — Encounter (HOSPITAL_COMMUNITY): Payer: Self-pay

## 2018-07-06 NOTE — Progress Notes (Signed)
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  apixaban (ELIQUIS) 5 MG TABS tablet, Take 1 tablet (5 mg total) by mouth 2 (two) times daily., Disp: 60 tablet, Rfl: 3 .  aspirin EC 81 MG EC tablet, Take 1 tablet (81 mg total) by mouth daily., Disp: , Rfl:  .  atorvastatin (LIPITOR) 20 MG tablet, Take 1 tablet (20 mg total) by mouth daily at 6 PM., Disp: 30 tablet, Rfl: 3 .  buPROPion (WELLBUTRIN XL) 300 MG 24 hr tablet, Take 300 mg by mouth daily., Disp: , Rfl:  .  escitalopram (LEXAPRO) 5 MG tablet, Take 5 mg by mouth daily., Disp: , Rfl: 0 .  NON FORMULARY, Apply 50 mg topically daily. Testosterone 50mg /ml, Disp: , Rfl: 0 .  propranolol ER (INDERAL LA) 80 MG 24 hr capsule, Take 80 mg by mouth daily., Disp: , Rfl:  .  VYVANSE 30 MG capsule, Take 30 mg by mouth daily., Disp: , Rfl: 0  Past Medical History: Past Medical History:  Diagnosis Date  . ADHD (attention deficit hyperactivity disorder) 03/03/2018  . CAD (coronary artery disease) 03/03/2018   Cardiac cath  03/03/18 70% distal left main, 99% proximal LAD, 40-50% proximal circumflex, 60% intermediate, 90% ostial right coronary artery, 55% LVEF with some mild distal anterior hypokinesis  . Depression 06/30/2016  . Essential tremor   . History of traumatic head injury   . Hyperlipidemia 03/03/2018    Tobacco Use: Social History   Tobacco Use  Smoking Status Never Smoker  Smokeless Tobacco Never Used     Labs: Recent Review Flowsheet Data    Labs for ITP Cardiac and Pulmonary Rehab Latest Ref Rng & Units 03/08/2018 03/09/2018 03/10/2018 03/10/2018 06/22/2018   Cholestrol 100 - 199 mg/dL - - - - 409   LDLCALC 0 - 99 mg/dL - - - - 52   HDL >81 mg/dL - - - - 19(J)   Trlycerides 0 - 149 mg/dL - - - - 478   Hemoglobin A1c 4.8 - 5.6 % - - - - -   PHART 7.350 - 7.450 7.307(L) - - - -   PCO2ART 32.0 - 48.0 mmHg 44.7 - - - -   HCO3 20.0 - 28.0 mmol/L 22.6 - - - -   TCO2 22 - 32 mmol/L 24 22 24 24  -   ACIDBASEDEF 0.0 - 2.0 mmol/L 4.0(H) - - - -   O2SAT % 99.0 - - - -      Capillary Blood Glucose: Lab Results  Component Value Date   GLUCAP 133 (H) 03/09/2018   GLUCAP 125 (H) 03/09/2018   GLUCAP 114 (H) 03/09/2018   GLUCAP 119 (H) 03/09/2018   GLUCAP 150 (H) 03/08/2018     Exercise Target Goals: Exercise Program Goal: Individual exercise prescription set using results from initial 6 min walk test and THRR while considering  patient's activity barriers and safety.   Exercise Prescription Goal: Initial exercise prescription builds to 30-45  minutes a day of aerobic activity, 2-3 days per week.  Home exercise guidelines will be given to patient during program as part of exercise prescription that the participant will acknowledge.  Activity Barriers & Risk Stratification: Activity Barriers & Cardiac Risk Stratification - 05/23/18 1428      Activity Barriers & Cardiac Risk Stratification   Activity Barriers  None    Cardiac Risk Stratification  High       6 Minute Walk: 6 Minute Walk    Row Name 05/23/18 1400         6 Minute Walk   Phase  Initial     Distance  1757 feet     Walk Time  6 minutes     # of Rest Breaks  0     MPH  3.33     METS  3.94     RPE  9     Perceived Dyspnea   0     VO2 Peak  13.79     Symptoms  No     Resting HR  68 bpm     Resting BP  102/62     Resting Oxygen Saturation   98 %     Exercise Oxygen Saturation  during 6 min walk  99 %     Max Ex. HR   87 bpm     Max Ex. BP  122/68     2 Minute Post BP  104/72        Oxygen Initial Assessment:   Oxygen Re-Evaluation:   Oxygen Discharge (Final Oxygen Re-Evaluation):   Initial Exercise Prescription: Initial Exercise Prescription - 05/23/18 1400      Date of Initial Exercise RX and Referring Provider   Date  05/23/18    Referring Provider  Othella Boyer, MD      Treadmill   MPH  2.6    Grade  1    Minutes  10    METs  3.35      Bike   Level  1.2    Minutes  10    METs  3.93      NuStep   Level  4    SPM  85    Minutes  10    METs  3      Prescription Details   Frequency (times per week)  3    Duration  Progress to 30 minutes of continuous aerobic without signs/symptoms of physical distress      Intensity   THRR 40-80% of Max Heartrate  61-122    Ratings of Perceived Exertion  11-13    Perceived Dyspnea  0-4      Progression   Progression  Continue to progress workloads to maintain intensity without signs/symptoms of physical distress.      Resistance Training   Training Prescription  Yes    Weight  3lbs    Reps  10-15       Perform Capillary Blood Glucose checks as needed.  Exercise Prescription Changes: Exercise Prescription Changes    Row Name 05/29/18 1600 06/07/18 0733 06/16/18 1028 07/03/18 0757       Response to Exercise   Blood Pressure (Admit)  102/70  116/62  114/72  148/82    Blood Pressure (Exercise)  146/70  118/74  120/60  156/70    Blood Pressure (Exit)  104/62  104/60  102/64  104/60    Heart Rate (Admit)  63 bpm  69 bpm  74 bpm  63 bpm    Heart Rate (Exercise)  91 bpm  116 bpm  111 bpm  102 bpm    Heart Rate (Exit)  61 bpm  79 bpm  82 bpm  63 bpm    Rating of Perceived Exertion (Exercise)  12  12  13  12     Perceived Dyspnea (Exercise)  0  0  0  0    Symptoms  None  None  None  None    Comments  Pt's first day of exercise   None  None  None    Duration  Progress to 30 minutes of  aerobic without signs/symptoms of physical  distress  Progress to 30 minutes of  aerobic without signs/symptoms of physical distress  Progress to 30 minutes of  aerobic without signs/symptoms of physical distress  Progress to 30 minutes of  aerobic without signs/symptoms of physical distress    Intensity  THRR unchanged  THRR unchanged  THRR unchanged  THRR unchanged      Progression   Progression  Continue to progress workloads to maintain intensity without signs/symptoms of physical distress.  Continue to progress workloads to maintain intensity without signs/symptoms of physical distress.  Continue to progress workloads to maintain intensity without signs/symptoms of physical distress.  Continue to progress workloads to maintain intensity without signs/symptoms of physical distress.    Average METs  3.65  4.12  4.15  4.49      Resistance Training   Training Prescription  Yes  No  Yes  Yes    Weight  3lbs  -  4lbs  4lbs    Reps  10-15  -  10-15  10-15    Time  10 Minutes  -  10 Minutes  10 Minutes      Interval Training   Interval Training  No  No  No  No      Treadmill   MPH  2.6  3.4  3.4  3.6    Grade  1  2  2  2     Minutes  10  10  10  10     METs  3.35  4.54  4.54  4.75      Bike   Level  1.2  1.4  1.4  1.7    Minutes  10  10  10  10     METs  4  4.51  4.51  5.23      NuStep   Level  4  4  4  5     SPM  95  95  95  95    Minutes  10  10  10  10     METs  3.6  3.3  3.4  3.5      Home Exercise Plan   Plans to continue exercise at  -  -  Home (comment) Walking, Biking, Swimming  Home (comment) Walking, Biking, Swimming    Frequency  -  -  Add 2 additional days to program exercise sessions.  Add 2 additional days to program exercise sessions.    Initial Home Exercises Provided  -  -  06/12/18  06/12/18       Exercise Comments: Exercise Comments    Row Name 05/29/18 1632 06/21/18 1427         Exercise Comments  Today, was pt's first day of exercise. Pt responded well to exercise workloads. Will continue to monitor  and progress pt as tolerated.   Reviewed HEP with pt. Pt  verbalized understanding. Will continue to monitor and progress pt as tolerated.          Exercise Goals and Review: Exercise Goals    Row Name 05/23/18 1429             Exercise Goals   Increase Physical Activity  Yes       Intervention  Provide advice, education, support and counseling about physical activity/exercise needs.;Develop an individualized exercise prescription for aerobic and resistive training based on initial evaluation findings, risk stratification, comorbidities and participant's personal goals.       Expected Outcomes  Short Term: Attend rehab on a regular basis to increase amount of physical activity.;Long Term: Exercising regularly at least 3-5 days a week.;Long Term: Add in home exercise to make exercise part of routine and to increase amount of physical activity.       Increase Strength and Stamina  Yes       Intervention  Provide advice, education, support and counseling about physical activity/exercise needs.;Develop an individualized exercise prescription for aerobic and resistive training based on initial evaluation findings, risk stratification, comorbidities and participant's personal goals.       Expected Outcomes  Short Term: Increase workloads from initial exercise prescription for resistance, speed, and METs.;Short Term: Perform resistance training exercises routinely during rehab and add in resistance training at home;Long Term: Improve cardiorespiratory fitness, muscular endurance and strength as measured by increased METs and functional capacity ( )       Able to understand and use rate of perceived exertion (RPE) scale  Yes       Intervention  Provide education and explanation on how to use RPE scale       Expected Outcomes  Short Term: Able to use RPE daily in rehab to express subjective intensity level;Long Term:  Able to use RPE to guide intensity level when exercising independently        Knowledge and understanding of Target Heart Rate Range (THRR)  Yes       Intervention  Provide education and explanation of THRR including how the numbers were predicted and where they are located for reference       Expected Outcomes  Short Term: Able to state/look up THRR;Long Term: Able to use THRR to govern intensity when exercising independently;Short Term: Able to use daily as guideline for intensity in rehab       Able to check pulse independently  Yes       Intervention  Provide education and demonstration on how to check pulse in carotid and radial arteries.;Review the importance of being able to check your own pulse for safety during independent exercise       Expected Outcomes  Short Term: Able to explain why pulse checking is important during independent exercise;Long Term: Able to check pulse independently and accurately       Understanding of Exercise Prescription  Yes       Intervention  Provide education, explanation, and written materials on patient's individual exercise prescription       Expected Outcomes  Short Term: Able to explain program exercise prescription;Long Term: Able to explain home exercise prescription to exercise independently          Exercise Goals Re-Evaluation : Exercise Goals Re-Evaluation    Row Name 06/21/18 1428             Exercise Goal Re-Evaluation   Exercise Goals Review  Understanding of Exercise Prescription;Knowledge and understanding of Target Heart Rate Range (THRR);Able to understand  and use rate of perceived exertion (RPE) scale;Increase Physical Activity;Increase Strength and Stamina;Able to check pulse independently       Comments  Reveiwed HEP with pt. Also reviewed THRR, RPE Scale, weather conditions, endpoints of exercise, NTG use, warmup and cool down.        Expected Outcomes  Pt will continue to walk daily. Pt has silversneakers and goes to local gym 2-3 days a week. Pt really wants to return back to swimming and golfing. Pt has  follow up appointment with Dr. 06/22/2018. Pt is hopeful he will be cleared to return to those activities.           Discharge Exercise Prescription (Final Exercise Prescription Changes): Exercise Prescription Changes - 07/03/18 0757      Response to Exercise   Blood Pressure (Admit)  148/82    Blood Pressure (Exercise)  156/70    Blood Pressure (Exit)  104/60    Heart Rate (Admit)  63 bpm    Heart Rate (Exercise)  102 bpm    Heart Rate (Exit)  63 bpm    Rating of Perceived Exertion (Exercise)  12    Perceived Dyspnea (Exercise)  0    Symptoms  None    Comments  None    Duration  Progress to 30 minutes of  aerobic without signs/symptoms of physical distress    Intensity  THRR unchanged      Progression   Progression  Continue to progress workloads to maintain intensity without signs/symptoms of physical distress.    Average METs  4.49      Resistance Training   Training Prescription  Yes    Weight  4lbs    Reps  10-15    Time  10 Minutes      Interval Training   Interval Training  No      Treadmill   MPH  3.6    Grade  2    Minutes  10    METs  4.75      Bike   Level  1.7    Minutes  10    METs  5.23      NuStep   Level  5    SPM  95    Minutes  10    METs  3.5      Home Exercise Plan   Plans to continue exercise at  Home (comment)   Walking, Biking, Swimming   Frequency  Add 2 additional days to program exercise sessions.    Initial Home Exercises Provided  06/12/18       Nutrition:  Target Goals: Understanding of nutrition guidelines, daily intake of sodium 1500mg , cholesterol 200mg , calories 30% from fat and 7% or less from saturated fats, daily to have 5 or more servings of fruits and vegetables.  Biometrics: Pre Biometrics - 05/23/18 1400      Pre Biometrics   Height  5\' 10"  (1.778 m)    Weight  76.2 kg    Waist Circumference  36 inches    Hip Circumference  39.25 inches    Waist to Hip Ratio  0.92 %    BMI (Calculated)  24.1    Triceps  Skinfold  11 mm    % Body Fat  22.9 %    Grip Strength  34 kg    Flexibility  8 in    Single Leg Stand  29.06 seconds        Nutrition Therapy Plan and Nutrition Goals: Nutrition Therapy &  Goals - 05/24/18 0802      Nutrition Therapy   Diet  heart healthy      Personal Nutrition Goals   Nutrition Goal  Pt to identify and limit food sources of saturated fat, trans fat, refined carbohydrates and sodium    Personal Goal #2  Pt to cut down on beef or pork high in saturated fat.    Personal Goal #3  Pt to eat more lean protein (fish, chicken, [place])    Personal Goal #4  Pt able to name foods that affect blood glucose.      Intervention Plan   Intervention  Prescribe, educate and counsel regarding individualized specific dietary modifications aiming towards targeted core components such as weight, hypertension, lipid management, diabetes, heart failure and other comorbidities.    Expected Outcomes  Short Term Goal: Understand basic principles of dietary content, such as calories, fat, sodium, cholesterol and nutrients.;Long Term Goal: Adherence to prescribed nutrition plan.       Nutrition Assessments: Nutrition Assessments - 05/24/18 0831      MEDFICTS Scores   Pre Score  74       Nutrition Goals Re-Evaluation:   Nutrition Goals Re-Evaluation:   Nutrition Goals Discharge (Final Nutrition Goals Re-Evaluation):   Psychosocial: Target Goals: Acknowledge presence or absence of significant depression and/or stress, maximize coping skills, provide positive support system. Participant is able to verbalize types and ability to use techniques and skills needed for reducing stress and depression.  Initial Review & Psychosocial Screening: Initial Psych Review & Screening - 05/23/18 1409      Initial Review   Current issues with  History of Depression;Current Anxiety/Panic;Current Stress Concerns    Source of Stress Concerns  Chronic Illness    Comments  h/o stress and anxiety        Family Dynamics   Good Support System?  Yes   spouse, son      Barriers   Psychosocial barriers to participate in program  The patient should benefit from training in stress management and relaxation.      Screening Interventions   Interventions  Encouraged to exercise;To provide support and resources with identified psychosocial needs;Provide feedback about the scores to participant    Expected Outcomes  Short Term goal: Utilizing psychosocial counselor, staff and physician to assist with identification of specific Stressors or current issues interfering with healing process. Setting desired goal for each stressor or current issue identified.;Long Term Goal: Stressors or current issues are controlled or eliminated.;Short Term goal: Identification and review with participant of any Quality of Life or Depression concerns found by scoring the questionnaire.;Long Term goal: The participant improves quality of Life and PHQ9 Scores as seen by post scores and/or verbalization of changes       Quality of Life Scores: Quality of Life - 05/23/18 1418      Quality of Life   Select  Quality of Life      Quality of Life Scores   Health/Function Pre  15.63 %    Socioeconomic Pre  21.86 %    Psych/Spiritual Pre  12.5 %    Family Pre  17.7 %    GLOBAL Pre  16.57 %      Scores of 19 and below usually indicate a poorer quality of life in these areas.  A difference of  2-3 points is a clinically meaningful difference.  A difference of 2-3 points in the total score of the Quality of Life Index has been associated with significant  improvement in overall quality of life, self-image, physical symptoms, and general health in studies assessing change in quality of life.  PHQ-9: Recent Review Flowsheet Data    Depression screen [facility] 2/9 05/29/2018   Decreased Interest 0   Down, Depressed, Hopeless 0   PHQ - 2 Score 0     Interpretation of Total Score  Total Score Depression Severity:  1-4 = Minimal  depression, 5-9 = Mild depression, 10-14 = Moderate depression, 15-19 = Moderately severe depression, 20-27 = Severe depression   Psychosocial Evaluation and Intervention: Psychosocial Evaluation - 05/29/18 1705      Psychosocial Evaluation & Interventions   Interventions  Encouraged to exercise with the program and follow exercise prescription;Stress management education;Relaxation education    Comments  Pt with history of depression, stress, and anxiety.  No acute concerns.    Expected Outcomes  Zachary Ponce will report continued management of his depression, stress, and anxiety.  He will report the use of good coping skills.    Continue Psychosocial Services   Follow up required by staff       Psychosocial Re-Evaluation: Psychosocial Re-Evaluation    Row Name 06/08/18 1517 07/06/18 0746           Psychosocial Re-Evaluation   Current issues with  Current Anxiety/Panic;Current Stress Concerns  Current Anxiety/Panic;Current Stress Concerns      Comments  Chandlor previously struggled with not being able to golf or travel like he was once able to.  He has booked a trip and is seeing his ability with exercise.   Zachary Ponce had a reassuring OV regarding his ability to get back to golfing. He plans to resume golfing soon.       Expected Outcomes  Zachary Ponce will report decreased stress and anxiety with ability to manage both with good coping skills.   Zachary Ponce will report decreased stress and anxiety with ability to manage both with good coping skills.       Interventions  Stress management education;Relaxation education;Encouraged to attend Cardiac Rehabilitation for the exercise  Stress management education;Relaxation education;Encouraged to attend Cardiac Rehabilitation for the exercise      Continue Psychosocial Services   Follow up required by staff  Follow up required by staff         Psychosocial Discharge (Final Psychosocial Re-Evaluation): Psychosocial Re-Evaluation - 07/06/18 0746      Psychosocial  Re-Evaluation   Current issues with  Current Anxiety/Panic;Current Stress Concerns    Comments  Zachary Ponce had a reassuring OV regarding his ability to get back to golfing. He plans to resume golfing soon.     Expected Outcomes  Zachary Ponce will report decreased stress and anxiety with ability to manage both with good coping skills.     Interventions  Stress management education;Relaxation education;Encouraged to attend Cardiac Rehabilitation for the exercise    Continue Psychosocial Services   Follow up required by staff       Vocational Rehabilitation: Provide vocational rehab assistance to qualifying candidates.   Vocational Rehab Evaluation & Intervention: Vocational Rehab - 05/23/18 1411      Initial Vocational Rehab Evaluation & Intervention   Assessment shows need for Vocational Rehabilitation  No   retired       Education: Education Goals: Education classes will be provided on a weekly basis, covering required topics. Participant will state understanding/return demonstration of topics presented.  Learning Barriers/Preferences: Learning Barriers/Preferences - 05/23/18 1524      Learning Barriers/Preferences   Learning Barriers  Sight    Learning Preferences  Skilled Demonstration       Education Topics: Count Your Pulse:  -Group instruction provided by verbal instruction, demonstration, patient participation and written materials to support subject.  Instructors address importance of being able to find your pulse and how to count your pulse when at home without a heart monitor.  Patients get hands on experience counting their pulse with staff help and individually.   CARDIAC REHAB PHASE II EXERCISE from 06/30/2018 in [facility] CARDIAC REHAB  Date  06/30/18  Instruction Review Code  2- Demonstrated Understanding      Heart Attack, Angina, and Risk Factor Modification:  -Group instruction provided by verbal instruction, video, and written materials to support  subject.  Instructors address signs and symptoms of angina and heart attacks.    Also discuss risk factors for heart disease and how to make changes to improve heart health risk factors.   CARDIAC REHAB PHASE II EXERCISE from 06/30/2018 in [facility] CARDIAC REHAB  Date  06/07/18  Instruction Review Code  2- Demonstrated Understanding      Functional Fitness:  -Group instruction provided by verbal instruction, demonstration, patient participation, and written materials to support subject.  Instructors address safety measures for doing things around the house.  Discuss how to get up and down off the floor, how to pick things up properly, how to safely get out of a chair without assistance, and balance training.   Meditation and Mindfulness:  -Group instruction provided by verbal instruction, patient participation, and written materials to support subject.  Instructor addresses importance of mindfulness and meditation practice to help reduce stress and improve awareness.  Instructor also leads participants through a meditation exercise.    CARDIAC REHAB PHASE II EXERCISE from 06/30/2018 in [facility] CARDIAC REHAB  Date  06/28/18  Instruction Review Code  2- Demonstrated Understanding      Stretching for Flexibility and Mobility:  -Group instruction provided by verbal instruction, patient participation, and written materials to support subject.  Instructors lead participants through series of stretches that are designed to increase flexibility thus improving mobility.  These stretches are additional exercise for major muscle groups that are typically performed during regular warm up and cool down.   Hands Only CPR:  -Group verbal, video, and participation provides a basic overview of AHA guidelines for community CPR. Role-play of emergencies allow participants the opportunity to practice calling for help and chest compression technique with discussion of AED  use.   Hypertension: -Group verbal and written instruction that provides a basic overview of hypertension including the most recent diagnostic guidelines, risk factor reduction with self-care instructions and medication management.    Nutrition I class: Heart Healthy Eating:  -Group instruction provided by PowerPoint slides, verbal discussion, and written materials to support subject matter. The instructor gives an explanation and review of the Therapeutic Lifestyle Changes diet recommendations, which includes a discussion on lipid goals, dietary fat, sodium, fiber, plant stanol/sterol esters, sugar, and the components of a well-balanced, healthy diet.   Nutrition II class: Lifestyle Skills:  -Group instruction provided by PowerPoint slides, verbal discussion, and written materials to support subject matter. The instructor gives an explanation and review of label reading, grocery shopping for heart health, heart healthy recipe modifications, and ways to make healthier choices when eating out.   Diabetes Question & Answer:  -Group instruction provided by PowerPoint slides, verbal discussion, and written materials to support subject matter. The instructor gives an explanation and review of diabetes  co-morbidities, pre- and post-prandial blood glucose goals, pre-exercise blood glucose goals, signs, symptoms, and treatment of hypoglycemia and hyperglycemia, and foot care basics.   Diabetes Blitz:  -Group instruction provided by PowerPoint slides, verbal discussion, and written materials to support subject matter. The instructor gives an explanation and review of the physiology behind type 1 and type 2 diabetes, diabetes medications and rational behind using different medications, pre- and post-prandial blood glucose recommendations and Hemoglobin A1c goals, diabetes diet, and exercise including blood glucose guidelines for exercising safely.    Portion Distortion:  -Group instruction provided by  PowerPoint slides, verbal discussion, written materials, and food models to support subject matter. The instructor gives an explanation of serving size versus portion size, changes in portions sizes over the last 20 years, and what consists of a serving from each food group.   Stress Management:  -Group instruction provided by verbal instruction, video, and written materials to support subject matter.  Instructors review role of stress in heart disease and how to cope with stress positively.     Exercising on Your Own:  -Group instruction provided by verbal instruction, power point, and written materials to support subject.  Instructors discuss benefits of exercise, components of exercise, frequency and intensity of exercise, and end points for exercise.  Also discuss use of nitroglycerin and activating EMS.  Review options of places to exercise outside of rehab.  Review guidelines for sex with heart disease.   CARDIAC REHAB PHASE II EXERCISE from 06/30/2018 in [facility] CARDIAC REHAB  Date  06/21/18  Educator  EP  Instruction Review Code  2- Demonstrated Understanding      Cardiac Drugs I:  -Group instruction provided by verbal instruction and written materials to support subject.  Instructor reviews cardiac drug classes: antiplatelets, anticoagulants, beta blockers, and statins.  Instructor discusses reasons, side effects, and lifestyle considerations for each drug class.   Cardiac Drugs II:  -Group instruction provided by verbal instruction and written materials to support subject.  Instructor reviews cardiac drug classes: angiotensin converting enzyme inhibitors (ACE-I), angiotensin II receptor blockers (ARBs), nitrates, and calcium channel blockers.  Instructor discusses reasons, side effects, and lifestyle considerations for each drug class.   CARDIAC REHAB PHASE II EXERCISE from 06/30/2018 in [facility] CARDIAC REHAB  Date  06/14/18  Educator   Pharmacist  Instruction Review Code  2- Demonstrated Understanding      Anatomy and Physiology of the Circulatory System:  Group verbal and written instruction and models provide basic cardiac anatomy and physiology, with the coronary electrical and arterial systems. Review of: AMI, Angina, Valve disease, Heart Failure, Peripheral Artery Disease, Cardiac Arrhythmia, Pacemakers, and the ICD.   Other Education:  -Group or individual verbal, written, or video instructions that support the educational goals of the cardiac rehab program.   Holiday Eating Survival Tips:  -Group instruction provided by PowerPoint slides, verbal discussion, and written materials to support subject matter. The instructor gives patients tips, tricks, and techniques to help them not only survive but enjoy the holidays despite the onslaught of food that accompanies the holidays.   Knowledge Questionnaire Score: Knowledge Questionnaire Score - 05/23/18 1409      Knowledge Questionnaire Score   Pre Score  22/24       Core Components/Risk Factors/Patient Goals at Admission: Personal Goals and Risk Factors at Admission - 05/23/18 1419      Core Components/Risk Factors/Patient Goals on Admission    Weight Management  Weight Maintenance  Lipids  Yes    Intervention  Provide education and support for participant on nutrition & aerobic/resistive exercise along with prescribed medications to achieve LDL 70mg , HDL >40mg .    Expected Outcomes  Short Term: Participant states understanding of desired cholesterol values and is compliant with medications prescribed. Participant is following exercise prescription and nutrition guidelines.;Long Term: Cholesterol controlled with medications as prescribed, with individualized exercise RX and with personalized nutrition plan. Value goals: LDL < 70mg , HDL > 40 mg.    Stress  Yes    Intervention  Offer individual and/or small group education and counseling on adjustment to heart  disease, stress management and health-related lifestyle change. Teach and support self-help strategies.;Refer participants experiencing significant psychosocial distress to appropriate mental health specialists for further evaluation and treatment. When possible, include family members and significant others in education/counseling sessions.    Expected Outcomes  Short Term: Participant demonstrates changes in health-related behavior, relaxation and other stress management skills, ability to obtain effective social support, and compliance with psychotropic medications if prescribed.;Long Term: Emotional wellbeing is indicated by absence of clinically significant psychosocial distress or social isolation.       Core Components/Risk Factors/Patient Goals Review:  Goals and Risk Factor Review    Row Name 05/29/18 1708 07/06/18 0755           Core Components/Risk Factors/Patient Goals Review   Personal Goals Review  Weight Management/Obesity;Lipids;Stress  Weight Management/Obesity;Lipids;Stress      Review  Pt with multiple CAD RFs willing to participate in CR exercise.  Pt would like to be able to travel and golf.  Pt with multiple CAD RFs willing to participate in CR exercise.  Pt would like to be able to travel and golf.      Expected Outcomes  Pt will continue to participate in CR exercise, nutrition, and lifestyle modification opportunities.  Pt will continue to participate in CR exercise, nutrition, and lifestyle modification opportunities.         Core Components/Risk Factors/Patient Goals at Discharge (Final Review):  Goals and Risk Factor Review - 07/06/18 0755      Core Components/Risk Factors/Patient Goals Review   Personal Goals Review  Weight Management/Obesity;Lipids;Stress    Review  Pt with multiple CAD RFs willing to participate in CR exercise.  Pt would like to be able to travel and golf.    Expected Outcomes  Pt will continue to participate in CR exercise, nutrition, and  lifestyle modification opportunities.       ITP Comments: ITP Comments    Row Name 05/22/18 1700 05/23/18 1409 05/29/18 1705 07/06/18 0745     ITP Comments  Dr. Armanda Magic, Medical Director   Dr. Armanda Magic, Medical Director   30 Day ITP Review. Pt started cardiac rehab today and tolerated exercise well.   30 Day ITP Review. Pt is enjoying CR and the opportunity to gain an exercise routine. VSS.       Comments: See ITP Comments.

## 2018-07-07 ENCOUNTER — Encounter (HOSPITAL_COMMUNITY)
Admission: RE | Admit: 2018-07-07 | Discharge: 2018-07-07 | Disposition: A | Discharge status: Home / Self Care | Payer: Medicare Other | Source: Ambulatory Visit | Attending: Cardiology | Admitting: Cardiology

## 2018-07-07 ENCOUNTER — Encounter (HOSPITAL_COMMUNITY): Payer: Medicare Other

## 2018-07-07 DIAGNOSIS — Z7901 Long term (current) use of anticoagulants: Secondary | ICD-10-CM | POA: Insufficient documentation

## 2018-07-07 DIAGNOSIS — Z951 Presence of aortocoronary bypass graft: Secondary | ICD-10-CM | POA: Insufficient documentation

## 2018-07-07 DIAGNOSIS — F329 Major depressive disorder, single episode, unspecified: Secondary | ICD-10-CM | POA: Diagnosis not present

## 2018-07-07 DIAGNOSIS — I2510 Atherosclerotic heart disease of native coronary artery without angina pectoris: Secondary | ICD-10-CM | POA: Insufficient documentation

## 2018-07-07 DIAGNOSIS — F909 Attention-deficit hyperactivity disorder, unspecified type: Secondary | ICD-10-CM | POA: Insufficient documentation

## 2018-07-07 DIAGNOSIS — Z79899 Other long term (current) drug therapy: Secondary | ICD-10-CM | POA: Diagnosis not present

## 2018-07-07 DIAGNOSIS — E785 Hyperlipidemia, unspecified: Secondary | ICD-10-CM | POA: Diagnosis not present

## 2018-07-10 ENCOUNTER — Encounter (HOSPITAL_COMMUNITY): Payer: Medicare Other

## 2018-07-12 ENCOUNTER — Encounter (HOSPITAL_COMMUNITY)
Admission: RE | Admit: 2018-07-12 | Discharge: 2018-07-12 | Disposition: A | Discharge status: Home / Self Care | Payer: Medicare Other | Source: Ambulatory Visit | Attending: Pulmonary Disease | Admitting: Pulmonary Disease

## 2018-07-12 ENCOUNTER — Encounter (HOSPITAL_COMMUNITY): Payer: Medicare Other

## 2018-07-12 DIAGNOSIS — Z951 Presence of aortocoronary bypass graft: Secondary | ICD-10-CM

## 2018-07-12 DIAGNOSIS — I2510 Atherosclerotic heart disease of native coronary artery without angina pectoris: Secondary | ICD-10-CM | POA: Diagnosis not present

## 2018-07-14 ENCOUNTER — Other Ambulatory Visit: Payer: Self-pay | Admitting: Physician Assistant

## 2018-07-14 ENCOUNTER — Encounter (HOSPITAL_COMMUNITY): Payer: Medicare Other

## 2018-07-14 ENCOUNTER — Encounter (HOSPITAL_COMMUNITY)
Admission: RE | Admit: 2018-07-14 | Discharge: 2018-07-14 | Disposition: A | Discharge status: Home / Self Care | Payer: Medicare Other | Source: Ambulatory Visit | Attending: Pulmonary Disease | Admitting: Pulmonary Disease

## 2018-07-14 DIAGNOSIS — I2510 Atherosclerotic heart disease of native coronary artery without angina pectoris: Secondary | ICD-10-CM | POA: Diagnosis not present

## 2018-07-14 DIAGNOSIS — Z951 Presence of aortocoronary bypass graft: Secondary | ICD-10-CM

## 2018-07-17 ENCOUNTER — Encounter (HOSPITAL_COMMUNITY): Payer: Medicare Other

## 2018-07-17 ENCOUNTER — Encounter (HOSPITAL_COMMUNITY)
Admission: RE | Admit: 2018-07-17 | Discharge: 2018-07-17 | Disposition: A | Discharge status: Home / Self Care | Payer: Medicare Other | Source: Ambulatory Visit | Attending: Pulmonary Disease | Admitting: Pulmonary Disease

## 2018-07-17 DIAGNOSIS — Z951 Presence of aortocoronary bypass graft: Secondary | ICD-10-CM

## 2018-07-17 DIAGNOSIS — I2510 Atherosclerotic heart disease of native coronary artery without angina pectoris: Secondary | ICD-10-CM | POA: Diagnosis not present

## 2018-07-18 ENCOUNTER — Telehealth: Payer: Self-pay

## 2018-07-18 MED ORDER — ATORVASTATIN CALCIUM 20 MG PO TABS: 20.0000 mg | ORAL_TABLET | Freq: Every day | ORAL | 2 refills | Status: DC

## 2018-07-18 NOTE — Addendum Note (Signed)
Addended by: Lamona CurlOUTH, Scotlynn Noyes H on: 07/18/2018 11:24 AM   Modules accepted: Orders

## 2018-07-18 NOTE — Telephone Encounter (Signed)
Rx sent to pharmacy as requested.

## 2018-07-19 ENCOUNTER — Encounter (HOSPITAL_COMMUNITY): Payer: Medicare Other

## 2018-07-21 ENCOUNTER — Encounter (HOSPITAL_COMMUNITY): Payer: Medicare Other

## 2018-07-24 ENCOUNTER — Encounter (HOSPITAL_COMMUNITY): Payer: Medicare Other

## 2018-07-24 ENCOUNTER — Encounter (HOSPITAL_COMMUNITY)
Admission: RE | Admit: 2018-07-24 | Discharge: 2018-07-24 | Disposition: A | Discharge status: Home / Self Care | Payer: Medicare Other | Source: Ambulatory Visit | Attending: Pulmonary Disease | Admitting: Pulmonary Disease

## 2018-07-24 DIAGNOSIS — Z951 Presence of aortocoronary bypass graft: Secondary | ICD-10-CM

## 2018-07-24 DIAGNOSIS — I2510 Atherosclerotic heart disease of native coronary artery without angina pectoris: Secondary | ICD-10-CM | POA: Diagnosis not present

## 2018-07-25 ENCOUNTER — Ambulatory Visit: Payer: Medicare Other

## 2018-07-25 ENCOUNTER — Other Ambulatory Visit: Payer: Self-pay

## 2018-07-25 DIAGNOSIS — I480 Paroxysmal atrial fibrillation: Secondary | ICD-10-CM | POA: Diagnosis not present

## 2018-07-25 MED ORDER — APIXABAN 5 MG PO TABS: 5.0000 mg | ORAL_TABLET | Freq: Two times a day (BID) | ORAL | 2 refills | Status: DC

## 2018-07-25 NOTE — Telephone Encounter (Signed)
Patient in office today for ZIO patch and requested Eliquis 5mg  BID to be sent to pharmacy.   Refills sent as requested. Patient aware.

## 2018-07-26 ENCOUNTER — Encounter (HOSPITAL_COMMUNITY)
Admission: RE | Admit: 2018-07-26 | Discharge: 2018-07-26 | Disposition: A | Discharge status: Home / Self Care | Payer: Medicare Other | Source: Ambulatory Visit | Attending: Pulmonary Disease | Admitting: Pulmonary Disease

## 2018-07-26 ENCOUNTER — Encounter (HOSPITAL_COMMUNITY): Payer: Medicare Other

## 2018-07-26 DIAGNOSIS — I2510 Atherosclerotic heart disease of native coronary artery without angina pectoris: Secondary | ICD-10-CM | POA: Diagnosis not present

## 2018-07-26 DIAGNOSIS — Z951 Presence of aortocoronary bypass graft: Secondary | ICD-10-CM

## 2018-07-27 ENCOUNTER — Encounter (HOSPITAL_COMMUNITY): Payer: Self-pay

## 2018-07-27 NOTE — Progress Notes (Signed)
Cardiac Individual Treatment Plan  Patient Details  Name: Zachary Ponce MRN: [id number] Date of Birth: 02/07/1951 Referring Provider:     CARDIAC REHAB PHASE II ORIENTATION from 05/23/2018 in [place]  Referring Provider  Jacolyn Reedy, MD      Initial Encounter Date:    CARDIAC REHAB PHASE II ORIENTATION from 05/23/2018 in [place]  Date  05/23/18      Visit Diagnosis: 03/08/2018 S/P CABG (coronary artery bypass graft)  Patient's Home Medications on Admission:  Current Outpatient Medications:  .  apixaban (ELIQUIS) 5 MG TABS tablet, Take 1 tablet (5 mg total) by mouth 2 (two) times daily., Disp: 60 tablet, Rfl: 2 .  aspirin EC 81 MG EC tablet, Take 1 tablet (81 mg total) by mouth daily., Disp: , Rfl:  .  atorvastatin (LIPITOR) 20 MG tablet, Take 1 tablet (20 mg total) by mouth daily at 6 PM., Disp: 30 tablet, Rfl: 2 .  buPROPion (WELLBUTRIN XL) 300 MG 24 hr tablet, Take 300 mg by mouth daily., Disp: , Rfl:  .  escitalopram (LEXAPRO) 5 MG tablet, Take 5 mg by mouth daily., Disp: , Rfl: 0 .  NON FORMULARY, Apply 50 mg topically daily. Testosterone 71m/ml, Disp: , Rfl: 0 .  propranolol ER (INDERAL LA) 80 MG 24 hr capsule, Take 80 mg by mouth daily., Disp: , Rfl:  .  VYVANSE 30 MG capsule, Take 30 mg by mouth daily., Disp: , Rfl: 0  Past Medical History: Past Medical History:  Diagnosis Date  . ADHD (attention deficit hyperactivity disorder) 03/03/2018  . CAD (coronary artery disease) 03/03/2018   Cardiac cath  03/03/18 70% distal left main, 99% proximal LAD, 40-50% proximal circumflex, 60% intermediate, 90% ostial right coronary artery, 55% LVEF with some mild distal anterior hypokinesis  . Depression 06/30/2016  . Essential tremor   . History of traumatic head injury   . Hyperlipidemia 03/03/2018    Tobacco Use: Social History   Tobacco Use  Smoking Status Never Smoker  Smokeless Tobacco Never Used     Labs: Recent Review Flowsheet Data    Labs for ITP Cardiac and Pulmonary Rehab Latest Ref Rng & Units 03/08/2018 03/09/2018 03/10/2018 03/10/2018 06/22/2018   Cholestrol 100 - 199 mg/dL - - - - 108   LDLCALC 0 - 99 mg/dL - - - - 52   HDL >39 mg/dL - - - - 34(L)   Trlycerides 0 - 149 mg/dL - - - - 108   Hemoglobin A1c 4.8 - 5.6 % - - - - -   PHART 7.350 - 7.450 7.307(L) - - - -   PCO2ART 32.0 - 48.0 mmHg 44.7 - - - -   HCO3 20.0 - 28.0 mmol/L 22.6 - - - -   TCO2 22 - 32 mmol/L 24 22 24 24  -   ACIDBASEDEF 0.0 - 2.0 mmol/L 4.0(H) - - - -   O2SAT % 99.0 - - - -      Capillary Blood Glucose: Lab Results  Component Value Date   GLUCAP 133 (H) 03/09/2018   GLUCAP 125 (H) 03/09/2018   GLUCAP 114 (H) 03/09/2018   GLUCAP 119 (H) 03/09/2018   GLUCAP 150 (H) 03/08/2018     Exercise Target Goals: Exercise Program Goal: Individual exercise prescription set using results from initial 6 min walk test and THRR while considering  patient's activity barriers and safety.   Exercise Prescription Goal: Initial exercise prescription builds to 30-45  minutes a day of aerobic activity, 2-3 days per week.  Home exercise guidelines will be given to patient during program as part of exercise prescription that the participant will acknowledge.  Activity Barriers & Risk Stratification: Activity Barriers & Cardiac Risk Stratification - 05/23/18 1428      Activity Barriers & Cardiac Risk Stratification   Activity Barriers  None    Cardiac Risk Stratification  High       6 Minute Walk: 6 Minute Walk    Row Name 05/23/18 1400         6 Minute Walk   Phase  Initial     Distance  1757 feet     Walk Time  6 minutes     # of Rest Breaks  0     MPH  3.33     METS  3.94     RPE  9     Perceived Dyspnea   0     VO2 Peak  13.79     Symptoms  No     Resting HR  68 bpm     Resting BP  102/62     Resting Oxygen Saturation   98 %     Exercise Oxygen Saturation  during 6 min walk  99 %     Max Ex. HR   87 bpm     Max Ex. BP  122/68     2 Minute Post BP  104/72        Oxygen Initial Assessment:   Oxygen Re-Evaluation:   Oxygen Discharge (Final Oxygen Re-Evaluation):   Initial Exercise Prescription: Initial Exercise Prescription - 05/23/18 1400      Date of Initial Exercise RX and Referring Provider   Date  05/23/18    Referring Provider  Jacolyn Reedy, MD      Treadmill   MPH  2.6    Grade  1    Minutes  10    METs  3.35      Bike   Level  1.2    Minutes  10    METs  3.93      NuStep   Level  4    SPM  85    Minutes  10    METs  3      Prescription Details   Frequency (times per week)  3    Duration  Progress to 30 minutes of continuous aerobic without signs/symptoms of physical distress      Intensity   THRR 40-80% of Max Heartrate  61-122    Ratings of Perceived Exertion  11-13    Perceived Dyspnea  0-4      Progression   Progression  Continue to progress workloads to maintain intensity without signs/symptoms of physical distress.      Resistance Training   Training Prescription  Yes    Weight  3lbs    Reps  10-15       Perform Capillary Blood Glucose checks as needed.  Exercise Prescription Changes: Exercise Prescription Changes    Row Name 05/29/18 1600 06/07/18 0733 06/16/18 1028 07/03/18 0757 07/17/18 1600     Response to Exercise   Blood Pressure (Admit)  102/70  116/62  114/72  148/82  102/60   Blood Pressure (Exercise)  146/70  118/74  120/60  156/70  134/68   Blood Pressure (Exit)  104/62  104/60  102/64  104/60  104/60   Heart Rate (Admit)  63 bpm  69 bpm  74 bpm  63 bpm  63 bpm   Heart Rate (Exercise)  91 bpm  116 bpm  111 bpm  102 bpm  105 bpm   Heart Rate (Exit)  61 bpm  79 bpm  82 bpm  63 bpm  69 bpm   Rating of Perceived Exertion (Exercise)  12  12  13  12  11    Perceived Dyspnea (Exercise)  0  0  0  0  0   Symptoms  None  None  None  None  None   Comments  Pt's first day of exercise   None  None  None  None   Duration   Progress to 30 minutes of  aerobic without signs/symptoms of physical distress  Progress to 30 minutes of  aerobic without signs/symptoms of physical distress  Progress to 30 minutes of  aerobic without signs/symptoms of physical distress  Progress to 30 minutes of  aerobic without signs/symptoms of physical distress  Progress to 30 minutes of  aerobic without signs/symptoms of physical distress   Intensity  THRR unchanged  THRR unchanged  THRR unchanged  THRR unchanged  THRR unchanged     Progression   Progression  Continue to progress workloads to maintain intensity without signs/symptoms of physical distress.  Continue to progress workloads to maintain intensity without signs/symptoms of physical distress.  Continue to progress workloads to maintain intensity without signs/symptoms of physical distress.  Continue to progress workloads to maintain intensity without signs/symptoms of physical distress.  Continue to progress workloads to maintain intensity without signs/symptoms of physical distress.   Average METs  3.65  4.12  4.15  4.49  4.5     Resistance Training   Training Prescription  Yes  No  Yes  Yes  Yes   Weight  3lbs  -  4lbs  4lbs  5lbs   Reps  10-15  -  10-15  10-15  10-15   Time  10 Minutes  -  10 Minutes  10 Minutes  10 Minutes     Interval Training   Interval Training  No  No  No  No  No     Treadmill   MPH  2.6  3.4  3.4  3.6  3.6   Grade  1  2  2  2  2    Minutes  10  10  10  10  10    METs  3.35  4.54  4.54  4.75  4.75     Bike   Level  1.2  1.4  1.4  1.7  1.7   Minutes  10  10  10  10  10    METs  4  4.51  4.51  5.23  5.25     NuStep   Level  4  4  4  5  5    SPM  95  95  95  95  95   Minutes  10  10  10  10  10    METs  3.6  3.3  3.4  3.5  3.6     Home Exercise Plan   Plans to continue exercise at  -  -  Home (comment) Walking, Biking, Swimming  Home (comment) Walking, Biking, Swimming  Home (comment) Walking, Biking, Swimming   Frequency  -  -  Add 2 additional  days to program exercise sessions.  Add 2 additional days to program exercise sessions.  Add 2 additional days to program exercise sessions.   Initial  Home Exercises Provided  -  -  06/12/18  06/12/18  06/12/18   Row Name 07/26/18 1446             Response to Exercise   Blood Pressure (Admit)  104/70       Blood Pressure (Exercise)  116/68       Blood Pressure (Exit)  108/60       Heart Rate (Admit)  65 bpm       Heart Rate (Exercise)  114 bpm       Heart Rate (Exit)  64 bpm       Rating of Perceived Exertion (Exercise)  12       Perceived Dyspnea (Exercise)  0       Symptoms  None       Comments  None       Duration  Progress to 30 minutes of  aerobic without signs/symptoms of physical distress       Intensity  THRR unchanged         Progression   Progression  Continue to progress workloads to maintain intensity without signs/symptoms of physical distress.       Average METs  4.5         Resistance Training   Training Prescription  Yes       Weight  5lbs       Reps  10-15       Time  10 Minutes         Interval Training   Interval Training  No         Treadmill   MPH  3.6       Grade  2       Minutes  10       METs  4.75         Bike   Level  1.7       Minutes  10       METs  5.25         NuStep   Level  5       SPM  95       Minutes  10       METs  4.2         Home Exercise Plan   Plans to continue exercise at  Home (comment) Walking, Biking, Swimming       Frequency  Add 2 additional days to program exercise sessions.       Initial Home Exercises Provided  06/12/18          Exercise Comments: Exercise Comments    Row Name 05/29/18 1632 06/21/18 1427 07/27/18 1448       Exercise Comments  Today, was pt's first day of exercise. Pt responded well to exercise workloads. Will continue to monitor and progress pt as tolerated.   Reviewed HEP with pt. Pt verbalized understanding. Will continue to monitor and progress pt as tolerated.   Pt is responding well  to exercise prescriptions. Pt puts forth great effort with exercise. Will continue to monitor.         Exercise Goals and Review: Exercise Goals    Row Name 05/23/18 1429             Exercise Goals   Increase Physical Activity  Yes       Intervention  Provide advice, education, support and counseling about physical activity/exercise needs.;Develop an individualized exercise prescription for aerobic and resistive training based on initial evaluation findings,  risk stratification, comorbidities and participant's personal goals.       Expected Outcomes  Short Term: Attend rehab on a regular basis to increase amount of physical activity.;Long Term: Exercising regularly at least 3-5 days a week.;Long Term: Add in home exercise to make exercise part of routine and to increase amount of physical activity.       Increase Strength and Stamina  Yes       Intervention  Provide advice, education, support and counseling about physical activity/exercise needs.;Develop an individualized exercise prescription for aerobic and resistive training based on initial evaluation findings, risk stratification, comorbidities and participant's personal goals.       Expected Outcomes  Short Term: Increase workloads from initial exercise prescription for resistance, speed, and METs.;Short Term: Perform resistance training exercises routinely during rehab and add in resistance training at home;Long Term: Improve cardiorespiratory fitness, muscular endurance and strength as measured by increased METs and functional capacity ( )       Able to understand and use rate of perceived exertion (RPE) scale  Yes       Intervention  Provide education and explanation on how to use RPE scale       Expected Outcomes  Short Term: Able to use RPE daily in rehab to express subjective intensity level;Long Term:  Able to use RPE to guide intensity level when exercising independently       Knowledge and understanding of Target Heart Rate  Range (THRR)  Yes       Intervention  Provide education and explanation of THRR including how the numbers were predicted and where they are located for reference       Expected Outcomes  Short Term: Able to state/look up THRR;Long Term: Able to use THRR to govern intensity when exercising independently;Short Term: Able to use daily as guideline for intensity in rehab       Able to check pulse independently  Yes       Intervention  Provide education and demonstration on how to check pulse in carotid and radial arteries.;Review the importance of being able to check your own pulse for safety during independent exercise       Expected Outcomes  Short Term: Able to explain why pulse checking is important during independent exercise;Long Term: Able to check pulse independently and accurately       Understanding of Exercise Prescription  Yes       Intervention  Provide education, explanation, and written materials on patient's individual exercise prescription       Expected Outcomes  Short Term: Able to explain program exercise prescription;Long Term: Able to explain home exercise prescription to exercise independently          Exercise Goals Re-Evaluation : Exercise Goals Re-Evaluation    Row Name 06/21/18 1428 07/27/18 1450           Exercise Goal Re-Evaluation   Exercise Goals Review  Understanding of Exercise Prescription;Knowledge and understanding of Target Heart Rate Range (THRR);Able to understand and use rate of perceived exertion (RPE) scale;Increase Physical Activity;Increase Strength and Stamina;Able to check pulse independently  Increase Physical Activity;Understanding of Exercise Prescription      Comments  Reveiwed HEP with pt. Also reviewed THRR, RPE Scale, weather conditions, endpoints of exercise, NTG use, warmup and cool down.   Reviewed METs and Goals with pt. Will follow with pt regarding adding rower to prescription. Pt in increasing his strength and stamina.       Expected  Outcomes  Pt  will continue to walk daily. Pt has silversneakers and goes to local gym 2-3 days a week. Pt really wants to return back to swimming and golfing. Pt has follow up appointment with Dr. 06/22/2018. Pt is hopeful he will be cleared to return to those activities.   Pt has returned back to swimming and also been able to travel more. Pt has been able to return back to his normal acitivities. Will continue to monitor and progress pt as tolerated.          Discharge Exercise Prescription (Final Exercise Prescription Changes): Exercise Prescription Changes - 07/26/18 1446      Response to Exercise   Blood Pressure (Admit)  104/70    Blood Pressure (Exercise)  116/68    Blood Pressure (Exit)  108/60    Heart Rate (Admit)  65 bpm    Heart Rate (Exercise)  114 bpm    Heart Rate (Exit)  64 bpm    Rating of Perceived Exertion (Exercise)  12    Perceived Dyspnea (Exercise)  0    Symptoms  None    Comments  None    Duration  Progress to 30 minutes of  aerobic without signs/symptoms of physical distress    Intensity  THRR unchanged      Progression   Progression  Continue to progress workloads to maintain intensity without signs/symptoms of physical distress.    Average METs  4.5      Resistance Training   Training Prescription  Yes    Weight  5lbs    Reps  10-15    Time  10 Minutes      Interval Training   Interval Training  No      Treadmill   MPH  3.6    Grade  2    Minutes  10    METs  4.75      Bike   Level  1.7    Minutes  10    METs  5.25      NuStep   Level  5    SPM  95    Minutes  10    METs  4.2      Home Exercise Plan   Plans to continue exercise at  Home (comment)   Walking, Biking, Swimming   Frequency  Add 2 additional days to program exercise sessions.    Initial Home Exercises Provided  06/12/18       Nutrition:  Target Goals: Understanding of nutrition guidelines, daily intake of sodium 1500mg , cholesterol 200mg , calories 30% from fat and 7%  or less from saturated fats, daily to have 5 or more servings of fruits and vegetables.  Biometrics: Pre Biometrics - 05/23/18 1400      Pre Biometrics   Height  5\' 10"  (1.778 m)    Weight  76.2 kg    Waist Circumference  36 inches    Hip Circumference  39.25 inches    Waist to Hip Ratio  0.92 %    BMI (Calculated)  24.1    Triceps Skinfold  11 mm    % Body Fat  22.9 %    Grip Strength  34 kg    Flexibility  8 in    Single Leg Stand  29.06 seconds        Nutrition Therapy Plan and Nutrition Goals: Nutrition Therapy & Goals - 05/24/18 0802      Nutrition Therapy   Diet  heart healthy  Personal Nutrition Goals   Nutrition Goal  Pt to identify and limit food sources of saturated fat, trans fat, refined carbohydrates and sodium    Personal Goal #2  Pt to cut down on beef or pork high in saturated fat.    Personal Goal #3  Pt to eat more lean protein (fish, chicken, [place])    Personal Goal #4  Pt able to name foods that affect blood glucose.      Intervention Plan   Intervention  Prescribe, educate and counsel regarding individualized specific dietary modifications aiming towards targeted core components such as weight, hypertension, lipid management, diabetes, heart failure and other comorbidities.    Expected Outcomes  Short Term Goal: Understand basic principles of dietary content, such as calories, fat, sodium, cholesterol and nutrients.;Long Term Goal: Adherence to prescribed nutrition plan.       Nutrition Assessments: Nutrition Assessments - 05/24/18 0831      MEDFICTS Scores   Pre Score  74       Nutrition Goals Re-Evaluation:   Nutrition Goals Re-Evaluation:   Nutrition Goals Discharge (Final Nutrition Goals Re-Evaluation):   Psychosocial: Target Goals: Acknowledge presence or absence of significant depression and/or stress, maximize coping skills, provide positive support system. Participant is able to verbalize types and ability to use techniques  and skills needed for reducing stress and depression.  Initial Review & Psychosocial Screening: Initial Psych Review & Screening - 05/23/18 1409      Initial Review   Current issues with  History of Depression;Current Anxiety/Panic;Current Stress Concerns    Source of Stress Concerns  Chronic Illness    Comments  h/o stress and anxiety       Family Dynamics   Good Support System?  Yes   spouse, son      Barriers   Psychosocial barriers to participate in program  The patient should benefit from training in stress management and relaxation.      Screening Interventions   Interventions  Encouraged to exercise;To provide support and resources with identified psychosocial needs;Provide feedback about the scores to participant    Expected Outcomes  Short Term goal: Utilizing psychosocial counselor, staff and physician to assist with identification of specific Stressors or current issues interfering with healing process. Setting desired goal for each stressor or current issue identified.;Long Term Goal: Stressors or current issues are controlled or eliminated.;Short Term goal: Identification and review with participant of any Quality of Life or Depression concerns found by scoring the questionnaire.;Long Term goal: The participant improves quality of Life and PHQ9 Scores as seen by post scores and/or verbalization of changes       Quality of Life Scores: Quality of Life - 05/23/18 1418      Quality of Life   Select  Quality of Life      Quality of Life Scores   Health/Function Pre  15.63 %    Socioeconomic Pre  21.86 %    Psych/Spiritual Pre  12.5 %    Family Pre  17.7 %    GLOBAL Pre  16.57 %      Scores of 19 and below usually indicate a poorer quality of life in these areas.  A difference of  2-3 points is a clinically meaningful difference.  A difference of 2-3 points in the total score of the Quality of Life Index has been associated with significant improvement in overall quality  of life, self-image, physical symptoms, and general health in studies assessing change in quality of life.  PHQ-9: Recent Review Flowsheet Data    Depression screen [facility] 2/9 05/29/2018   Decreased Interest 0   Down, Depressed, Hopeless 0   PHQ - 2 Score 0     Interpretation of Total Score  Total Score Depression Severity:  1-4 = Minimal depression, 5-9 = Mild depression, 10-14 = Moderate depression, 15-19 = Moderately severe depression, 20-27 = Severe depression   Psychosocial Evaluation and Intervention: Psychosocial Evaluation - 05/29/18 1705      Psychosocial Evaluation & Interventions   Interventions  Encouraged to exercise with the program and follow exercise prescription;Stress management education;Relaxation education    Comments  Pt with history of depression, stress, and anxiety.  No acute concerns.    Expected Outcomes  Zachary Ponce will report continued management of his depression, stress, and anxiety.  He will report the use of good coping skills.    Continue Psychosocial Services   Follow up required by staff       Psychosocial Re-Evaluation: Psychosocial Re-Evaluation    Row Name 06/08/18 1517 07/06/18 0746 07/27/18 1207         Psychosocial Re-Evaluation   Current issues with  Current Anxiety/Panic;Current Stress Concerns  Current Anxiety/Panic;Current Stress Concerns  None Identified     Comments  Christine previously struggled with not being able to golf or travel like he was once able to.  He has booked a trip and is seeing his ability with exercise.   Zachary Ponce had a reassuring OV regarding his ability to get back to golfing. He plans to resume golfing soon.   Zachary Ponce has been able to travel again.      Expected Outcomes  Zachary Ponce will report decreased stress and anxiety with ability to manage both with good coping skills.   Zachary Ponce will report decreased stress and anxiety with ability to manage both with good coping skills.   Zachary Ponce will continue to report ability to use his coping skills  to manage stress.      Interventions  Stress management education;Relaxation education;Encouraged to attend Cardiac Rehabilitation for the exercise  Stress management education;Relaxation education;Encouraged to attend Cardiac Rehabilitation for the exercise  Stress management education;Relaxation education;Encouraged to attend Cardiac Rehabilitation for the exercise     Continue Psychosocial Services   Follow up required by staff  Follow up required by staff  No Follow up required        Psychosocial Discharge (Final Psychosocial Re-Evaluation): Psychosocial Re-Evaluation - 07/27/18 1207      Psychosocial Re-Evaluation   Current issues with  None Identified    Comments  Zachary Ponce has been able to travel again.     Expected Outcomes  Zachary Ponce will continue to report ability to use his coping skills to manage stress.     Interventions  Stress management education;Relaxation education;Encouraged to attend Cardiac Rehabilitation for the exercise    Continue Psychosocial Services   No Follow up required       Vocational Rehabilitation: Provide vocational rehab assistance to qualifying candidates.   Vocational Rehab Evaluation & Intervention: Vocational Rehab - 05/23/18 1411      Initial Vocational Rehab Evaluation & Intervention   Assessment shows need for Vocational Rehabilitation  No   retired       Education: Education Goals: Education classes will be provided on a weekly basis, covering required topics. Participant will state understanding/return demonstration of topics presented.  Learning Barriers/Preferences: Learning Barriers/Preferences - 05/23/18 1524      Learning Barriers/Preferences   Learning Barriers  Sight    Learning  Preferences  Skilled Demonstration       Education Topics: Count Your Pulse:  -Group instruction provided by verbal instruction, demonstration, patient participation and written materials to support subject.  Instructors address importance of being able to  find your pulse and how to count your pulse when at home without a heart monitor.  Patients get hands on experience counting their pulse with staff help and individually.   CARDIAC REHAB PHASE II EXERCISE from 07/12/2018 in [facility] CARDIAC REHAB  Date  06/30/18  Instruction Review Code  2- Demonstrated Understanding      Heart Attack, Angina, and Risk Factor Modification:  -Group instruction provided by verbal instruction, video, and written materials to support subject.  Instructors address signs and symptoms of angina and heart attacks.    Also discuss risk factors for heart disease and how to make changes to improve heart health risk factors.   CARDIAC REHAB PHASE II EXERCISE from 07/12/2018 in [facility] CARDIAC REHAB  Date  06/07/18  Instruction Review Code  2- Demonstrated Understanding      Functional Fitness:  -Group instruction provided by verbal instruction, demonstration, patient participation, and written materials to support subject.  Instructors address safety measures for doing things around the house.  Discuss how to get up and down off the floor, how to pick things up properly, how to safely get out of a chair without assistance, and balance training.   Meditation and Mindfulness:  -Group instruction provided by verbal instruction, patient participation, and written materials to support subject.  Instructor addresses importance of mindfulness and meditation practice to help reduce stress and improve awareness.  Instructor also leads participants through a meditation exercise.    CARDIAC REHAB PHASE II EXERCISE from 07/12/2018 in [facility] CARDIAC REHAB  Date  06/28/18  Instruction Review Code  2- Demonstrated Understanding      Stretching for Flexibility and Mobility:  -Group instruction provided by verbal instruction, patient participation, and written materials to support subject.  Instructors lead participants  through series of stretches that are designed to increase flexibility thus improving mobility.  These stretches are additional exercise for major muscle groups that are typically performed during regular warm up and cool down.   Hands Only CPR:  -Group verbal, video, and participation provides a basic overview of AHA guidelines for community CPR. Role-play of emergencies allow participants the opportunity to practice calling for help and chest compression technique with discussion of AED use.   Hypertension: -Group verbal and written instruction that provides a basic overview of hypertension including the most recent diagnostic guidelines, risk factor reduction with self-care instructions and medication management.    Nutrition I class: Heart Healthy Eating:  -Group instruction provided by PowerPoint slides, verbal discussion, and written materials to support subject matter. The instructor gives an explanation and review of the Therapeutic Lifestyle Changes diet recommendations, which includes a discussion on lipid goals, dietary fat, sodium, fiber, plant stanol/sterol esters, sugar, and the components of a well-balanced, healthy diet.   Nutrition II class: Lifestyle Skills:  -Group instruction provided by PowerPoint slides, verbal discussion, and written materials to support subject matter. The instructor gives an explanation and review of label reading, grocery shopping for heart health, heart healthy recipe modifications, and ways to make healthier choices when eating out.   Diabetes Question & Answer:  -Group instruction provided by PowerPoint slides, verbal discussion, and written materials to support subject matter. The instructor gives an explanation and review  of diabetes co-morbidities, pre- and post-prandial blood glucose goals, pre-exercise blood glucose goals, signs, symptoms, and treatment of hypoglycemia and hyperglycemia, and foot care basics.   Diabetes Blitz:  -Group  instruction provided by PowerPoint slides, verbal discussion, and written materials to support subject matter. The instructor gives an explanation and review of the physiology behind type 1 and type 2 diabetes, diabetes medications and rational behind using different medications, pre- and post-prandial blood glucose recommendations and Hemoglobin A1c goals, diabetes diet, and exercise including blood glucose guidelines for exercising safely.    Portion Distortion:  -Group instruction provided by PowerPoint slides, verbal discussion, written materials, and food models to support subject matter. The instructor gives an explanation of serving size versus portion size, changes in portions sizes over the last 20 years, and what consists of a serving from each food group.   Stress Management:  -Group instruction provided by verbal instruction, video, and written materials to support subject matter.  Instructors review role of stress in heart disease and how to cope with stress positively.     CARDIAC REHAB PHASE II EXERCISE from 07/12/2018 in [facility] CARDIAC REHAB  Date  07/12/18  Instruction Review Code  2- Demonstrated Understanding      Exercising on Your Own:  -Group instruction provided by verbal instruction, power point, and written materials to support subject.  Instructors discuss benefits of exercise, components of exercise, frequency and intensity of exercise, and end points for exercise.  Also discuss use of nitroglycerin and activating EMS.  Review options of places to exercise outside of rehab.  Review guidelines for sex with heart disease.   CARDIAC REHAB PHASE II EXERCISE from 07/12/2018 in [facility] CARDIAC REHAB  Date  06/21/18  Educator  EP  Instruction Review Code  2- Demonstrated Understanding      Cardiac Drugs I:  -Group instruction provided by verbal instruction and written materials to support subject.  Instructor reviews cardiac drug  classes: antiplatelets, anticoagulants, beta blockers, and statins.  Instructor discusses reasons, side effects, and lifestyle considerations for each drug class.   Cardiac Drugs II:  -Group instruction provided by verbal instruction and written materials to support subject.  Instructor reviews cardiac drug classes: angiotensin converting enzyme inhibitors (ACE-I), angiotensin II receptor blockers (ARBs), nitrates, and calcium channel blockers.  Instructor discusses reasons, side effects, and lifestyle considerations for each drug class.   CARDIAC REHAB PHASE II EXERCISE from 07/12/2018 in [facility] CARDIAC REHAB  Date  06/14/18  Educator  Pharmacist  Instruction Review Code  2- Demonstrated Understanding      Anatomy and Physiology of the Circulatory System:  Group verbal and written instruction and models provide basic cardiac anatomy and physiology, with the coronary electrical and arterial systems. Review of: AMI, Angina, Valve disease, Heart Failure, Peripheral Artery Disease, Cardiac Arrhythmia, Pacemakers, and the ICD.   Other Education:  -Group or individual verbal, written, or video instructions that support the educational goals of the cardiac rehab program.   Holiday Eating Survival Tips:  -Group instruction provided by PowerPoint slides, verbal discussion, and written materials to support subject matter. The instructor gives patients tips, tricks, and techniques to help them not only survive but enjoy the holidays despite the onslaught of food that accompanies the holidays.   Knowledge Questionnaire Score: Knowledge Questionnaire Score - 05/23/18 1409      Knowledge Questionnaire Score   Pre Score  22/24       Core Components/Risk Factors/Patient Goals  at Admission: Personal Goals and Risk Factors at Admission - 05/23/18 1419      Core Components/Risk Factors/Patient Goals on Admission    Weight Management  Weight Maintenance    Lipids  Yes     Intervention  Provide education and support for participant on nutrition & aerobic/resistive exercise along with prescribed medications to achieve LDL 70mg , HDL >40mg .    Expected Outcomes  Short Term: Participant states understanding of desired cholesterol values and is compliant with medications prescribed. Participant is following exercise prescription and nutrition guidelines.;Long Term: Cholesterol controlled with medications as prescribed, with individualized exercise RX and with personalized nutrition plan. Value goals: LDL < 70mg , HDL > 40 mg.    Stress  Yes    Intervention  Offer individual and/or small group education and counseling on adjustment to heart disease, stress management and health-related lifestyle change. Teach and support self-help strategies.;Refer participants experiencing significant psychosocial distress to appropriate mental health specialists for further evaluation and treatment. When possible, include family members and significant others in education/counseling sessions.    Expected Outcomes  Short Term: Participant demonstrates changes in health-related behavior, relaxation and other stress management skills, ability to obtain effective social support, and compliance with psychotropic medications if prescribed.;Long Term: Emotional wellbeing is indicated by absence of clinically significant psychosocial distress or social isolation.       Core Components/Risk Factors/Patient Goals Review:  Goals and Risk Factor Review    Row Name 05/29/18 1708 07/06/18 0755 07/27/18 1211         Core Components/Risk Factors/Patient Goals Review   Personal Goals Review  Weight Management/Obesity;Lipids;Stress  Weight Management/Obesity;Lipids;Stress  Weight Management/Obesity;Lipids;Stress     Review  Pt with multiple CAD RFs willing to participate in CR exercise.  Pt would like to be able to travel and golf.  Pt with multiple CAD RFs willing to participate in CR exercise.  Pt would  like to be able to travel and golf.  Pt with multiple CAD RFs willing to participate in CR exercise. Zachary Ponce has been able to travel.  He is tolerating exercise well.      Expected Outcomes  Pt will continue to participate in CR exercise, nutrition, and lifestyle modification opportunities.  Pt will continue to participate in CR exercise, nutrition, and lifestyle modification opportunities.  Pt will continue to participate in CR exercise, nutrition, and lifestyle modification opportunities.        Core Components/Risk Factors/Patient Goals at Discharge (Final Review):  Goals and Risk Factor Review - 07/27/18 1211      Core Components/Risk Factors/Patient Goals Review   Personal Goals Review  Weight Management/Obesity;Lipids;Stress    Review  Pt with multiple CAD RFs willing to participate in CR exercise. Zachary Ponce has been able to travel.  He is tolerating exercise well.     Expected Outcomes  Pt will continue to participate in CR exercise, nutrition, and lifestyle modification opportunities.       ITP Comments: ITP Comments    Row Name 05/22/18 1700 05/23/18 1409 05/29/18 1705 07/06/18 0745 07/27/18 1153   ITP Comments  Dr. Armanda Magic, Medical Director   Dr. Armanda Magic, Medical Director   30 Day ITP Review. Pt started cardiac rehab today and tolerated exercise well.   30 Day ITP Review. Pt is enjoying CR and the opportunity to gain an exercise routine. VSS.  30 Day ITP Review. Zachary Ponce continues to tolerate exercise well.  He has been able to travel.       Comments: See ITP  Comments.

## 2018-07-28 ENCOUNTER — Encounter (HOSPITAL_COMMUNITY)
Admission: RE | Admit: 2018-07-28 | Discharge: 2018-07-28 | Disposition: A | Discharge status: Home / Self Care | Payer: Medicare Other | Source: Ambulatory Visit | Attending: Pulmonary Disease | Admitting: Pulmonary Disease

## 2018-07-28 ENCOUNTER — Encounter (HOSPITAL_COMMUNITY): Payer: Medicare Other

## 2018-07-28 DIAGNOSIS — Z951 Presence of aortocoronary bypass graft: Secondary | ICD-10-CM

## 2018-07-28 DIAGNOSIS — I2510 Atherosclerotic heart disease of native coronary artery without angina pectoris: Secondary | ICD-10-CM | POA: Diagnosis not present

## 2018-07-31 ENCOUNTER — Encounter (HOSPITAL_COMMUNITY)
Admission: RE | Admit: 2018-07-31 | Discharge: 2018-07-31 | Disposition: A | Discharge status: Home / Self Care | Payer: Medicare Other | Source: Ambulatory Visit | Attending: Pulmonary Disease | Admitting: Pulmonary Disease

## 2018-07-31 ENCOUNTER — Encounter (HOSPITAL_COMMUNITY): Payer: Medicare Other

## 2018-07-31 DIAGNOSIS — Z951 Presence of aortocoronary bypass graft: Secondary | ICD-10-CM

## 2018-07-31 DIAGNOSIS — I2510 Atherosclerotic heart disease of native coronary artery without angina pectoris: Secondary | ICD-10-CM | POA: Diagnosis not present

## 2018-08-02 ENCOUNTER — Encounter (HOSPITAL_COMMUNITY): Payer: Medicare Other

## 2018-08-02 ENCOUNTER — Encounter (HOSPITAL_COMMUNITY)
Admission: RE | Admit: 2018-08-02 | Discharge: 2018-08-02 | Disposition: A | Discharge status: Home / Self Care | Payer: Medicare Other | Source: Ambulatory Visit | Attending: Pulmonary Disease | Admitting: Pulmonary Disease

## 2018-08-02 DIAGNOSIS — I2510 Atherosclerotic heart disease of native coronary artery without angina pectoris: Secondary | ICD-10-CM | POA: Diagnosis not present

## 2018-08-02 DIAGNOSIS — Z951 Presence of aortocoronary bypass graft: Secondary | ICD-10-CM

## 2018-08-04 ENCOUNTER — Encounter (HOSPITAL_COMMUNITY): Payer: Medicare Other

## 2018-08-07 ENCOUNTER — Encounter (HOSPITAL_COMMUNITY)
Admission: RE | Admit: 2018-08-07 | Discharge: 2018-08-07 | Disposition: A | Discharge status: Home / Self Care | Payer: Medicare Other | Source: Ambulatory Visit | Attending: Pulmonary Disease | Admitting: Pulmonary Disease

## 2018-08-07 ENCOUNTER — Encounter (HOSPITAL_COMMUNITY): Payer: Medicare Other

## 2018-08-07 DIAGNOSIS — E785 Hyperlipidemia, unspecified: Secondary | ICD-10-CM | POA: Diagnosis not present

## 2018-08-07 DIAGNOSIS — I2510 Atherosclerotic heart disease of native coronary artery without angina pectoris: Secondary | ICD-10-CM | POA: Insufficient documentation

## 2018-08-07 DIAGNOSIS — Z79899 Other long term (current) drug therapy: Secondary | ICD-10-CM | POA: Insufficient documentation

## 2018-08-07 DIAGNOSIS — Z951 Presence of aortocoronary bypass graft: Secondary | ICD-10-CM | POA: Insufficient documentation

## 2018-08-07 DIAGNOSIS — Z7901 Long term (current) use of anticoagulants: Secondary | ICD-10-CM | POA: Diagnosis not present

## 2018-08-07 DIAGNOSIS — F909 Attention-deficit hyperactivity disorder, unspecified type: Secondary | ICD-10-CM | POA: Insufficient documentation

## 2018-08-07 DIAGNOSIS — F329 Major depressive disorder, single episode, unspecified: Secondary | ICD-10-CM | POA: Diagnosis not present

## 2018-08-09 ENCOUNTER — Encounter (HOSPITAL_COMMUNITY): Payer: Medicare Other

## 2018-08-09 ENCOUNTER — Encounter (HOSPITAL_COMMUNITY)
Admission: RE | Admit: 2018-08-09 | Discharge: 2018-08-09 | Disposition: A | Discharge status: Home / Self Care | Payer: Medicare Other | Source: Ambulatory Visit | Attending: Pulmonary Disease | Admitting: Pulmonary Disease

## 2018-08-09 DIAGNOSIS — Z951 Presence of aortocoronary bypass graft: Secondary | ICD-10-CM

## 2018-08-09 DIAGNOSIS — I2510 Atherosclerotic heart disease of native coronary artery without angina pectoris: Secondary | ICD-10-CM | POA: Diagnosis not present

## 2018-08-11 ENCOUNTER — Encounter (HOSPITAL_COMMUNITY): Payer: Medicare Other

## 2018-08-11 ENCOUNTER — Encounter (HOSPITAL_COMMUNITY)
Admission: RE | Admit: 2018-08-11 | Discharge: 2018-08-11 | Disposition: A | Discharge status: Home / Self Care | Payer: Medicare Other | Source: Ambulatory Visit | Attending: Pulmonary Disease | Admitting: Pulmonary Disease

## 2018-08-11 DIAGNOSIS — I2510 Atherosclerotic heart disease of native coronary artery without angina pectoris: Secondary | ICD-10-CM | POA: Diagnosis not present

## 2018-08-11 DIAGNOSIS — Z951 Presence of aortocoronary bypass graft: Secondary | ICD-10-CM

## 2018-08-14 ENCOUNTER — Encounter (HOSPITAL_COMMUNITY)
Admission: RE | Admit: 2018-08-14 | Discharge: 2018-08-14 | Disposition: A | Discharge status: Home / Self Care | Payer: Medicare Other | Source: Ambulatory Visit | Attending: Pulmonary Disease | Admitting: Pulmonary Disease

## 2018-08-14 ENCOUNTER — Encounter (HOSPITAL_COMMUNITY): Payer: Medicare Other

## 2018-08-14 DIAGNOSIS — Z951 Presence of aortocoronary bypass graft: Secondary | ICD-10-CM

## 2018-08-14 DIAGNOSIS — I2510 Atherosclerotic heart disease of native coronary artery without angina pectoris: Secondary | ICD-10-CM | POA: Diagnosis not present

## 2018-08-16 ENCOUNTER — Encounter (HOSPITAL_COMMUNITY)
Admission: RE | Admit: 2018-08-16 | Discharge: 2018-08-16 | Disposition: A | Discharge status: Home / Self Care | Payer: Medicare Other | Source: Ambulatory Visit | Attending: Pulmonary Disease | Admitting: Pulmonary Disease

## 2018-08-16 ENCOUNTER — Encounter (HOSPITAL_COMMUNITY): Payer: Medicare Other

## 2018-08-16 DIAGNOSIS — Z951 Presence of aortocoronary bypass graft: Secondary | ICD-10-CM

## 2018-08-16 DIAGNOSIS — I2510 Atherosclerotic heart disease of native coronary artery without angina pectoris: Secondary | ICD-10-CM | POA: Diagnosis not present

## 2018-08-17 ENCOUNTER — Encounter (HOSPITAL_COMMUNITY): Payer: Self-pay

## 2018-08-17 NOTE — Progress Notes (Signed)
Cardiac Individual Treatment Plan  Patient Details  Name: Zachary Ponce MRN: [id number] Date of Birth: 02/07/1951 Referring Provider:     CARDIAC REHAB PHASE II ORIENTATION from 05/23/2018 in [facility]  Referring Provider  Jacolyn Reedy, MD      Initial Encounter Date:    CARDIAC REHAB PHASE II ORIENTATION from 05/23/2018 in [facility]  Date  05/23/18      Visit Diagnosis: 03/08/2018 S/P CABG (coronary artery bypass graft)  Patient's Home Medications on Admission:  Current Outpatient Medications:  .  apixaban (ELIQUIS) 5 MG TABS tablet, Take 1 tablet (5 mg total) by mouth 2 (two) times daily., Disp: 60 tablet, Rfl: 2 .  aspirin EC 81 MG EC tablet, Take 1 tablet (81 mg total) by mouth daily., Disp: , Rfl:  .  atorvastatin (LIPITOR) 20 MG tablet, Take 1 tablet (20 mg total) by mouth daily at 6 PM., Disp: 30 tablet, Rfl: 2 .  buPROPion (WELLBUTRIN XL) 300 MG 24 hr tablet, Take 300 mg by mouth daily., Disp: , Rfl:  .  escitalopram (LEXAPRO) 5 MG tablet, Take 5 mg by mouth daily., Disp: , Rfl: 0 .  NON FORMULARY, Apply 50 mg topically daily. Testosterone 71m/ml, Disp: , Rfl: 0 .  propranolol ER (INDERAL LA) 80 MG 24 hr capsule, Take 80 mg by mouth daily., Disp: , Rfl:  .  VYVANSE 30 MG capsule, Take 30 mg by mouth daily., Disp: , Rfl: 0  Past Medical History: Past Medical History:  Diagnosis Date  . ADHD (attention deficit hyperactivity disorder) 03/03/2018  . CAD (coronary artery disease) 03/03/2018   Cardiac cath  03/03/18 70% distal left main, 99% proximal LAD, 40-50% proximal circumflex, 60% intermediate, 90% ostial right coronary artery, 55% LVEF with some mild distal anterior hypokinesis  . Depression 06/30/2016  . Essential tremor   . History of traumatic head injury   . Hyperlipidemia 03/03/2018    Tobacco Use: Social History   Tobacco Use  Smoking Status Never Smoker  Smokeless Tobacco Never Used     Labs: Recent Review Flowsheet Data    Labs for ITP Cardiac and Pulmonary Rehab Latest Ref Rng & Units 03/08/2018 03/09/2018 03/10/2018 03/10/2018 06/22/2018   Cholestrol 100 - 199 mg/dL - - - - 108   LDLCALC 0 - 99 mg/dL - - - - 52   HDL >39 mg/dL - - - - 34(L)   Trlycerides 0 - 149 mg/dL - - - - 108   Hemoglobin A1c 4.8 - 5.6 % - - - - -   PHART 7.350 - 7.450 7.307(L) - - - -   PCO2ART 32.0 - 48.0 mmHg 44.7 - - - -   HCO3 20.0 - 28.0 mmol/L 22.6 - - - -   TCO2 22 - 32 mmol/L 24 22 24 24  -   ACIDBASEDEF 0.0 - 2.0 mmol/L 4.0(H) - - - -   O2SAT % 99.0 - - - -      Capillary Blood Glucose: Lab Results  Component Value Date   GLUCAP 133 (H) 03/09/2018   GLUCAP 125 (H) 03/09/2018   GLUCAP 114 (H) 03/09/2018   GLUCAP 119 (H) 03/09/2018   GLUCAP 150 (H) 03/08/2018     Exercise Target Goals: Exercise Program Goal: Individual exercise prescription set using results from initial 6 min walk test and THRR while considering  patient's activity barriers and safety.   Exercise Prescription Goal: Initial exercise prescription builds to 30-45  minutes a day of aerobic activity, 2-3 days per week.  Home exercise guidelines will be given to patient during program as part of exercise prescription that the participant will acknowledge.  Activity Barriers & Risk Stratification: Activity Barriers & Cardiac Risk Stratification - 05/23/18 1428      Activity Barriers & Cardiac Risk Stratification   Activity Barriers  None    Cardiac Risk Stratification  High       6 Minute Walk: 6 Minute Walk    Row Name 05/23/18 1400         6 Minute Walk   Phase  Initial     Distance  1757 feet     Walk Time  6 minutes     # of Rest Breaks  0     MPH  3.33     METS  3.94     RPE  9     Perceived Dyspnea   0     VO2 Peak  13.79     Symptoms  No     Resting HR  68 bpm     Resting BP  102/62     Resting Oxygen Saturation   98 %     Exercise Oxygen Saturation  during 6 min walk  99 %     Max Ex. HR   87 bpm     Max Ex. BP  122/68     2 Minute Post BP  104/72        Oxygen Initial Assessment:   Oxygen Re-Evaluation:   Oxygen Discharge (Final Oxygen Re-Evaluation):   Initial Exercise Prescription: Initial Exercise Prescription - 05/23/18 1400      Date of Initial Exercise RX and Referring Provider   Date  05/23/18    Referring Provider  Jacolyn Reedy, MD      Treadmill   MPH  2.6    Grade  1    Minutes  10    METs  3.35      Bike   Level  1.2    Minutes  10    METs  3.93      NuStep   Level  4    SPM  85    Minutes  10    METs  3      Prescription Details   Frequency (times per week)  3    Duration  Progress to 30 minutes of continuous aerobic without signs/symptoms of physical distress      Intensity   THRR 40-80% of Max Heartrate  61-122    Ratings of Perceived Exertion  11-13    Perceived Dyspnea  0-4      Progression   Progression  Continue to progress workloads to maintain intensity without signs/symptoms of physical distress.      Resistance Training   Training Prescription  Yes    Weight  3lbs    Reps  10-15       Perform Capillary Blood Glucose checks as needed.  Exercise Prescription Changes: Exercise Prescription Changes    Row Name 05/29/18 1600 06/07/18 0733 06/16/18 1028 07/03/18 0757 07/17/18 1600     Response to Exercise   Blood Pressure (Admit)  102/70  116/62  114/72  148/82  102/60   Blood Pressure (Exercise)  146/70  118/74  120/60  156/70  134/68   Blood Pressure (Exit)  104/62  104/60  102/64  104/60  104/60   Heart Rate (Admit)  63 bpm  69 bpm  74 bpm  63 bpm  63 bpm   Heart Rate (Exercise)  91 bpm  116 bpm  111 bpm  102 bpm  105 bpm   Heart Rate (Exit)  61 bpm  79 bpm  82 bpm  63 bpm  69 bpm   Rating of Perceived Exertion (Exercise)  12  12  13  12  11    Perceived Dyspnea (Exercise)  0  0  0  0  0   Symptoms  None  None  None  None  None   Comments  Pt's first day of exercise   None  None  None  None   Duration   Progress to 30 minutes of  aerobic without signs/symptoms of physical distress  Progress to 30 minutes of  aerobic without signs/symptoms of physical distress  Progress to 30 minutes of  aerobic without signs/symptoms of physical distress  Progress to 30 minutes of  aerobic without signs/symptoms of physical distress  Progress to 30 minutes of  aerobic without signs/symptoms of physical distress   Intensity  THRR unchanged  THRR unchanged  THRR unchanged  THRR unchanged  THRR unchanged     Progression   Progression  Continue to progress workloads to maintain intensity without signs/symptoms of physical distress.  Continue to progress workloads to maintain intensity without signs/symptoms of physical distress.  Continue to progress workloads to maintain intensity without signs/symptoms of physical distress.  Continue to progress workloads to maintain intensity without signs/symptoms of physical distress.  Continue to progress workloads to maintain intensity without signs/symptoms of physical distress.   Average METs  3.65  4.12  4.15  4.49  4.5     Resistance Training   Training Prescription  Yes  No  Yes  Yes  Yes   Weight  3lbs  -  4lbs  4lbs  5lbs   Reps  10-15  -  10-15  10-15  10-15   Time  10 Minutes  -  10 Minutes  10 Minutes  10 Minutes     Interval Training   Interval Training  No  No  No  No  No     Treadmill   MPH  2.6  3.4  3.4  3.6  3.6   Grade  1  2  2  2  2    Minutes  10  10  10  10  10    METs  3.35  4.54  4.54  4.75  4.75     Bike   Level  1.2  1.4  1.4  1.7  1.7   Minutes  10  10  10  10  10    METs  4  4.51  4.51  5.23  5.25     NuStep   Level  4  4  4  5  5    SPM  95  95  95  95  95   Minutes  10  10  10  10  10    METs  3.6  3.3  3.4  3.5  3.6     Home Exercise Plan   Plans to continue exercise at  -  -  Home (comment) Walking, Biking, Swimming  Home (comment) Walking, Biking, Swimming  Home (comment) Walking, Biking, Swimming   Frequency  -  -  Add 2 additional  days to program exercise sessions.  Add 2 additional days to program exercise sessions.  Add 2 additional days to program exercise sessions.   Initial  Home Exercises Provided  -  -  06/12/18  06/12/18  06/12/18   Row Name 07/26/18 1446 08/02/18 1039 08/07/18 1426 08/14/18 1700       Response to Exercise   Blood Pressure (Admit)  104/70  118/60  104/70  120/70    Blood Pressure (Exercise)  116/68  122/78  120/74  128/78    Blood Pressure (Exit)  108/60  104/58  108/58  104/60    Heart Rate (Admit)  65 bpm  72 bpm  63 bpm  67 bpm    Heart Rate (Exercise)  114 bpm  111 bpm  106 bpm  120 bpm    Heart Rate (Exit)  64 bpm  76 bpm  66 bpm  67 bpm    Rating of Perceived Exertion (Exercise)  12  12  12  13     Perceived Dyspnea (Exercise)  0  0  0  0    Symptoms  None  None  None  None    Comments  None  None  None  None    Duration  Progress to 30 minutes of  aerobic without signs/symptoms of physical distress  Progress to 30 minutes of  aerobic without signs/symptoms of physical distress  Progress to 30 minutes of  aerobic without signs/symptoms of physical distress  Continue with 30 min of aerobic exercise without signs/symptoms of physical distress.    Intensity  THRR unchanged  THRR unchanged  THRR unchanged  THRR unchanged      Progression   Progression  Continue to progress workloads to maintain intensity without signs/symptoms of physical distress.  Continue to progress workloads to maintain intensity without signs/symptoms of physical distress.  Continue to progress workloads to maintain intensity without signs/symptoms of physical distress.  Continue to progress workloads to maintain intensity without signs/symptoms of physical distress.    Average METs  4.5  4.75  4.62  4.83      Resistance Training   Training Prescription  Yes  No  Yes  Yes    Weight  5lbs  -  5lbs  6lbs    Reps  10-15  -  10-15  10-15    Time  10 Minutes  -  10 Minutes  10 Minutes      Interval Training   Interval  Training  No  No  No  No      Treadmill   MPH  3.6  3.6  3.6  3.6    Grade  2  2  2  2     Minutes  10  10  10  10     METs  4.75  4.75  4.75  4.75      Bike   Level  1.7  1.7  1.7  1.7    Minutes  10  10  10  10     METs  5.25  5.25  5.25  5.33      NuStep   Level  5  6  6  6     SPM  95  105  105  105    Minutes  10  10  10  10     METs  4.2  4.2  3.8  4.4      Home Exercise Plan   Plans to continue exercise at  Home (comment) Walking, Biking, Swimming  Home (comment) Walking, Biking, Swimming  Home (comment) Walking, Biking, Swimming  Home (comment) Walking, Biking, Swimming    Frequency  Add 2  additional days to program exercise sessions.  Add 2 additional days to program exercise sessions.  Add 2 additional days to program exercise sessions.  Add 2 additional days to program exercise sessions.    Initial Home Exercises Provided  06/12/18  06/12/18  06/12/18  06/12/18       Exercise Comments: Exercise Comments    Row Name 05/29/18 1632 06/21/18 1427 07/27/18 1448 08/17/18 1430     Exercise Comments  Today, was pt's first day of exercise. Pt responded well to exercise workloads. Will continue to monitor and progress pt as tolerated.   Reviewed HEP with pt. Pt verbalized understanding. Will continue to monitor and progress pt as tolerated.   Pt is responding well to exercise prescriptions. Pt puts forth great effort with exercise. Will continue to monitor.   Reviewed METs and Goals with pt. Pt continuing to respond well to exercise workloads. Will continue and progress pt as tolerated.        Exercise Goals and Review: Exercise Goals    Row Name 05/23/18 1429             Exercise Goals   Increase Physical Activity  Yes       Intervention  Provide advice, education, support and counseling about physical activity/exercise needs.;Develop an individualized exercise prescription for aerobic and resistive training based on initial evaluation findings, risk stratification,  comorbidities and participant's personal goals.       Expected Outcomes  Short Term: Attend rehab on a regular basis to increase amount of physical activity.;Long Term: Exercising regularly at least 3-5 days a week.;Long Term: Add in home exercise to make exercise part of routine and to increase amount of physical activity.       Increase Strength and Stamina  Yes       Intervention  Provide advice, education, support and counseling about physical activity/exercise needs.;Develop an individualized exercise prescription for aerobic and resistive training based on initial evaluation findings, risk stratification, comorbidities and participant's personal goals.       Expected Outcomes  Short Term: Increase workloads from initial exercise prescription for resistance, speed, and METs.;Short Term: Perform resistance training exercises routinely during rehab and add in resistance training at home;Long Term: Improve cardiorespiratory fitness, muscular endurance and strength as measured by increased METs and functional capacity (6MWT)       Able to understand and use rate of perceived exertion (RPE) scale  Yes       Intervention  Provide education and explanation on how to use RPE scale       Expected Outcomes  Short Term: Able to use RPE daily in rehab to express subjective intensity level;Long Term:  Able to use RPE to guide intensity level when exercising independently       Knowledge and understanding of Target Heart Rate Range (THRR)  Yes       Intervention  Provide education and explanation of THRR including how the numbers were predicted and where they are located for reference       Expected Outcomes  Short Term: Able to state/look up THRR;Long Term: Able to use THRR to govern intensity when exercising independently;Short Term: Able to use daily as guideline for intensity in rehab       Able to check pulse independently  Yes       Intervention  Provide education and demonstration on how to check pulse in  carotid and radial arteries.;Review the importance of being able to check your own pulse for safety  during independent exercise       Expected Outcomes  Short Term: Able to explain why pulse checking is important during independent exercise;Long Term: Able to check pulse independently and accurately       Understanding of Exercise Prescription  Yes       Intervention  Provide education, explanation, and written materials on patient's individual exercise prescription       Expected Outcomes  Short Term: Able to explain program exercise prescription;Long Term: Able to explain home exercise prescription to exercise independently          Exercise Goals Re-Evaluation : Exercise Goals Re-Evaluation    Row Name 06/21/18 1428 07/27/18 1450 08/17/18 1427 08/17/18 1431       Exercise Goal Re-Evaluation   Exercise Goals Review  Understanding of Exercise Prescription;Knowledge and understanding of Target Heart Rate Range (THRR);Able to understand and use rate of perceived exertion (RPE) scale;Increase Physical Activity;Increase Strength and Stamina;Able to check pulse independently  Increase Physical Activity;Understanding of Exercise Prescription  -  Increase Physical Activity;Understanding of Exercise Prescription    Comments  Reveiwed HEP with pt. Also reviewed THRR, RPE Scale, weather conditions, endpoints of exercise, NTG use, warmup and cool down.   Reviewed METs and Goals with pt. Will follow with pt regarding adding rower to prescription. Pt in increasing his strength and stamina.   Pt has met goals set forth at beginning of program. Pt has now been able to return to swimming, golfing and biking. Will continue to work with pt to increase stamina and strength.   Pt has met goals set forth at beginning of program. Pt has now been able to return to swimming, golfing and biking. Will continue to work with pt to increase stamina and strength.     Expected Outcomes  Pt will continue to walk daily. Pt has  silversneakers and goes to local gym 2-3 days a week. Pt really wants to return back to swimming and golfing. Pt has follow up appointment with Dr. 06/22/2018. Pt is hopeful he will be cleared to return to those activities.   Pt has returned back to swimming and also been able to travel more. Pt has been able to return back to his normal acitivities. Will continue to monitor and progress pt as tolerated.   Pt is continuing to walk and swim for exercise 2-3 days a week for 30-45 minutes. Will continue to monitor.   Pt is continuing to walk and swim for exercise 2-3 days a week for 30-45 minutes. Will continue to monitor.        Discharge Exercise Prescription (Final Exercise Prescription Changes): Exercise Prescription Changes - 08/14/18 1700      Response to Exercise   Blood Pressure (Admit)  120/70    Blood Pressure (Exercise)  128/78    Blood Pressure (Exit)  104/60    Heart Rate (Admit)  67 bpm    Heart Rate (Exercise)  120 bpm    Heart Rate (Exit)  67 bpm    Rating of Perceived Exertion (Exercise)  13    Perceived Dyspnea (Exercise)  0    Symptoms  None    Comments  None    Duration  Continue with 30 min of aerobic exercise without signs/symptoms of physical distress.    Intensity  THRR unchanged      Progression   Progression  Continue to progress workloads to maintain intensity without signs/symptoms of physical distress.    Average METs  4.83  Resistance Training   Training Prescription  Yes    Weight  6lbs    Reps  10-15    Time  10 Minutes      Interval Training   Interval Training  No      Treadmill   MPH  3.6    Grade  2    Minutes  10    METs  4.75      Bike   Level  1.7    Minutes  10    METs  5.33      NuStep   Level  6    SPM  105    Minutes  10    METs  4.4      Home Exercise Plan   Plans to continue exercise at  Home (comment)   Walking, Biking, Swimming   Frequency  Add 2 additional days to program exercise sessions.    Initial Home  Exercises Provided  06/12/18       Nutrition:  Target Goals: Understanding of nutrition guidelines, daily intake of sodium <1543m, cholesterol <2022m calories 30% from fat and 7% or less from saturated fats, daily to have 5 or more servings of fruits and vegetables.  Biometrics: Pre Biometrics - 05/23/18 1400      Pre Biometrics   Height  5' 10"  (1.778 m)    Weight  76.2 kg    Waist Circumference  36 inches    Hip Circumference  39.25 inches    Waist to Hip Ratio  0.92 %    BMI (Calculated)  24.1    Triceps Skinfold  11 mm    % Body Fat  22.9 %    Grip Strength  34 kg    Flexibility  8 in    Single Leg Stand  29.06 seconds        Nutrition Therapy Plan and Nutrition Goals: Nutrition Therapy & Goals - 05/24/18 0802      Nutrition Therapy   Diet  heart healthy      Personal Nutrition Goals   Nutrition Goal  Pt to identify and limit food sources of saturated fat, trans fat, refined carbohydrates and sodium    Personal Goal #2  Pt to cut down on beef or pork high in saturated fat.    Personal Goal #3  Pt to eat more lean protein (fish, chicken, [place]   Personal Goal #4  Pt able to name foods that affect blood glucose.      Intervention Plan   Intervention  Prescribe, educate and counsel regarding individualized specific dietary modifications aiming towards targeted core components such as weight, hypertension, lipid management, diabetes, heart failure and other comorbidities.    Expected Outcomes  Short Term Goal: Understand basic principles of dietary content, such as calories, fat, sodium, cholesterol and nutrients.;Long Term Goal: Adherence to prescribed nutrition plan.       Nutrition Assessments: Nutrition Assessments - 05/24/18 0831      MEDFICTS Scores   Pre Score  74       Nutrition Goals Re-Evaluation:   Nutrition Goals Re-Evaluation:   Nutrition Goals Discharge (Final Nutrition Goals Re-Evaluation):   Psychosocial: Target Goals: Acknowledge  presence or absence of significant depression and/or stress, maximize coping skills, provide positive support system. Participant is able to verbalize types and ability to use techniques and skills needed for reducing stress and depression.  Initial Review & Psychosocial Screening: Initial Psych Review & Screening - 05/23/18 1409  Initial Review   Current issues with  History of Depression;Current Anxiety/Panic;Current Stress Concerns    Source of Stress Concerns  Chronic Illness    Comments  h/o stress and anxiety       Family Dynamics   Good Support System?  Yes   spouse, son      Barriers   Psychosocial barriers to participate in program  The patient should benefit from training in stress management and relaxation.      Screening Interventions   Interventions  Encouraged to exercise;To provide support and resources with identified psychosocial needs;Provide feedback about the scores to participant    Expected Outcomes  Short Term goal: Utilizing psychosocial counselor, staff and physician to assist with identification of specific Stressors or current issues interfering with healing process. Setting desired goal for each stressor or current issue identified.;Long Term Goal: Stressors or current issues are controlled or eliminated.;Short Term goal: Identification and review with participant of any Quality of Life or Depression concerns found by scoring the questionnaire.;Long Term goal: The participant improves quality of Life and PHQ9 Scores as seen by post scores and/or verbalization of changes       Quality of Life Scores: Quality of Life - 05/23/18 1418      Quality of Life   Select  Quality of Life      Quality of Life Scores   Health/Function Pre  15.63 %    Socioeconomic Pre  21.86 %    Psych/Spiritual Pre  12.5 %    Family Pre  17.7 %    GLOBAL Pre  16.57 %      Scores of 19 and below usually indicate a poorer quality of life in these areas.  A difference of  2-3  points is a clinically meaningful difference.  A difference of 2-3 points in the total score of the Quality of Life Index has been associated with significant improvement in overall quality of life, self-image, physical symptoms, and general health in studies assessing change in quality of life.  PHQ-9: Recent Review Flowsheet Data    Depression screen [facility] 2/9 05/29/2018   Decreased Interest 0   Down, Depressed, Hopeless 0   PHQ - 2 Score 0     Interpretation of Total Score  Total Score Depression Severity:  1-4 = Minimal depression, 5-9 = Mild depression, 10-14 = Moderate depression, 15-19 = Moderately severe depression, 20-27 = Severe depression   Psychosocial Evaluation and Intervention: Psychosocial Evaluation - 05/29/18 1705      Psychosocial Evaluation & Interventions   Interventions  Encouraged to exercise with the program and follow exercise prescription;Stress management education;Relaxation education    Comments  Pt with history of depression, stress, and anxiety.  No acute concerns.    Expected Outcomes  Zachary Ponce will report continued management of his depression, stress, and anxiety.  He will report the use of good coping skills.    Continue Psychosocial Services   Follow up required by staff       Psychosocial Re-Evaluation: Psychosocial Re-Evaluation    [place] Name 06/08/18 1517 07/06/18 0746 07/27/18 1207 08/17/18 1437       Psychosocial Re-Evaluation   Current issues with  Current Anxiety/Panic;Current Stress Concerns  Current Anxiety/Panic;Current Stress Concerns  None Identified  None Identified    Comments  Zachary Ponce previously struggled with not being able to golf or travel like he was once able to.  He has booked a trip and is seeing his ability with exercise.   Zachary Ponce had  a reassuring OV regarding his ability to get back to golfing. He plans to resume golfing soon.   Zachary Ponce has been able to travel again.   Zachary Ponce has been able to travel again and swim again.     Expected  Outcomes  Zachary Ponce will report decreased stress and anxiety with ability to manage both with good coping skills.   Zachary Ponce will report decreased stress and anxiety with ability to manage both with good coping skills.   Zachary Ponce will continue to report ability to use his coping skills to manage stress.   Zachary Ponce will continue to report ability to use his coping skills to manage stress.     Interventions  Stress management education;Relaxation education;Encouraged to attend Cardiac Rehabilitation for the exercise  Stress management education;Relaxation education;Encouraged to attend Cardiac Rehabilitation for the exercise  Stress management education;Relaxation education;Encouraged to attend Cardiac Rehabilitation for the exercise  Stress management education;Relaxation education;Encouraged to attend Cardiac Rehabilitation for the exercise    Continue Psychosocial Services   Follow up required by staff  Follow up required by staff  No Follow up required  No Follow up required       Psychosocial Discharge (Final Psychosocial Re-Evaluation): Psychosocial Re-Evaluation - 08/17/18 1437      Psychosocial Re-Evaluation   Current issues with  None Identified    Comments  Zachary Ponce has been able to travel again and swim again.     Expected Outcomes  Zachary Ponce will continue to report ability to use his coping skills to manage stress.     Interventions  Stress management education;Relaxation education;Encouraged to attend Cardiac Rehabilitation for the exercise    Continue Psychosocial Services   No Follow up required       Vocational Rehabilitation: Provide vocational rehab assistance to qualifying candidates.   Vocational Rehab Evaluation & Intervention: Vocational Rehab - 05/23/18 1411      Initial Vocational Rehab Evaluation & Intervention   Assessment shows need for Vocational Rehabilitation  No   retired       Education: Education Goals: Education classes will be provided on a weekly basis, covering required  topics. Participant will state understanding/return demonstration of topics presented.  Learning Barriers/Preferences: Learning Barriers/Preferences - 05/23/18 1524      Learning Barriers/Preferences   Learning Barriers  Sight    Learning Preferences  Skilled Demonstration       Education Topics: Count Your Pulse:  -Group instruction provided by verbal instruction, demonstration, patient participation and written materials to support subject.  Instructors address importance of being able to find your pulse and how to count your pulse when at home without a heart monitor.  Patients get hands on experience counting their pulse with staff help and individually.   CARDIAC REHAB PHASE II EXERCISE from 08/09/2018 in [place]  Date  06/30/18  Instruction Review Code  2- Demonstrated Understanding      Heart Attack, Angina, and Risk Factor Modification:  -Group instruction provided by verbal instruction, video, and written materials to support subject.  Instructors address signs and symptoms of angina and heart attacks.    Also discuss risk factors for heart disease and how to make changes to improve heart health risk factors.   CARDIAC REHAB PHASE II EXERCISE from 08/09/2018 in [place]  Date  06/07/18  Instruction Review Code  2- Demonstrated Understanding      Functional Fitness:  -Group instruction provided by verbal instruction, demonstration, patient participation, and written materials to  support subject.  Instructors address safety measures for doing things around the house.  Discuss how to get up and down off the floor, how to pick things up properly, how to safely get out of a chair without assistance, and balance training.   Meditation and Mindfulness:  -Group instruction provided by verbal instruction, patient participation, and written materials to support subject.  Instructor addresses importance of mindfulness  and meditation practice to help reduce stress and improve awareness.  Instructor also leads participants through a meditation exercise.    CARDIAC REHAB PHASE II EXERCISE from 08/09/2018 in [place]  Date  06/28/18  Instruction Review Code  2- Demonstrated Understanding      Stretching for Flexibility and Mobility:  -Group instruction provided by verbal instruction, patient participation, and written materials to support subject.  Instructors lead participants through series of stretches that are designed to increase flexibility thus improving mobility.  These stretches are additional exercise for major muscle groups that are typically performed during regular warm up and cool down.   Hands Only CPR:  -Group verbal, video, and participation provides a basic overview of AHA guidelines for community CPR. Role-play of emergencies allow participants the opportunity to practice calling for help and chest compression technique with discussion of AED use.   Hypertension: -Group verbal and written instruction that provides a basic overview of hypertension including the most recent diagnostic guidelines, risk factor reduction with self-care instructions and medication management.    Nutrition I class: Heart Healthy Eating:  -Group instruction provided by PowerPoint slides, verbal discussion, and written materials to support subject matter. The instructor gives an explanation and review of the Therapeutic Lifestyle Changes diet recommendations, which includes a discussion on lipid goals, dietary fat, sodium, fiber, plant stanol/sterol esters, sugar, and the components of a well-balanced, healthy diet.   Nutrition II class: Lifestyle Skills:  -Group instruction provided by PowerPoint slides, verbal discussion, and written materials to support subject matter. The instructor gives an explanation and review of label reading, grocery shopping for heart health, heart healthy  recipe modifications, and ways to make healthier choices when eating out.   Diabetes Question & Answer:  -Group instruction provided by PowerPoint slides, verbal discussion, and written materials to support subject matter. The instructor gives an explanation and review of diabetes co-morbidities, pre- and post-prandial blood glucose goals, pre-exercise blood glucose goals, signs, symptoms, and treatment of hypoglycemia and hyperglycemia, and foot care basics.   Diabetes Blitz:  -Group instruction provided by PowerPoint slides, verbal discussion, and written materials to support subject matter. The instructor gives an explanation and review of the physiology behind type 1 and type 2 diabetes, diabetes medications and rational behind using different medications, pre- and post-prandial blood glucose recommendations and Hemoglobin A1c goals, diabetes diet, and exercise including blood glucose guidelines for exercising safely.    Portion Distortion:  -Group instruction provided by PowerPoint slides, verbal discussion, written materials, and food models to support subject matter. The instructor gives an explanation of serving size versus portion size, changes in portions sizes over the last 20 years, and what consists of a serving from each food group.   Stress Management:  -Group instruction provided by verbal instruction, video, and written materials to support subject matter.  Instructors review role of stress in heart disease and how to cope with stress positively.     CARDIAC REHAB PHASE II EXERCISE from 08/09/2018 in [place]  Date  07/12/18  Instruction Review  Code  2- Demonstrated Understanding      Exercising on Your Own:  -Group instruction provided by verbal instruction, power point, and written materials to support subject.  Instructors discuss benefits of exercise, components of exercise, frequency and intensity of exercise, and end points for exercise.   Also discuss use of nitroglycerin and activating EMS.  Review options of places to exercise outside of rehab.  Review guidelines for sex with heart disease.   CARDIAC REHAB PHASE II EXERCISE from 08/09/2018 in [place]  Date  06/21/18  Educator  EP  Instruction Review Code  2- Demonstrated Understanding      Cardiac Drugs I:  -Group instruction provided by verbal instruction and written materials to support subject.  Instructor reviews cardiac drug classes: antiplatelets, anticoagulants, beta blockers, and statins.  Instructor discusses reasons, side effects, and lifestyle considerations for each drug class.   Cardiac Drugs II:  -Group instruction provided by verbal instruction and written materials to support subject.  Instructor reviews cardiac drug classes: angiotensin converting enzyme inhibitors (ACE-I), angiotensin II receptor blockers (ARBs), nitrates, and calcium channel blockers.  Instructor discusses reasons, side effects, and lifestyle considerations for each drug class.   CARDIAC REHAB PHASE II EXERCISE from 08/09/2018 in [place]  Date  08/09/18  Educator  Pharmacist  Instruction Review Code  2- Demonstrated Understanding      Anatomy and Physiology of the Circulatory System:  Group verbal and written instruction and models provide basic cardiac anatomy and physiology, with the coronary electrical and arterial systems. Review of: AMI, Angina, Valve disease, Heart Failure, Peripheral Artery Disease, Cardiac Arrhythmia, Pacemakers, and the ICD.   Other Education:  -Group or individual verbal, written, or video instructions that support the educational goals of the cardiac rehab program.   Holiday Eating Survival Tips:  -Group instruction provided by PowerPoint slides, verbal discussion, and written materials to support subject matter. The instructor gives patients tips, tricks, and techniques to help them not  only survive but enjoy the holidays despite the onslaught of food that accompanies the holidays.   Knowledge Questionnaire Score: Knowledge Questionnaire Score - 05/23/18 1409      Knowledge Questionnaire Score   Pre Score  22/24       Core Components/Risk Factors/Patient Goals at Admission: Personal Goals and Risk Factors at Admission - 05/23/18 1419      Core Components/Risk Factors/Patient Goals on Admission    Weight Management  Weight Maintenance    Lipids  Yes    Intervention  Provide education and support for participant on nutrition & aerobic/resistive exercise along with prescribed medications to achieve LDL <9m, HDL >470m    Expected Outcomes  Short Term: Participant states understanding of desired cholesterol values and is compliant with medications prescribed. Participant is following exercise prescription and nutrition guidelines.;Long Term: Cholesterol controlled with medications as prescribed, with individualized exercise RX and with personalized nutrition plan. Value goals: LDL < 7035mHDL > 40 mg.    Stress  Yes    Intervention  Offer individual and/or small group education and counseling on adjustment to heart disease, stress management and health-related lifestyle change. Teach and support self-help strategies.;Refer participants experiencing significant psychosocial distress to appropriate mental health specialists for further evaluation and treatment. When possible, include family members and significant others in education/counseling sessions.    Expected Outcomes  Short Term: Participant demonstrates changes in health-related behavior, relaxation and other stress management skills, ability to obtain effective social support,  and compliance with psychotropic medications if prescribed.;Long Term: Emotional wellbeing is indicated by absence of clinically significant psychosocial distress or social isolation.       Core Components/Risk Factors/Patient Goals Review:   Goals and Risk Factor Review    Row Name 05/29/18 1708 07/06/18 0755 07/27/18 1211 08/17/18 1438       Core Components/Risk Factors/Patient Goals Review   Personal Goals Review  Weight Management/Obesity;Lipids;Stress  Weight Management/Obesity;Lipids;Stress  Weight Management/Obesity;Lipids;Stress  Weight Management/Obesity;Lipids;Stress    Review  Pt with multiple CAD RFs willing to participate in CR exercise.  Pt would like to be able to travel and golf.  Pt with multiple CAD RFs willing to participate in CR exercise.  Pt would like to be able to travel and golf.  Pt with multiple CAD RFs willing to participate in CR exercise. Zachary Ponce has been able to travel.  He is tolerating exercise well.   Pt with multiple CAD RFs willing to participate in CR exercise. Zachary Ponce has been able to travel and swim again.  He is tolerating exercise well.  He will graduate soon on 08/28/18.     Expected Outcomes  Pt will continue to participate in CR exercise, nutrition, and lifestyle modification opportunities.  Pt will continue to participate in CR exercise, nutrition, and lifestyle modification opportunities.  Pt will continue to participate in CR exercise, nutrition, and lifestyle modification opportunities.  Pt will continue to participate in CR exercise, nutrition, and lifestyle modification opportunities.       Core Components/Risk Factors/Patient Goals at Discharge (Final Review):  Goals and Risk Factor Review - 08/17/18 1438      Core Components/Risk Factors/Patient Goals Review   Personal Goals Review  Weight Management/Obesity;Lipids;Stress    Review  Pt with multiple CAD RFs willing to participate in CR exercise. Zachary Ponce has been able to travel and swim again.  He is tolerating exercise well.  He will graduate soon on 08/28/18.     Expected Outcomes  Pt will continue to participate in CR exercise, nutrition, and lifestyle modification opportunities.       ITP Comments: ITP Comments    Row Name 05/22/18  1700 05/23/18 1409 05/29/18 1705 07/06/18 0745 07/27/18 1153   ITP Comments  Dr. Fransico Him, Medical Director   Dr. Fransico Him, Medical Director   30 Day ITP Review. Pt started cardiac rehab today and tolerated exercise well.   30 Day ITP Review. Pt is enjoying CR and the opportunity to gain an exercise routine. VSS.  30 Day ITP Review. Zachary Ponce continues to tolerate exercise well.  He has been able to travel.    [place] Name 08/17/18 1434           ITP Comments  30 Day ITP Review. Zachary Ponce continues to tolerate exercise well.  He has been able to travel and resume swimming.  He will graduate soon on 08/28/18.           Comments: See ITP Comments.

## 2018-08-18 ENCOUNTER — Encounter (HOSPITAL_COMMUNITY)
Admission: RE | Admit: 2018-08-18 | Discharge: 2018-08-18 | Disposition: A | Discharge status: Home / Self Care | Payer: Medicare Other | Source: Ambulatory Visit | Attending: Pulmonary Disease | Admitting: Pulmonary Disease

## 2018-08-18 ENCOUNTER — Encounter (HOSPITAL_COMMUNITY): Payer: Medicare Other

## 2018-08-18 DIAGNOSIS — Z951 Presence of aortocoronary bypass graft: Secondary | ICD-10-CM

## 2018-08-18 DIAGNOSIS — I2510 Atherosclerotic heart disease of native coronary artery without angina pectoris: Secondary | ICD-10-CM | POA: Diagnosis not present

## 2018-08-21 ENCOUNTER — Encounter (HOSPITAL_COMMUNITY)
Admission: RE | Admit: 2018-08-21 | Discharge: 2018-08-21 | Disposition: A | Discharge status: Home / Self Care | Payer: Medicare Other | Source: Ambulatory Visit | Attending: Pulmonary Disease | Admitting: Pulmonary Disease

## 2018-08-21 ENCOUNTER — Encounter (HOSPITAL_COMMUNITY): Payer: Medicare Other

## 2018-08-21 DIAGNOSIS — Z951 Presence of aortocoronary bypass graft: Secondary | ICD-10-CM

## 2018-08-21 DIAGNOSIS — I2510 Atherosclerotic heart disease of native coronary artery without angina pectoris: Secondary | ICD-10-CM | POA: Diagnosis not present

## 2018-08-23 ENCOUNTER — Encounter (HOSPITAL_COMMUNITY): Payer: Medicare Other

## 2018-08-23 ENCOUNTER — Encounter (HOSPITAL_COMMUNITY)
Admission: RE | Admit: 2018-08-23 | Discharge: 2018-08-23 | Disposition: A | Discharge status: Home / Self Care | Payer: Medicare Other | Source: Ambulatory Visit | Attending: Pulmonary Disease | Admitting: Pulmonary Disease

## 2018-08-23 DIAGNOSIS — I2510 Atherosclerotic heart disease of native coronary artery without angina pectoris: Secondary | ICD-10-CM | POA: Diagnosis not present

## 2018-08-23 DIAGNOSIS — Z951 Presence of aortocoronary bypass graft: Secondary | ICD-10-CM

## 2018-08-25 ENCOUNTER — Encounter (HOSPITAL_COMMUNITY)
Admission: RE | Admit: 2018-08-25 | Discharge: 2018-08-25 | Disposition: A | Discharge status: Home / Self Care | Payer: Medicare Other | Source: Ambulatory Visit | Attending: Pulmonary Disease | Admitting: Pulmonary Disease

## 2018-08-25 ENCOUNTER — Encounter (HOSPITAL_COMMUNITY): Payer: Medicare Other

## 2018-08-25 DIAGNOSIS — I2510 Atherosclerotic heart disease of native coronary artery without angina pectoris: Secondary | ICD-10-CM | POA: Diagnosis not present

## 2018-08-25 DIAGNOSIS — Z951 Presence of aortocoronary bypass graft: Secondary | ICD-10-CM

## 2018-08-28 ENCOUNTER — Encounter (HOSPITAL_COMMUNITY)
Admission: RE | Admit: 2018-08-28 | Discharge: 2018-08-28 | Disposition: A | Discharge status: Home / Self Care | Payer: Medicare Other | Source: Ambulatory Visit | Attending: Pulmonary Disease | Admitting: Pulmonary Disease

## 2018-08-28 ENCOUNTER — Encounter (HOSPITAL_COMMUNITY): Payer: Medicare Other

## 2018-08-28 VITALS — Ht 70.0 in | Wt 163.1 lb

## 2018-08-28 DIAGNOSIS — Z951 Presence of aortocoronary bypass graft: Secondary | ICD-10-CM

## 2018-08-28 DIAGNOSIS — I2510 Atherosclerotic heart disease of native coronary artery without angina pectoris: Secondary | ICD-10-CM | POA: Diagnosis not present

## 2018-09-15 ENCOUNTER — Telehealth: Payer: Self-pay

## 2018-09-15 NOTE — Progress Notes (Signed)
Discharge Progress Report  Patient Details  Name: Zachary Ponce MRN: [id number] Date of Birth: 01-18-1951 Referring Provider:     CARDIAC REHAB PHASE II ORIENTATION from 05/23/2018 in [place]  Referring Provider  Jacolyn Reedy, MD       Number of Visits: 36  Reason for Discharge:  Patient reached a stable level of exercise. Patient independent in their exercise.  Smoking History:  Social History   Tobacco Use  Smoking Status Never Smoker  Smokeless Tobacco Never Used    Diagnosis:  03/08/2018 S/P CABG (coronary artery bypass graft)  ADL UCSD:   Initial Exercise Prescription: Initial Exercise Prescription - 05/23/18 1400      Date of Initial Exercise RX and Referring Provider   Date  05/23/18    Referring Provider  Jacolyn Reedy, MD      Treadmill   MPH  2.6    Grade  1    Minutes  10    METs  3.35      Bike   Level  1.2    Minutes  10    METs  3.93      NuStep   Level  4    SPM  85    Minutes  10    METs  3      Prescription Details   Frequency (times per week)  3    Duration  Progress to 30 minutes of continuous aerobic without signs/symptoms of physical distress      Intensity   THRR 40-80% of Max Heartrate  61-122    Ratings of Perceived Exertion  11-13    Perceived Dyspnea  0-4      Progression   Progression  Continue to progress workloads to maintain intensity without signs/symptoms of physical distress.      Resistance Training   Training Prescription  Yes    Weight  3lbs    Reps  10-15       Discharge Exercise Prescription (Final Exercise Prescription Changes): Exercise Prescription Changes - 08/28/18 1123      Response to Exercise   Blood Pressure (Admit)  102/60    Blood Pressure (Exercise)  128/78    Blood Pressure (Exit)  108/62    Heart Rate (Admit)  65 bpm    Heart Rate (Exercise)  112 bpm    Heart Rate (Exit)  64 bpm    Rating of Perceived Exertion (Exercise)  13     Perceived Dyspnea (Exercise)  0    Symptoms  None    Comments  None    Duration  Continue with 45 min of aerobic exercise without signs/symptoms of physical distress.    Intensity  THRR unchanged      Progression   Progression  Continue to progress workloads to maintain intensity without signs/symptoms of physical distress.    Average METs  4.9      Resistance Training   Training Prescription  Yes    Weight  6lbs    Reps  10-15    Time  10 Minutes      Interval Training   Interval Training  No      Treadmill   MPH  3.6    Grade  4    Minutes  10    METs  5.74      Bike   Level  1.7    Minutes  10    METs  5.33  NuStep   Level  6    SPM  105    Minutes  10    METs  3.5      Home Exercise Plan   Plans to continue exercise at  Home (comment)   Walking, Biking, Swimming   Frequency  Add 2 additional days to program exercise sessions.    Initial Home Exercises Provided  06/12/18       Functional Capacity: 6 Minute Walk    Row Name 05/23/18 1400 09/01/18 1417       6 Minute Walk   Phase  Initial  Discharge    Distance  1757 feet  2214 feet    Distance % Change  -  26.01 %    Distance Feet Change  -  457 ft    Walk Time  6 minutes  6 minutes    # of Rest Breaks  0  0    MPH  3.33  4.19    METS  3.94  5    RPE  9  12    Perceived Dyspnea   0  0    VO2 Peak  13.79  17.43    Symptoms  No  No    Resting HR  68 bpm  66 bpm    Resting BP  102/62  120/78    Resting Oxygen Saturation   98 %  -    Exercise Oxygen Saturation  during 6 min walk  99 %  -    Max Ex. HR  87 bpm  95 bpm    Max Ex. BP  122/68  140/80    2 Minute Post BP  104/72  104/68       Psychological, QOL, Others - Outcomes: PHQ 2/9: Depression screen [facility] 2/9 08/28/2018 05/29/2018  Decreased Interest 0 0  Down, Depressed, Hopeless 0 0  PHQ - 2 Score 0 0    Quality of Life: Quality of Life - 08/25/18 1352      Quality of Life Scores   Health/Function Pre  15.63 %    Health/Function  Post  24.86 %    Health/Function % Change  59.05 %    Socioeconomic Pre  21.86 %    Socioeconomic Post  25.67 %    Socioeconomic % Change   17.43 %    Psych/Spiritual Pre  12.5 %    Psych/Spiritual Post  25.5 %    Psych/Spiritual % Change  104 %    Family Pre  17.7 %    Family Post  24 %    Family % Change  35.59 %    GLOBAL Pre  16.57 %    GLOBAL Post  25.02 %    GLOBAL % Change  51 %       Personal Goals: Goals established at orientation with interventions provided to work toward goal. Personal Goals and Risk Factors at Admission - 05/23/18 1419      Core Components/Risk Factors/Patient Goals on Admission    Weight Management  Weight Maintenance    Lipids  Yes    Intervention  Provide education and support for participant on nutrition & aerobic/resistive exercise along with prescribed medications to achieve LDL <90m, HDL >424m    Expected Outcomes  Short Term: Participant states understanding of desired cholesterol values and is compliant with medications prescribed. Participant is following exercise prescription and nutrition guidelines.;Long Term: Cholesterol controlled with medications as prescribed, with individualized exercise RX and with personalized nutrition plan. Value  goals: LDL < 55m, HDL > 40 mg.    Stress  Yes    Intervention  Offer individual and/or small group education and counseling on adjustment to heart disease, stress management and health-related lifestyle change. Teach and support self-help strategies.;Refer participants experiencing significant psychosocial distress to appropriate mental health specialists for further evaluation and treatment. When possible, include family members and significant others in education/counseling sessions.    Expected Outcomes  Short Term: Participant demonstrates changes in health-related behavior, relaxation and other stress management skills, ability to obtain effective social support, and compliance with psychotropic  medications if prescribed.;Long Term: Emotional wellbeing is indicated by absence of clinically significant psychosocial distress or social isolation.        Personal Goals Discharge: Goals and Risk Factor Review    Row Name 05/29/18 1708 07/06/18 0755 07/27/18 1211 08/17/18 1438 08/28/18 1216     Core Components/Risk Factors/Patient Goals Review   Personal Goals Review  Weight Management/Obesity;Lipids;Stress  Weight Management/Obesity;Lipids;Stress  Weight Management/Obesity;Lipids;Stress  Weight Management/Obesity;Lipids;Stress  Weight Management/Obesity;Lipids;Stress   Review  Pt with multiple CAD RFs willing to participate in CR exercise.  Pt would like to be able to travel and golf.  Pt with multiple CAD RFs willing to participate in CR exercise.  Pt would like to be able to travel and golf.  Pt with multiple CAD RFs willing to participate in CR exercise. DMarden Noblehas been able to travel.  He is tolerating exercise well.   Pt with multiple CAD RFs willing to participate in CR exercise. DMarden Noblehas been able to travel and swim again.  He is tolerating exercise well.  He will graduate soon on 08/28/18.   DMarden Noblehas graduated with 36 completed sessions. He did well during the program.  He has increased his self-confidence and strength.    Expected Outcomes  Pt will continue to participate in CR exercise, nutrition, and lifestyle modification opportunities.  Pt will continue to participate in CR exercise, nutrition, and lifestyle modification opportunities.  Pt will continue to participate in CR exercise, nutrition, and lifestyle modification opportunities.  Pt will continue to participate in CR exercise, nutrition, and lifestyle modification opportunities.  Pt will continue to participate in exercise, nutrition, and lifestyle modification opportunities. [facility]       Exercise Goals and Review: Exercise Goals    Row Name 05/23/18 1429             Exercise Goals    Increase Physical Activity  Yes       Intervention  Provide advice, education, support and counseling about physical activity/exercise needs.;Develop an individualized exercise prescription for aerobic and resistive training based on initial evaluation findings, risk stratification, comorbidities and participant's personal goals.       Expected Outcomes  Short Term: Attend rehab on a regular basis to increase amount of physical activity.;Long Term: Exercising regularly at least 3-5 days a week.;Long Term: Add in home exercise to make exercise part of routine and to increase amount of physical activity.       Increase Strength and Stamina  Yes       Intervention  Provide advice, education, support and counseling about physical activity/exercise needs.;Develop an individualized exercise prescription for aerobic and resistive training based on initial evaluation findings, risk stratification, comorbidities and participant's personal goals.       Expected Outcomes  Short Term: Increase workloads from initial exercise prescription for resistance, speed, and METs.;Short Term: Perform resistance training  exercises routinely during rehab and add in resistance training at home;Long Term: Improve cardiorespiratory fitness, muscular endurance and strength as measured by increased METs and functional capacity (6MWT)       Able to understand and use rate of perceived exertion (RPE) scale  Yes       Intervention  Provide education and explanation on how to use RPE scale       Expected Outcomes  Short Term: Able to use RPE daily in rehab to express subjective intensity level;Long Term:  Able to use RPE to guide intensity level when exercising independently       Knowledge and understanding of Target Heart Rate Range (THRR)  Yes       Intervention  Provide education and explanation of THRR including how the numbers were predicted and where they are located for reference       Expected Outcomes  Short Term: Able to  state/look up THRR;Long Term: Able to use THRR to govern intensity when exercising independently;Short Term: Able to use daily as guideline for intensity in rehab       Able to check pulse independently  Yes       Intervention  Provide education and demonstration on how to check pulse in carotid and radial arteries.;Review the importance of being able to check your own pulse for safety during independent exercise       Expected Outcomes  Short Term: Able to explain why pulse checking is important during independent exercise;Long Term: Able to check pulse independently and accurately       Understanding of Exercise Prescription  Yes       Intervention  Provide education, explanation, and written materials on patient's individual exercise prescription       Expected Outcomes  Short Term: Able to explain program exercise prescription;Long Term: Able to explain home exercise prescription to exercise independently          Exercise Goals Re-Evaluation: Exercise Goals Re-Evaluation    Row Name 06/21/18 1428 07/27/18 1450 08/17/18 1427 08/17/18 1431 09/01/18 1420     Exercise Goal Re-Evaluation   Exercise Goals Review  Understanding of Exercise Prescription;Knowledge and understanding of Target Heart Rate Range (THRR);Able to understand and use rate of perceived exertion (RPE) scale;Increase Physical Activity;Increase Strength and Stamina;Able to check pulse independently  Increase Physical Activity;Understanding of Exercise Prescription  -  Increase Physical Activity;Understanding of Exercise Prescription  Increase Physical Activity;Understanding of Exercise Prescription   Comments  Reveiwed HEP with pt. Also reviewed THRR, RPE Scale, weather conditions, endpoints of exercise, NTG use, warmup and cool down.   Reviewed METs and Goals with pt. Will follow with pt regarding adding rower to prescription. Pt in increasing his strength and stamina.   Pt has met goals set forth at beginning of program. Pt has now  been able to return to swimming, golfing and biking. Will continue to work with pt to increase stamina and strength.   Pt has met goals set forth at beginning of program. Pt has now been able to return to swimming, golfing and biking. Will continue to work with pt to increase stamina and strength.   Pt completed 36 sessions of rehab. Pt increased funcitonal capacity by 26.01%. Pt increased post 6MWT distance by 457 ft. Pt's goals was to be able to return to golfing, traveling, swimming and biking at start of program. Pt has increased his strength and has been able to resusme all activities.    Expected Outcomes  Pt will continue  to walk daily. Pt has silversneakers and goes to local gym 2-3 days a week. Pt really wants to return back to swimming and golfing. Pt has follow up appointment with Dr. 06/22/2018. Pt is hopeful he will be cleared to return to those activities.   Pt has returned back to swimming and also been able to travel more. Pt has been able to return back to his normal acitivities. Will continue to monitor and progress pt as tolerated.   Pt is continuing to walk and swim for exercise 2-3 days a week for 30-45 minutes. Will continue to monitor.   Pt is continuing to walk and swim for exercise 2-3 days a week for 30-45 minutes. Will continue to monitor.   Pt will continue to bike, walk and swim for exercise. Pt plans to exercise most days of the week for 30-45 minutes.       Nutrition & Weight - Outcomes: Pre Biometrics - 05/23/18 1400      Pre Biometrics   Height  5' 10"  (1.778 m)    Weight  76.2 kg    Waist Circumference  36 inches    Hip Circumference  39.25 inches    Waist to Hip Ratio  0.92 %    BMI (Calculated)  24.1    Triceps Skinfold  11 mm    % Body Fat  22.9 %    Grip Strength  34 kg    Flexibility  8 in    Single Leg Stand  29.06 seconds      Post Biometrics - 09/01/18 1417       Post  Biometrics   Height  5' 10"  (1.778 m)    Weight  74 kg    Waist Circumference   36 inches    Hip Circumference  37 inches    Waist to Hip Ratio  0.97 %    BMI (Calculated)  23.41    Triceps Skinfold  11 mm    % Body Fat  22.7 %    Grip Strength  35 kg    Flexibility  10 in    Single Leg Stand  30.44 seconds       Nutrition: Nutrition Therapy & Goals - 05/24/18 0802      Nutrition Therapy   Diet  heart healthy      Personal Nutrition Goals   Nutrition Goal  Pt to identify and limit food sources of saturated fat, trans fat, refined carbohydrates and sodium    Personal Goal #2  Pt to cut down on beef or pork high in saturated fat.    Personal Goal #3  Pt to eat more lean protein (fish, chicken, [place])    Personal Goal #4  Pt able to name foods that affect blood glucose.      Intervention Plan   Intervention  Prescribe, educate and counsel regarding individualized specific dietary modifications aiming towards targeted core components such as weight, hypertension, lipid management, diabetes, heart failure and other comorbidities.    Expected Outcomes  Short Term Goal: Understand basic principles of dietary content, such as calories, fat, sodium, cholesterol and nutrients.;Long Term Goal: Adherence to prescribed nutrition plan.       Nutrition Discharge: Nutrition Assessments - 09/08/18 1019      MEDFICTS Scores   Pre Score  74    Post Score  43    Score Difference  -31       Education Questionnaire Score: Knowledge Questionnaire Score - 08/25/18 1352  Knowledge Questionnaire Score   Pre Score  22/24    Post Score  22/24       Goals reviewed with patient; copy given to patient.

## 2018-09-15 NOTE — Telephone Encounter (Signed)
Mr. Kostrzewski has called me today asking about the 12-week, twice weekly PREP at the [facility] and is interested in joining the upcoming class on Jan 27th.  He stated he has a f/u cardiology appt on 1/13 and will discuss exercise w/his doctor and get back to me to schedule an intake appointment as long as he is released to participate by his doctor.

## 2018-09-17 NOTE — Progress Notes (Signed)
Cardiology Office Note:    Date:  09/18/2018   ID:  Zachary LamerDouglas S Quintanar, DOB [birth year], MRN [id number]  PCP:  [name], William, MD  Cardiologist:  Norman HerrlichBrian Munley, MD    Referring MD: [name], William, MD    ASSESSMENT:    1. Coronary artery disease involving native coronary artery of native heart with angina pectoris (HCC)   2. Hyperlipidemia, unspecified hyperlipidemia type   3. Bilateral carotid artery stenosis    PLAN:    In order of problems listed above:  1. Stable offered him reassurance continue medical therapy with aspirin propranolol and his statin.  Check labs including lipid profile CMP for safety and efficacy 2. Continue statin check lipid profile 3. Follow-up vascular surgery   Next appointment: 6 months   Medication Adjustments/Labs and Tests Ordered: Current medicines are reviewed at length with the patient today.  Concerns regarding medicines are outlined above.  Orders Placed This Encounter  Procedures  . Comprehensive Metabolic Panel (CMET)  . Lipid Profile   No orders of the defined types were placed in this encounter.   Chief Complaint  Patient presents with  . Follow-up  . Coronary Artery Disease  . Hyperlipidemia    History of Present Illness:    Zachary Ponce is a 68 y.o. male with a hx of CAD with coronary artery bypass surgery 0 03/07/2018 with left carotid endarterectomy.  Other problems include paroxysmal atrial fibrillation anticoagulation and hyperlipidemia , he was last seen 06/22/18. A 14 day monitor was normal wo any atrial fibrillation noted. Compliance with diet, lifestyle and medications: Yes  He is seen in follow-up someone anxious and her insurance self especially [facility]  I gave him a copy of his discharge instructions from cardiac rehab.  He has had no angina dyspnea palpitation or syncope.  His event monitor showed no recurrent atrial fibrillation I think in this situation we can withdraw  Eliquis last approach was not from adapter to screen his heart rhythm at home symptomatic and asymptomatic for recurrence.  If recurrent atrial fibrillation would resume anticoagulation long-term Past Medical History:  Diagnosis Date  . ADHD (attention deficit hyperactivity disorder) 03/03/2018  . CAD (coronary artery disease) 03/03/2018   Cardiac cath  03/03/18 70% distal left main, 99% proximal LAD, 40-50% proximal circumflex, 60% intermediate, 90% ostial right coronary artery, 55% LVEF with some mild distal anterior hypokinesis  . Depression 06/30/2016  . Essential tremor   . History of traumatic head injury   . Hyperlipidemia 03/03/2018    Past Surgical History:  Procedure Laterality Date  . ANKLE SURGERY    . CORONARY ARTERY BYPASS GRAFT N/A 03/08/2018   Procedure: CORONARY ARTERY BYPASS GRAFTING (CABG) X4, RIGHT SAPHENOUS VEIN HARVEST. LIMA TO LAD, SVG TO OM, SVG TO RAMUS, SVG TO PD.;  Surgeon: Kerin PernaVan Trigt, Peter, MD;  Location: [facility] OR;  Service: Open Heart Surgery;  Laterality: N/A;  . ENDARTERECTOMY Left 03/08/2018   Procedure: ENDARTERECTOMY CAROTID;  Surgeon: Sherren KernsFields, Charles E, MD;  [facility];  Service: Vascular;  Laterality: Left;  . KNEE SURGERY    . LEFT HEART CATH AND CORONARY ANGIOGRAPHY N/A 03/03/2018   Procedure: LEFT HEART CATH AND CORONARY ANGIOGRAPHY;  Surgeon: Lennette BihariKelly, Thomas A, MD;  Location: [facility] INVASIVE CV LAB;  Service: Cardiovascular;  Laterality: N/A;  . TEE WITHOUT CARDIOVERSION N/A 03/08/2018   Procedure: TRANSESOPHAGEAL ECHOCARDIOGRAM (TEE);  Surgeon: Donata ClayVan Trigt, Theron AristaPeter, MD;  [facility];  Service: Open Heart Surgery;  Laterality:  N/A;  . TONSILLECTOMY      Current Medications: Current Meds  Medication Sig  . aspirin EC 81 MG EC tablet Take 1 tablet (81 mg total) by mouth daily.  [name] atorvastatin (LIPITOR) 20 MG tablet Take 1 tablet (20 mg total) by mouth daily at 6 PM.  . buPROPion (WELLBUTRIN XL) 300 MG 24 hr tablet Take 300 mg by mouth daily.  [name] escitalopram  (LEXAPRO) 5 MG tablet Take 5 mg by mouth daily.  . NON FORMULARY Apply 50 mg topically daily. Testosterone 50mg /ml  . propranolol ER (INDERAL LA) 80 MG 24 hr capsule Take 80 mg by mouth daily.  [name] VYVANSE 30 MG capsule Take 30 mg by mouth daily.  . [DISCONTINUED] apixaban (ELIQUIS) 5 MG TABS tablet Take 1 tablet (5 mg total) by mouth 2 (two) times daily. (Patient taking differently: Take 5 mg by mouth 2 (two) times daily. )     Allergies:   Patient has no known allergies.   Social History   Socioeconomic History  . Marital status: Married    Spouse name: Not on file  . Number of children: Not on file  . Years of education: Not on file  . Highest education level: Not on file  Occupational History  . Not on file  Social Needs  . Financial resource strain: Not on file  . Food insecurity:    Worry: Not on file    Inability: Not on file  . Transportation needs:    Medical: Not on file    Non-medical: Not on file  Tobacco Use  . Smoking status: Never Smoker  . Smokeless tobacco: Never Used  Substance and Sexual Activity  . Alcohol use: Not Currently  . Drug use: Never  . Sexual activity: Yes  Lifestyle  . Physical activity:    Days per week: Not on file    Minutes per session: Not on file  . Stress: Not on file  Relationships  . Social connections:    Talks on phone: Not on file    Gets together: Not on file    Attends religious service: Not on file    Active member of club or organization: Not on file    Attends meetings of clubs or organizations: Not on file    Relationship status: Not on file  Other Topics Concern  . Not on file  Social History Narrative  . Not on file     Family History: The patient's family history includes Diabetes in his brother; Lung cancer in his mother. ROS:   Please see the history of present illness.    All other systems reviewed and are negative.  EKGs/Labs/Other Studies Reviewed:    The following studies were reviewed  today:   Recent Labs: 03/04/2018: TSH 0.853 03/09/2018: Magnesium 2.5 03/14/2018: Hemoglobin 10.3; Platelets 281 06/22/2018: ALT 48; BUN 23; Creatinine, Ser 0.87; Potassium 4.8; Sodium 140  Recent Lipid Panel    Component Value Date/Time   CHOL 108 06/22/2018 1410   TRIG 108 06/22/2018 1410   HDL 34 (L) 06/22/2018 1410   CHOLHDL 3.2 06/22/2018 1410   LDLCALC 52 06/22/2018 1410    Physical Exam:    VS:  BP 110/60 (BP Location: Right Arm, Patient Position: Sitting, Cuff Size: Normal)   Pulse 65   Ht 5\' 10"  (1.778 m)   Wt 162 lb (73.5 kg)   SpO2 98%   BMI 23.24 kg/m     Wt Readings from Last 3 Encounters:  09/18/18 162  lb (73.5 kg)  09/01/18 163 lb 2.3 oz (74 kg)  06/22/18 168 lb (76.2 kg)     GEN:  Well nourished, well developed in no acute distress HEENT: Normal NECK: No JVD; No carotid bruits LYMPHATICS: No lymphadenopathy CARDIAC: RRR, no murmurs, rubs, gallops RESPIRATORY:  Clear to auscultation without rales, wheezing or rhonchi  ABDOMEN: Soft, non-tender, non-distended MUSCULOSKELETAL:  No edema; No deformity  SKIN: Warm and dry NEUROLOGIC:  Alert and oriented x 3 PSYCHIATRIC:  Normal affect    Signed, Norman HerrlichBrian Munley, MD  09/18/2018 3:39 PM    [facility]

## 2018-09-18 ENCOUNTER — Encounter: Payer: Self-pay | Admitting: Cardiology

## 2018-09-18 ENCOUNTER — Ambulatory Visit: Payer: Medicare Other | Admitting: Cardiology

## 2018-09-18 VITALS — BP 110/60 | HR 65 | Ht 70.0 in | Wt 162.0 lb

## 2018-09-18 DIAGNOSIS — I25119 Atherosclerotic heart disease of native coronary artery with unspecified angina pectoris: Secondary | ICD-10-CM | POA: Diagnosis not present

## 2018-09-18 DIAGNOSIS — I6523 Occlusion and stenosis of bilateral carotid arteries: Secondary | ICD-10-CM

## 2018-09-18 DIAGNOSIS — E785 Hyperlipidemia, unspecified: Secondary | ICD-10-CM

## 2018-09-18 NOTE — Patient Instructions (Addendum)
Medication Instructions:  Your physician has recommended you make the following change in your medication:   STOP ELIQUIS  If you need a refill on your cardiac medications before your next appointment, please call your pharmacy.   Lab work: Your physician recommends that you get lab work today: CMP, lipid profile  If you have labs (blood work) drawn today and your tests are completely normal, you will receive your results only by: [name] MyChart Message (if you have MyChart) OR . A paper copy in the mail If you have any lab test that is abnormal or we need to change your treatment, we will call you to review the results.  Testing/Procedures: None  Follow-Up: At [facility], you and your health needs are our priority.  As part of our continuing mission to provide you with exceptional heart care, we have created designated Provider Care Teams.  These Care Teams include your primary Cardiologist (physician) and Advanced Practice Providers (APPs -  Physician Assistants and Nurse Practitioners) who all work together to provide you with the care you need, when you need it. You will need a follow up appointment in 6 months.  Please call our office 2 months in advance to schedule this appointment.       KardiaMobile [email or web address]        FDA-cleared, clinical grade mobile EKG monitor: Lourena Simmonds is the most clinically-validated mobile EKG used by the world's leading cardiac care medical professionals With Basic service, know instantly if your heart rhythm is normal or if atrial fibrillation is detected, and email the last single EKG recording to yourself or your doctor Premium service, available for purchase through the Kardia app for $9.99 per month or $99 per year, includes unlimited history and storage of your EKG recordings, a monthly EKG summary report to share with your doctor, along with the ability to track your blood pressure, activity and weight Includes  one KardiaMobile phone clip FREE SHIPPING: Standard delivery 1-3 business days. Orders placed by 11:00am PST will ship that afternoon. Otherwise, will ship next business day. All orders ship via [employer] from [place], [place]

## 2018-09-19 LAB — COMPREHENSIVE METABOLIC PANEL
ALT: 25 IU/L (ref 0–44)
AST: 24 IU/L (ref 0–40)
Albumin/Globulin Ratio: 1.7 (ref 1.2–2.2)
Albumin: 4 g/dL (ref 3.6–4.8)
Alkaline Phosphatase: 117 IU/L (ref 39–117)
BUN/Creatinine Ratio: 20 (ref 10–24)
BUN: 20 mg/dL (ref 8–27)
Bilirubin Total: 0.5 mg/dL (ref 0.0–1.2)
CO2: 23 mmol/L (ref 20–29)
Calcium: 9 mg/dL (ref 8.6–10.2)
Chloride: 102 mmol/L (ref 96–106)
Creatinine, Ser: 1.01 mg/dL (ref 0.76–1.27)
GFR calc Af Amer: 89 mL/min/{1.73_m2} (ref >59)
GFR calc non Af Amer: 77 mL/min/{1.73_m2} (ref >59)
Globulin, Total: 2.3 g/dL (ref 1.5–4.5)
Glucose: 93 mg/dL (ref 65–99)
Potassium: 4.6 mmol/L (ref 3.5–5.2)
Sodium: 138 mmol/L (ref 134–144)
Total Protein: 6.3 g/dL (ref 6.0–8.5)

## 2018-09-19 LAB — LIPID PANEL
Chol/HDL Ratio: 2.6 ratio (ref 0.0–5.0)
Cholesterol, Total: 93 mg/dL — ABNORMAL LOW (ref 100–199)
HDL: 36 mg/dL — ABNORMAL LOW (ref >39)
LDL Calculated: 43 mg/dL (ref 0–99)
Triglycerides: 69 mg/dL (ref 0–149)
VLDL Cholesterol Cal: 14 mg/dL (ref 5–40)

## 2018-09-22 ENCOUNTER — Ambulatory Visit (HOSPITAL_COMMUNITY)
Admission: RE | Admit: 2018-09-22 | Discharge: 2018-09-22 | Disposition: A | Discharge status: Home / Self Care | Payer: Medicare Other | Source: Ambulatory Visit | Attending: Vascular Surgery | Admitting: Vascular Surgery

## 2018-09-22 DIAGNOSIS — I6523 Occlusion and stenosis of bilateral carotid arteries: Secondary | ICD-10-CM | POA: Diagnosis not present

## 2018-09-29 ENCOUNTER — Ambulatory Visit: Payer: Medicare Other | Admitting: Cardiology

## 2018-10-08 NOTE — Progress Notes (Signed)
[facility] PREP Progress Report   Patient Details  Name: Zachary Ponce MRN: [id number] Date of Birth: 1951-01-26 Age: 68 y.o. PCP: Zachary BlamerHarris, William, Ponce  Vitals:   09/29/18 2236  BP: 120/78  Pulse: 66  Resp: 18  SpO2: 99%  Weight: 163 lb (73.9 kg)     [facility] Eval - 10/08/18 2200      Referral    Referring Provider  Cardiac rehab    Reason for referral  High Cholesterol    Program Start Date  10/02/18      Measurement   Neck measurement  15.5 Inches    Waist Circumference  38 inches    Body fat  24.2 percent      Information for Trainer   Goals  "to gain strength, gain 10lbs, become better w/nutrition labels, and maybe get up to slow jogs on treadmil and exercise regularly"    Current Exercise  walk 4x/wk approx 2-4 miles as well [facility]"    Pertinent Medical History  see chart    Current Barriers  "laziness"    Medications that affect exercise  Beta blocker      Mobility and Daily Activities   I find it easy to walk up or down two or more flights of stairs.  4    I have no trouble taking out the trash.  4    I do housework such as vacuuming and dusting on my own without difficulty.  4    I can easily lift a gallon of milk (8lbs).  4    I can easily walk a mile.  4    I have no trouble reaching into high cupboards or reaching down to pick up something from the floor.  4    I do not have trouble doing out-door work such as Loss adjuster, charteredmoving the lawn, raking leaves, or gardening.  4      Mobility and Daily Activities   I feel younger than my age.  3    I feel independent.  4    I feel energetic.  3    I live an active life.   2    I feel strong.  2    I feel healthy.  3    I feel active as other people my age.  3      How fit and strong are you.   Fit and Strong Total Score  48      Past Medical History:  Diagnosis Date  . ADHD (attention deficit hyperactivity disorder) 03/03/2018  . CAD (coronary artery disease) 03/03/2018   Cardiac cath   03/03/18 70% distal left main, 99% proximal LAD, 40-50% proximal circumflex, 60% intermediate, 90% ostial right coronary artery, 55% LVEF with some mild distal anterior hypokinesis  . Depression 06/30/2016  . Essential tremor   . History of traumatic head injury   . Hyperlipidemia 03/03/2018   Past Surgical History:  Procedure Laterality Date  . ANKLE SURGERY    . CORONARY ARTERY BYPASS GRAFT N/A 03/08/2018   Procedure: CORONARY ARTERY BYPASS GRAFTING (CABG) X4, RIGHT SAPHENOUS VEIN HARVEST. LIMA TO LAD, SVG TO OM, SVG TO RAMUS, SVG TO PD.;  Surgeon: Zachary PernaVan Trigt, Peter, Ponce;  Location: [facility] OR;  Service: Open Heart Surgery;  Laterality: N/A;  . ENDARTERECTOMY Left 03/08/2018   Procedure: ENDARTERECTOMY CAROTID;  Surgeon: Zachary KernsFields, Charles Ponce, Ponce;  [facility];  Service: Vascular;  Laterality: Left;  . KNEE SURGERY    .  LEFT HEART CATH AND CORONARY ANGIOGRAPHY N/A 03/03/2018   Procedure: LEFT HEART CATH AND CORONARY ANGIOGRAPHY;  Surgeon: Zachary Ponce;  Location: [facility] INVASIVE CV LAB;  Service: Cardiovascular;  Laterality: N/A;  . TEE WITHOUT CARDIOVERSION N/A 03/08/2018   Procedure: TRANSESOPHAGEAL ECHOCARDIOGRAM (TEE);  Surgeon: Zachary Ponce;  Location: [facility] OR;  Service: Open Heart Surgery;  Laterality: N/A;  . TONSILLECTOMY     Social History   Tobacco Use  Smoking Status Never Smoker  Smokeless Tobacco Never Used     Zachary Ponce has registered and begun the 12-week, twice weekly PREP at the [facility].    Zachary Ponce 10/08/2018, 10:42 PM

## 2018-10-09 ENCOUNTER — Ambulatory Visit: Payer: Medicare Other | Admitting: Family

## 2018-10-09 ENCOUNTER — Other Ambulatory Visit: Payer: Self-pay

## 2018-10-09 ENCOUNTER — Encounter (HOSPITAL_COMMUNITY): Payer: Medicare Other

## 2018-10-09 MED ORDER — ATORVASTATIN CALCIUM 20 MG PO TABS: 20.0000 mg | ORAL_TABLET | Freq: Every day | ORAL | 5 refills | Status: DC

## 2018-10-09 NOTE — Telephone Encounter (Signed)
Rx for atorvastatin 20mg one tablet daily sent to pharmacy as requested.  

## 2018-10-11 NOTE — Progress Notes (Signed)
[facility] PREP Progress Report   Patient Details  Name: Zachary Ponce MRN: [id number] Date of Birth: 1950/10/19 Age: 68 y.o. PCP: [name], MD  Vitals:   10/09/18 2004  Weight: 163 lb (73.9 kg)      Past Medical History:  Diagnosis Date  . ADHD (attention deficit hyperactivity disorder) 03/03/2018  . CAD (coronary artery disease) 03/03/2018   Cardiac cath  03/03/18 70% distal left main, 99% proximal LAD, 40-50% proximal circumflex, 60% intermediate, 90% ostial right coronary artery, 55% LVEF with some mild distal anterior hypokinesis  . Depression 06/30/2016  . Essential tremor   . History of traumatic head injury   . Hyperlipidemia 03/03/2018   Past Surgical History:  Procedure Laterality Date  . ANKLE SURGERY    . CORONARY ARTERY BYPASS GRAFT N/A 03/08/2018   Procedure: CORONARY ARTERY BYPASS GRAFTING (CABG) X4, RIGHT SAPHENOUS VEIN HARVEST. LIMA TO LAD, SVG TO OM, SVG TO RAMUS, SVG TO PD.;  Surgeon: [name], MD;  Location: [facility] OR;  Service: Open Heart Surgery;  Laterality: N/A;  . ENDARTERECTOMY Left 03/08/2018   Procedure: ENDARTERECTOMY CAROTID;  Surgeon: [name], MD;  Location: [facility] OR;  Service: Vascular;  Laterality: Left;  . KNEE SURGERY    . LEFT HEART CATH AND CORONARY ANGIOGRAPHY N/A 03/03/2018   Procedure: LEFT HEART CATH AND CORONARY ANGIOGRAPHY;  Surgeon: [name], MD;  [facility]: [facility] INVASIVE CV LAB;  Service: Cardiovascular;  Laterality: N/A;  . TEE WITHOUT CARDIOVERSION N/A 03/08/2018   Procedure: TRANSESOPHAGEAL ECHOCARDIOGRAM (TEE);  Surgeon: [name], [name], MD;  Location: [facility] OR;  Service: Open Heart Surgery;  Laterality: N/A;  . TONSILLECTOMY     Social History   Tobacco Use  Smoking Status Never Smoker  Smokeless Tobacco Never Used     Fun things you did since last meeting:"Movie, dinner out, walk w/friend" Things you are grateful for:"being grateful, more positive" Nutrition celebrations:"cooking halibut, eating more  healthy" Barriers:"prioritizing exercising"   Rose Fillers 10/11/2018, 8:05 PM

## 2018-10-16 ENCOUNTER — Ambulatory Visit (INDEPENDENT_AMBULATORY_CARE_PROVIDER_SITE_OTHER): Payer: Medicare Other | Admitting: Physician Assistant

## 2018-10-16 ENCOUNTER — Other Ambulatory Visit: Payer: Self-pay

## 2018-10-16 ENCOUNTER — Encounter: Payer: Self-pay | Admitting: Family

## 2018-10-16 VITALS — BP 94/58 | HR 61 | Temp 97.2°F | Resp 20 | Ht 70.0 in | Wt 163.0 lb

## 2018-10-16 DIAGNOSIS — I25119 Atherosclerotic heart disease of native coronary artery with unspecified angina pectoris: Secondary | ICD-10-CM | POA: Diagnosis not present

## 2018-10-16 NOTE — Progress Notes (Signed)
    Established Carotid Patient   History of Present Illness   Zachary Ponce is a 68 y.o. (07/31/1951) male who presents with carotid artery stenosis to go over carotid duplex.  He is status post combination CABG and left carotid endarterectomy with Dr. Maren Beach and Dr. Darrick Penna on 03/2018.  Preoperative duplex demonstrated bilateral critical carotid stenosis 80 to 99% by duplex.  Postoperatively right carotid duplex demonstrated only 60 to 79% by duplex.  He denies any strokelike symptoms including slurring speech, changes in vision, or one-sided weakness.  He is taking an aspirin and statin daily.    Current Outpatient Medications  Medication Sig Dispense Refill  . aspirin EC 81 MG EC tablet Take 1 tablet (81 mg total) by mouth daily.    [name] atorvastatin (LIPITOR) 20 MG tablet Take 1 tablet (20 mg total) by mouth daily at 6 PM. 30 tablet 5  . buPROPion (WELLBUTRIN XL) 300 MG 24 hr tablet Take 300 mg by mouth daily.    [name] escitalopram (LEXAPRO) 5 MG tablet Take 5 mg by mouth daily.  0  . NON FORMULARY Apply 50 mg topically daily. Testosterone 50mg /ml  0  . propranolol ER (INDERAL LA) 80 MG 24 hr capsule Take 80 mg by mouth daily.    [name] VYVANSE 30 MG capsule Take 30 mg by mouth daily.  0   No current facility-administered medications for this visit.       Physical Examination   Vitals:   10/16/18 1456 10/16/18 1459  BP: (!) 100/58 (!) 94/58  Pulse: 61   Resp: 20   Temp: (!) 97.2 F (36.2 C)   SpO2: 98%   Weight: 163 lb (73.9 kg)   Height: 5\' 10"  (1.778 m)    Body mass index is 23.39 kg/m.  General Alert, O x 3, WD, NAD  Neck Supple, mid-line trachea, left neck incision healed  Pulmonary Sym exp, good B air movt  Cardiac RRR, Nl S1, S2  Vascular Vessel Right Left  Radial Palpable Palpable  Carotid Palpable, No Bruit Palpable, No Bruit  Aorta Not palpable N/A    Gastro- intestinal soft, non-distended, non-tender to palpation,   Musculo- skeletal M/S 5/5 throughout  ,  Extremities without ischemic changes    Neurologic Cranial nerves 2-12 intact    Non-Invasive Vascular Imaging   B Carotid Duplex (10/16/18):   R ICA stenosis:  60-79%  R VA:  patent and antegrade  L ICA stenosis:  1-39%  L VA:  patent and antegrade   Medical Decision Making   Zachary Ponce is a 68 y.o. male who presents with carotid artery stenosis status post combination left CEA and CABG   Repeat carotid duplex again demonstrates right ICA stenosis at 60 to 79%  Surgical site estimated to be 1 to 39% stenosis  Right ICA stenosis is asymptomatic, no clear indication to proceed with revascularization of right ICA at this time  Continue aspirin and statin daily  Recheck carotid duplex in 6 months   Emilie Rutter PA-C [facility] Office: [phone]  Clinic MD: Dr. Myra Gianotti

## 2018-10-17 NOTE — Progress Notes (Signed)
[facility] PREP Weekly Session   Patient Details  Name: Zachary Ponce MRN: [id number] Date of Birth: [birth year] Age: 68 y.o. PCP: [name], MD  Vitals:   10/16/18 2108  Weight: 163 lb (73.9 kg)    [facility] Weekly seesion - 10/17/18 2100      Weekly Session   Topic Discussed  Healthy eating tips    Minutes exercised this week  175 minutes   120cardio/strength25/47flexibility     Fun things you did since last meeting:"washed a car" Things you are grateful for:"Health good per Vascular Dr report" Nutrition celebrations:"Core Life" Barriers:"Rain"   Rose Fillers 10/17/2018, 9:09 PM

## 2018-10-24 ENCOUNTER — Telehealth: Payer: Self-pay | Admitting: Cardiology

## 2018-10-24 NOTE — Telephone Encounter (Signed)
Dr. Dulce Sellar advised patient can do the purification program that involves a lot of smoothies, water, and fruit for 21 days. Patient verbalized understanding.   Patient states he has lost 20 pounds since his CABG and wants to know if Dr. Dulce Sellar has any recommendations as to how to "beef up." Will have Dr. Dulce Sellar advise.

## 2018-10-24 NOTE — Telephone Encounter (Signed)
Wants to know if it's ok for him to go on a 21 day verification program

## 2018-10-25 NOTE — Progress Notes (Signed)
[facility] PREP Weekly Session   Patient Details  Name: Zachary Ponce MRN: [id number] Date of Birth: 31-Jul-1951 Age: 68 y.o. PCP: [name], MD  Vitals:   10/24/18 2001  Weight: 162 lb (73.5 kg)    [facility] Weekly seesion - 10/25/18 2000      Weekly Session   Topic Discussed  Health habits    Minutes exercised this week  135 minutes   75cardio/40strength/103flexibility     Fun things you did since last meeting:'my birthday" Things you are grateful for:"health" Nutrition celebrations:"preparing for the 21 day cleanse" Barriers:"Time and other responsibilities"  Rose Fillers 10/25/2018, 8:02 PM

## 2018-11-23 ENCOUNTER — Telehealth: Payer: Self-pay | Admitting: *Deleted

## 2018-11-23 NOTE — Telephone Encounter (Signed)
Pt phoned to speak with Dr. Dulce Sellar about feeling dizzy and saying BP has been elevated. Device readings have been 135/70, 125/65, 142/73 and this just now was 117/60. BP device display is saying that BP is elevated. I let pt know that these bp readings are not concerning but that a nurse would call him back to advise.

## 2018-11-23 NOTE — Telephone Encounter (Signed)
Patient states that his systolic blood pressure is normally around 110. Patient is concerned because his BP has been higher than it normally is (see readings below). Explained to patient to continue to monitor his BP and contact our office if his readings continue to increase. Patient denies any symptoms at this time; however, he reports lightheaded and dizziness when getting out of the bed in the middle of the night to use the bathroom. Advised patient to allow additional time with each position change in order to allow his blood pressure to normalize and this should improve his symptoms. Patient verbalized understanding. No further questions.

## 2019-03-06 ENCOUNTER — Other Ambulatory Visit: Payer: Self-pay

## 2019-03-06 DIAGNOSIS — I6523 Occlusion and stenosis of bilateral carotid arteries: Secondary | ICD-10-CM

## 2019-03-06 NOTE — Progress Notes (Signed)
Cardiology Office Note:    Date:  03/07/2019   ID:  Zachary Ponce, DOB 06-21-1951, MRN [id number]  PCP:  Shirline Frees, MD  Cardiologist:  Shirlee More, MD    Referring MD: Shirline Frees, MD    ASSESSMENT:    1. Coronary artery disease involving native coronary artery of native heart with angina pectoris ([place])   2. Bilateral carotid artery stenosis   3. Hyperlipidemia, unspecified hyperlipidemia type   4. PAF (paroxysmal atrial fibrillation) (HCC)    PLAN:    In order of problems listed above:  1. Stable CAD New York Heart Association class I continue treatment including aspirin beta-blocker statin 2. He follows closely vascular surgery to be seen tomorrow continue medical treatment with aspirin and statin 3. Stable lipids are ideal continue statin check liver function lipid profile today 4. Stable no clinical recurrence after bypass surgery   Next appointment: 6 months   Medication Adjustments/Labs and Tests Ordered: Current medicines are reviewed at length with the patient today.  Concerns regarding medicines are outlined above.  No orders of the defined types were placed in this encounter.  No orders of the defined types were placed in this encounter.   No chief complaint on file. 68 yo male presents for 6 month follow up of cardiac conditions including CAD, HLD, and bilateral carotid artery stenosis.   History of Present Illness:    Zachary Ponce is a 68 y.o. male with a hx of CAD s/p CABGx4 03/08/18, HLD, bilateral carotid artery stenosis s/p L carotid endarterectomy 03/09/18 last seen 09/08/2018. Also noted history PAF - Eliquis was discontinued 09/18/18 after event monitor without recurrent atrial fibrillation.  He follows with [facility] and has carotid ultrasound upcoming this month.  Most recent lipid panel 09/18/08 with total cholesterol 93, HDL 36, LDL 43, Triglycerides 69. Normal kidney function, liver function, electrolytes at that time.   He has a  a history of  traumatic brain injury with vertigo and continues to have episodes of the positional he is worried about the need for further carotid revascularization will be seen by vascular surgery tomorrow no angina dyspnea palpitation or syncope tolerates his medications including statin without muscle symptoms Compliance with diet, lifestyle and medications:y es Past Medical History:  Diagnosis Date  . ADHD (attention deficit hyperactivity disorder) 03/03/2018  . CAD (coronary artery disease) 03/03/2018   Cardiac cath  03/03/18 70% distal left main, 99% proximal LAD, 40-50% proximal circumflex, 60% intermediate, 90% ostial right coronary artery, 55% LVEF with some mild distal anterior hypokinesis  . Depression 06/30/2016  . Essential tremor   . History of traumatic head injury   . Hyperlipidemia 03/03/2018    Past Surgical History:  Procedure Laterality Date  . ANKLE SURGERY    . CORONARY ARTERY BYPASS GRAFT N/A 03/08/2018   Procedure: CORONARY ARTERY BYPASS GRAFTING (CABG) X4, RIGHT SAPHENOUS VEIN HARVEST. LIMA TO LAD, SVG TO OM, SVG TO RAMUS, SVG TO PD.;  Surgeon: Ivin Poot, MD;  Location: [place];  Service: Open Heart Surgery;  Laterality: N/A;  . ENDARTERECTOMY Left 03/08/2018   Procedure: ENDARTERECTOMY CAROTID;  Surgeon: Elam Dutch, MD;  Location: [place];  Service: Vascular;  Laterality: Left;  . KNEE SURGERY    . LEFT HEART CATH AND CORONARY ANGIOGRAPHY N/A 03/03/2018   Procedure: LEFT HEART CATH AND CORONARY ANGIOGRAPHY;  Surgeon: Troy Sine, MD;  Location: [place] CV LAB;  Service: Cardiovascular;  Laterality: N/A;  . TEE WITHOUT CARDIOVERSION N/A 03/08/2018  Procedure: TRANSESOPHAGEAL ECHOCARDIOGRAM (TEE);  Surgeon: Donata ClayVan Trigt, Theron AristaPeter, MD;  [facility];  Service: Open Heart Surgery;  Laterality: N/A;  . TONSILLECTOMY      Current Medications: No outpatient medications have been marked as taking for the 03/07/19 encounter (Office Visit) with [name], [name], MD.      Allergies:   Patient has no known allergies.   Social History   Socioeconomic History  . Marital status: Married    Spouse name: Not on file  . Number of children: Not on file  . Years of education: Not on file  . Highest education level: Not on file  Occupational History  . Not on file  Social Needs  . Financial resource strain: Not on file  . Food insecurity    Worry: Not on file    Inability: Not on file  . Transportation needs    Medical: Not on file    Non-medical: Not on file  Tobacco Use  . Smoking status: Never Smoker  . Smokeless tobacco: Never Used  Substance and Sexual Activity  . Alcohol use: Not Currently  . Drug use: Never  . Sexual activity: Yes  Lifestyle  . Physical activity    Days per week: Not on file    Minutes per session: Not on file  . Stress: Not on file  Relationships  . Social Musicianconnections    Talks on phone: Not on file    Gets together: Not on file    Attends religious service: Not on file    Active member of club or organization: Not on file    Attends meetings of clubs or organizations: Not on file    Relationship status: Not on file  Other Topics Concern  . Not on file  Social History Narrative  . Not on file     Family History: The patient's family history includes Diabetes in his brother; Lung cancer in his mother. ROS:   Please see the history of present illness.    All other systems reviewed and are negative.  EKGs/Labs/Other Studies Reviewed:    The following studies were reviewed today:  09/22/18 Carotid Duplex  Right Carotid: Velocities in the right ICA are consistent with a 60-79% stenosis.  Left Carotid: Velocities in the left ICA are consistent with a 1-39% stenosis.  Vertebrals:  Bilateral vertebral arteries demonstrate antegrade flow  Subclavians: Normal flow hemodynamics were seen in bilateral subclavian arteries.  Cardiac cath 03/03/18 70% distal left main, 99% proximal LAD, 40-50% proximal circumflex, 60%  intermediate, 90% ostial right coronary artery, 55% LVEF with some mild distal anterior hypokinesis   Recent Labs: 03/09/2018: Magnesium 2.5 03/14/2018: Hemoglobin 10.3; Platelets 281 09/18/2018: ALT 25; BUN 20; Creatinine, Ser 1.01; Potassium 4.6; Sodium 138  Recent Lipid Panel    Component Value Date/Time   CHOL 93 (L) 09/18/2018 1526   TRIG 69 09/18/2018 1526   HDL 36 (L) 09/18/2018 1526   CHOLHDL 2.6 09/18/2018 1526   LDLCALC 43 09/18/2018 1526    Physical Exam:    VS:  BP (!) 118/58 (BP Location: Right Arm, Patient Position: Sitting, Cuff Size: Normal)   Pulse 63   Temp (!) 97.3 F (36.3 C)   Ht 5\' 10"  (1.778 m)   Wt 164 lb (74.4 kg)   SpO2 98%   BMI 23.53 kg/m     Wt Readings from Last 3 Encounters:  03/07/19 164 lb (74.4 kg)  10/24/18 162 lb (73.5 kg)  10/16/18 163 lb (73.9 kg)  GEN:  Well nourished, well developed in no acute distress HEENT: Normal NECK: No JVD; No carotid bruits LYMPHATICS: No lymphadenopathy CARDIAC: RRR, no murmurs, rubs, gallops RESPIRATORY:  Clear to auscultation without rales, wheezing or rhonchi  ABDOMEN: Soft, non-tender, non-distended MUSCULOSKELETAL:  No edema; No deformity  SKIN: Warm and dry NEUROLOGIC:  Alert and oriented x 3 PSYCHIATRIC:  Normal affect    Signed, Norman HerrlichBrian Navi Erber, MD  03/07/2019 2:06 PM    [facility]

## 2019-03-07 ENCOUNTER — Encounter: Payer: Self-pay | Admitting: Cardiology

## 2019-03-07 ENCOUNTER — Ambulatory Visit (INDEPENDENT_AMBULATORY_CARE_PROVIDER_SITE_OTHER): Payer: Medicare Other | Admitting: Cardiology

## 2019-03-07 ENCOUNTER — Other Ambulatory Visit: Payer: Self-pay

## 2019-03-07 VITALS — BP 118/58 | HR 63 | Temp 97.3°F | Ht 70.0 in | Wt 164.0 lb

## 2019-03-07 DIAGNOSIS — E785 Hyperlipidemia, unspecified: Secondary | ICD-10-CM | POA: Diagnosis not present

## 2019-03-07 DIAGNOSIS — I6523 Occlusion and stenosis of bilateral carotid arteries: Secondary | ICD-10-CM | POA: Diagnosis not present

## 2019-03-07 DIAGNOSIS — I480 Paroxysmal atrial fibrillation: Secondary | ICD-10-CM | POA: Diagnosis not present

## 2019-03-07 DIAGNOSIS — I25119 Atherosclerotic heart disease of native coronary artery with unspecified angina pectoris: Secondary | ICD-10-CM

## 2019-03-07 NOTE — Patient Instructions (Signed)
Medication Instructions:  Your physician recommends that you continue on your current medications as directed. Please refer to the Current Medication list given to you today.  If you need a refill on your cardiac medications before your next appointment, please call your pharmacy.   Lab work: Your physician recommends that you return for lab work today: CMP, lipid panel.   If you have labs (blood work) drawn today and your tests are completely normal, you will receive your results only by: [name] MyChart Message (if you have MyChart) OR . A paper copy in the mail If you have any lab test that is abnormal or we need to change your treatment, we will call you to review the results.  Testing/Procedures: None  Follow-Up: At [facility], you and your health needs are our priority.  As part of our continuing mission to provide you with exceptional heart care, we have created designated Provider Care Teams.  These Care Teams include your primary Cardiologist (physician) and Advanced Practice Providers (APPs -  Physician Assistants and Nurse Practitioners) who all work together to provide you with the care you need, when you need it. You will need a follow up appointment in 6 months.

## 2019-03-08 ENCOUNTER — Telehealth: Payer: Self-pay | Admitting: *Deleted

## 2019-03-08 LAB — COMPREHENSIVE METABOLIC PANEL
ALT: 32 IU/L (ref 0–44)
AST: 26 IU/L (ref 0–40)
Albumin/Globulin Ratio: 2.2 (ref 1.2–2.2)
Albumin: 4.2 g/dL (ref 3.8–4.8)
Alkaline Phosphatase: 112 IU/L (ref 39–117)
BUN/Creatinine Ratio: 23 (ref 10–24)
BUN: 25 mg/dL (ref 8–27)
Bilirubin Total: 0.3 mg/dL (ref 0.0–1.2)
CO2: 21 mmol/L (ref 20–29)
Calcium: 9.2 mg/dL (ref 8.6–10.2)
Chloride: 104 mmol/L (ref 96–106)
Creatinine, Ser: 1.1 mg/dL (ref 0.76–1.27)
GFR calc Af Amer: 79 mL/min/{1.73_m2} (ref >59)
GFR calc non Af Amer: 69 mL/min/{1.73_m2} (ref >59)
Globulin, Total: 1.9 g/dL (ref 1.5–4.5)
Glucose: 100 mg/dL — ABNORMAL HIGH (ref 65–99)
Potassium: 4.9 mmol/L (ref 3.5–5.2)
Sodium: 142 mmol/L (ref 134–144)
Total Protein: 6.1 g/dL (ref 6.0–8.5)

## 2019-03-08 LAB — LIPID PANEL
Chol/HDL Ratio: 2.7 ratio (ref 0.0–5.0)
Cholesterol, Total: 99 mg/dL — ABNORMAL LOW (ref 100–199)
HDL: 37 mg/dL — ABNORMAL LOW (ref >39)
LDL Calculated: 40 mg/dL (ref 0–99)
Triglycerides: 111 mg/dL (ref 0–149)
VLDL Cholesterol Cal: 22 mg/dL (ref 5–40)

## 2019-03-08 NOTE — Telephone Encounter (Signed)
Telephone call to patient. Left message that labs were normal/stable with no changes in treatment at this time. Instructed to call with any questions.

## 2019-03-08 NOTE — Telephone Encounter (Signed)
-----   Message from Richardo Priest, MD sent at 03/08/2019  8:00 AM EDT ----- Normal or stable result  No changes

## 2019-03-12 ENCOUNTER — Ambulatory Visit (HOSPITAL_COMMUNITY)
Admission: RE | Admit: 2019-03-12 | Discharge: 2019-03-12 | Disposition: A | Discharge status: Home / Self Care | Payer: Medicare Other | Source: Ambulatory Visit | Attending: Family | Admitting: Family

## 2019-03-12 ENCOUNTER — Other Ambulatory Visit: Payer: Self-pay

## 2019-03-12 ENCOUNTER — Encounter: Payer: Self-pay | Admitting: Family

## 2019-03-12 ENCOUNTER — Ambulatory Visit: Payer: Medicare Other | Admitting: Family

## 2019-03-12 VITALS — BP 115/64 | HR 53 | Temp 97.5°F | Resp 18 | Ht 70.0 in | Wt 165.0 lb

## 2019-03-12 DIAGNOSIS — I6523 Occlusion and stenosis of bilateral carotid arteries: Secondary | ICD-10-CM | POA: Diagnosis not present

## 2019-03-12 DIAGNOSIS — Z9889 Other specified postprocedural states: Secondary | ICD-10-CM
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
## 2019-03-12 NOTE — Patient Instructions (Signed)

## 2019-04-13 ENCOUNTER — Other Ambulatory Visit: Payer: Self-pay | Admitting: Cardiology

## 2019-04-16 ENCOUNTER — Encounter (HOSPITAL_COMMUNITY): Payer: Medicare Other

## 2019-04-16 ENCOUNTER — Ambulatory Visit: Payer: Medicare Other | Admitting: Family

## 2019-04-16 NOTE — Telephone Encounter (Signed)
Atorvastatin refill sent to [facility] [phone] per request

## 2019-04-23 ENCOUNTER — Telehealth: Payer: Self-pay | Admitting: Cardiology

## 2019-04-23 ENCOUNTER — Other Ambulatory Visit: Payer: Self-pay | Admitting: *Deleted

## 2019-04-23 MED ORDER — PROPRANOLOL HCL ER 80 MG PO CP24: 80.0000 mg | ORAL_CAPSULE | Freq: Every day | ORAL | 1 refills | Status: DC

## 2019-04-23 NOTE — Telephone Encounter (Signed)
°*  STAT* If patient is at the pharmacy, call can be transferred to refill team.   1. Which medications need to be refilled? (please list name of each medication and dose if known) propranolol ER (INDERAL LA) 80 MG 24 hr capsule   2. Which pharmacy/location (including street and city if local pharmacy) is medication to be sent to?  [place], [address] [phone] (Phone) [phone] (Fax)    3. Do they need a 30 day or 90 day supply? 90 day

## 2019-04-23 NOTE — Telephone Encounter (Signed)
Refill sent.

## 2019-08-15 ENCOUNTER — Other Ambulatory Visit: Payer: Self-pay | Admitting: Cardiology

## 2019-08-20 NOTE — Progress Notes (Signed)
Cardiology Office Note:    Date:  08/20/2019   ID:  [name], DOB 1950/10/18, MRN [id number]  PCP:  [name], MD  Cardiologist:  [name], MD    Referring MD: [name], MD    ASSESSMENT:    No diagnosis found. PLAN:    In order of problems listed above:  1. CAD, stable continue medical treatment at this time would not advise an ischemia evaluation 2. Carotid disease stable followed by vascular surgery 3. Paroxysmal atrial fibrillation stable no recurrence he is no longer anticoagulated 4. Hyperlipidemia stable continue statin check liver function lipid profile 5. Tremor continue beta-blocker can increase back to 80 mg/day of Inderal LA   Next appointment: 6 months   Medication Adjustments/Labs and Tests Ordered: Current medicines are reviewed at length with the patient today.  Concerns regarding medicines are outlined above.  No orders of the defined types were placed in this encounter.  No orders of the defined types were placed in this encounter.   No chief complaint on file.   History of Present Illness:    Zachary Ponce is a 68 y.o. male with a hx of CAD s/p CABGx4 03/08/18, HLD, bilateral carotid artery stenosis s/p L carotid endarterectomy 03/09/18 last seen 09/08/2018. Also noted history PAF - Eliquis was discontinued 09/18/18 after event monitor without recurrent atrial fibrillation.  He was last seen 03/07/2019. Compliance with diet, lifestyle and medications: Yes  His predominant concern is his intention tremor he wants to increase the dose of his Inderal back 4 months I told him it is fine was his not having document of bradycardia at home continue to monitor his heart rhythm and contact us if greater than 50.  No TIA palpitation chest pain shortness of breath or edema.  Due for labs we will check CMP lipid profile today Past Medical History:  Diagnosis Date  . ADHD (attention deficit hyperactivity disorder) 03/03/2018  . CAD (coronary  artery disease) 03/03/2018   Cardiac cath  03/03/18 70% distal left main, 99% proximal LAD, 40-50% proximal circumflex, 60% intermediate, 90% ostial right coronary artery, 55% LVEF with some mild distal anterior hypokinesis  . Depression 06/30/2016  . Essential tremor   . History of traumatic head injury   . Hyperlipidemia 03/03/2018    Past Surgical History:  Procedure Laterality Date  . ANKLE SURGERY    . CORONARY ARTERY BYPASS GRAFT N/A 03/08/2018   Procedure: CORONARY ARTERY BYPASS GRAFTING (CABG) X4, RIGHT SAPHENOUS VEIN HARVEST. LIMA TO LAD, SVG TO OM, SVG TO RAMUS, SVG TO PD.;  Surgeon: [name], MD;  Location: [facility] OR;  Service: Open Heart Surgery;  Laterality: N/A;  . ENDARTERECTOMY Left 03/08/2018   Procedure: ENDARTERECTOMY CAROTID;  Surgeon: [name], MD;  Location: [facility] OR;  Service: Vascular;  Laterality: Left;  . KNEE SURGERY    . LEFT HEART CATH AND CORONARY ANGIOGRAPHY N/A 03/03/2018   Procedure: LEFT HEART CATH AND CORONARY ANGIOGRAPHY;  Surgeon: [name], MD;  Location: [facility] INVASIVE CV LAB;  Service: Cardiovascular;  Laterality: N/A;  . TEE WITHOUT CARDIOVERSION N/A 03/08/2018   Procedure: TRANSESOPHAGEAL ECHOCARDIOGRAM (TEE);  Surgeon: [name], [name], MD;  Location: [facility] OR;  Service: Open Heart Surgery;  Laterality: N/A;  . TONSILLECTOMY      Current Medications: No outpatient medications have been marked as taking for the 08/21/19 encounter (Appointment) with [name], MD.     Allergies:   Patient has no known allergies.  Social History   Socioeconomic History  . Marital status: Married    Spouse name: Not on file  . Number of children: Not on file  . Years of education: Not on file  . Highest education level: Not on file  Occupational History  . Not on file  Tobacco Use  . Smoking status: Never Smoker  . Smokeless tobacco: Never Used  Substance and Sexual Activity  . Alcohol use: Not Currently  . Drug use: Never  . Sexual activity:  Yes  Other Topics Concern  . Not on file  Social History Narrative  . Not on file   Social Determinants of Health   Financial Resource Strain:   . Difficulty of Paying Living Expenses: Not on file  Food Insecurity:   . Worried About Charity fundraiser in the Last Year: Not on file  . Ran Out of Food in the Last Year: Not on file  Transportation Needs:   . Lack of Transportation (Medical): Not on file  . Lack of Transportation (Non-Medical): Not on file  Physical Activity:   . Days of Exercise per Week: Not on file  . Minutes of Exercise per Session: Not on file  Stress:   . Feeling of Stress : Not on file  Social Connections:   . Frequency of Communication with Friends and Family: Not on file  . Frequency of Social Gatherings with Friends and Family: Not on file  . Attends Religious Services: Not on file  . Active Member of Clubs or Organizations: Not on file  . Attends Archivist Meetings: Not on file  . Marital Status: Not on file     Family History: The patient's family history includes Diabetes in his brother; Lung cancer in his mother. ROS:   Please see the history of present illness.    All other systems reviewed and are negative.  EKGs/Labs/Other Studies Reviewed:    The following studies were reviewed today:   Recent Labs: 03/07/2019: ALT 32; BUN 25; Creatinine, Ser 1.10; Potassium 4.9; Sodium 142  Recent Lipid Panel    Component Value Date/Time   CHOL 99 (L) 03/07/2019 1446   TRIG 111 03/07/2019 1446   HDL 37 (L) 03/07/2019 1446   CHOLHDL 2.7 03/07/2019 1446   LDLCALC 40 03/07/2019 1446    Physical Exam:    VS:  There were no vitals taken for this visit.    Wt Readings from Last 3 Encounters:  03/12/19 165 lb (74.8 kg)  03/07/19 164 lb (74.4 kg)  10/24/18 162 lb (73.5 kg)     GEN:  Well nourished, well developed in no acute distress HEENT: Normal NECK: No JVD; No carotid bruits LYMPHATICS: No lymphadenopathy CARDIAC: RRR, no  murmurs, rubs, gallops RESPIRATORY:  Clear to auscultation without rales, wheezing or rhonchi  ABDOMEN: Soft, non-tender, non-distended MUSCULOSKELETAL:  No edema; No deformity  SKIN: Warm and dry NEUROLOGIC:  Alert and oriented x 3 PSYCHIATRIC:  Normal affect    Signed, Shirlee More, MD  08/20/2019 10:59 AM    [facility]

## 2019-08-21 ENCOUNTER — Other Ambulatory Visit: Payer: Self-pay

## 2019-08-21 ENCOUNTER — Encounter: Payer: Self-pay | Admitting: Cardiology

## 2019-08-21 ENCOUNTER — Ambulatory Visit (INDEPENDENT_AMBULATORY_CARE_PROVIDER_SITE_OTHER): Payer: Medicare Other | Admitting: Cardiology

## 2019-08-21 VITALS — BP 104/68 | HR 60 | Ht 70.0 in | Wt 169.0 lb

## 2019-08-21 DIAGNOSIS — I480 Paroxysmal atrial fibrillation: Secondary | ICD-10-CM | POA: Diagnosis not present

## 2019-08-21 DIAGNOSIS — E785 Hyperlipidemia, unspecified: Secondary | ICD-10-CM

## 2019-08-21 DIAGNOSIS — Z951 Presence of aortocoronary bypass graft: Secondary | ICD-10-CM | POA: Diagnosis not present

## 2019-08-21 MED ORDER — PROPRANOLOL HCL ER 80 MG PO CP24: 80.0000 mg | ORAL_CAPSULE | Freq: Every day | ORAL | 1 refills | Status: DC

## 2019-08-21 NOTE — Patient Instructions (Signed)
Medication Instructions:  Your physician recommends that you continue on your current medications as directed. Please refer to the Current Medication list given to you today.  *If you need a refill on your cardiac medications before your next appointment, please call your pharmacy*  Lab Work: Your physician recommends that you return for lab work today: CMP, lipid panel.  If you have labs (blood work) drawn today and your tests are completely normal, you will receive your results only by: . MyChart Message (if you have MyChart) OR . A paper copy in the mail If you have any lab test that is abnormal or we need to change your treatment, we will call you to review the results.  Testing/Procedures: You had an EKG today.   Follow-Up: At [facility], you and your health needs are our priority.  As part of our continuing mission to provide you with exceptional heart care, we have created designated Provider Care Teams.  These Care Teams include your primary Cardiologist (physician) and Advanced Practice Providers (APPs -  Physician Assistants and Nurse Practitioners) who all work together to provide you with the care you need, when you need it.  Your next appointment:   6 months  The format for your next appointment:   In Person  Provider:   Brian Munley, MD    

## 2019-08-22 LAB — COMPREHENSIVE METABOLIC PANEL
ALT: 28 IU/L (ref 0–44)
AST: 25 IU/L (ref 0–40)
Albumin/Globulin Ratio: 1.6 (ref 1.2–2.2)
Albumin: 4 g/dL (ref 3.8–4.8)
Alkaline Phosphatase: 101 IU/L (ref 39–117)
BUN/Creatinine Ratio: 24 (ref 10–24)
BUN: 24 mg/dL (ref 8–27)
Bilirubin Total: 0.4 mg/dL (ref 0.0–1.2)
CO2: 26 mmol/L (ref 20–29)
Calcium: 9.2 mg/dL (ref 8.6–10.2)
Chloride: 101 mmol/L (ref 96–106)
Creatinine, Ser: 1.01 mg/dL (ref 0.76–1.27)
GFR calc Af Amer: 88 mL/min/{1.73_m2} (ref >59)
GFR calc non Af Amer: 76 mL/min/{1.73_m2} (ref >59)
Globulin, Total: 2.5 g/dL (ref 1.5–4.5)
Glucose: 93 mg/dL (ref 65–99)
Potassium: 4.8 mmol/L (ref 3.5–5.2)
Sodium: 139 mmol/L (ref 134–144)
Total Protein: 6.5 g/dL (ref 6.0–8.5)

## 2019-08-22 LAB — LIPID PANEL
Chol/HDL Ratio: 2.8 ratio (ref 0.0–5.0)
Cholesterol, Total: 106 mg/dL (ref 100–199)
HDL: 38 mg/dL — ABNORMAL LOW (ref >39)
LDL Chol Calc (NIH): 49 mg/dL (ref 0–99)
Triglycerides: 102 mg/dL (ref 0–149)
VLDL Cholesterol Cal: 19 mg/dL (ref 5–40)

## 2019-08-27 ENCOUNTER — Ambulatory Visit: Payer: Medicare Other | Admitting: Cardiology
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
## 2019-10-17 ENCOUNTER — Other Ambulatory Visit: Payer: Self-pay

## 2019-10-17 ENCOUNTER — Ambulatory Visit: Payer: Medicare PPO | Admitting: Physician Assistant

## 2019-10-17 ENCOUNTER — Ambulatory Visit (HOSPITAL_COMMUNITY)
Admission: RE | Admit: 2019-10-17 | Discharge: 2019-10-17 | Disposition: A | Discharge status: Home / Self Care | Payer: Medicare PPO | Source: Ambulatory Visit | Attending: Surgery | Admitting: Surgery

## 2019-10-17 VITALS — BP 126/70 | HR 62 | Ht 70.0 in | Wt 164.0 lb

## 2019-10-17 DIAGNOSIS — I6523 Occlusion and stenosis of bilateral carotid arteries: Secondary | ICD-10-CM

## 2019-10-18 ENCOUNTER — Other Ambulatory Visit: Payer: Self-pay | Admitting: *Deleted

## 2019-10-18 DIAGNOSIS — I6523 Occlusion and stenosis of bilateral carotid arteries: Secondary | ICD-10-CM

## 2019-10-27 ENCOUNTER — Ambulatory Visit: Payer: Medicare PPO | Attending: Internal Medicine

## 2019-10-27 DIAGNOSIS — Z23 Encounter for immunization: Secondary | ICD-10-CM | POA: Insufficient documentation

## 2019-10-27 NOTE — Progress Notes (Signed)
   Covid-19 Vaccination Clinic  Name:  COULTER OLDAKER    MRN: [id number] DOB: 02/23/1951  10/27/2019  Mr. Parham was observed post Covid-19 immunization for 15 minutes without incidence. He was provided with Vaccine Information Sheet and instruction to access the V-Safe system.   Mr. Hinks was instructed to call 911 with any severe reactions post vaccine: [name] Difficulty breathing  . Swelling of your face and throat  . A fast heartbeat  . A bad rash all over your body  . Dizziness and weakness    Immunizations Administered    Name Date Dose VIS Date Route   Pfizer COVID-19 Vaccine 10/27/2019  9:30 AM 0.3 mL 08/17/2019 Intramuscular   Manufacturer: [employer], [employer]   Lot: [id number]   NDC: [address]-5

## 2019-11-20 ENCOUNTER — Ambulatory Visit: Payer: Medicare PPO | Attending: Internal Medicine

## 2019-11-20 DIAGNOSIS — Z23 Encounter for immunization: Secondary | ICD-10-CM

## 2019-11-20 NOTE — Progress Notes (Signed)
   Covid-19 Vaccination Clinic  Name:  Zachary Ponce    MRN: [id number] DOB: 12-01-1950  11/20/2019  [name] was observed post Covid-19 immunization for 15 minutes without incident. He was provided with Vaccine Information Sheet and instruction to access the V-Safe system.   [name] was instructed to call 911 with any severe reactions post vaccine: [name] Difficulty breathing  . Swelling of face and throat  . A fast heartbeat  . A bad rash all over body  . Dizziness and weakness   Immunizations Administered    Name Date Dose VIS Date Route   Pfizer COVID-19 Vaccine 11/20/2019 10:11 AM 0.3 mL 08/17/2019 Intramuscular   Manufacturer: [employer], [employer]   Lot: [id number]   NDC: [address]-2

## 2019-12-06 DIAGNOSIS — G459 Transient cerebral ischemic attack, unspecified: Secondary | ICD-10-CM | POA: Insufficient documentation

## 2019-12-06 HISTORY — DX: Transient cerebral ischemic attack, unspecified: G45.9

## 2020-01-02 ENCOUNTER — Telehealth: Payer: Self-pay | Admitting: Cardiology

## 2020-01-02 NOTE — Telephone Encounter (Signed)
Hey, I just spoke with Dr. Dulce Sellar and he said he would advise to send him to his PCP since he does not have any openings sooner.

## 2020-01-02 NOTE — Telephone Encounter (Signed)
Patient states he is experiencing vertigo. He has dizziness and cold sweats. Denies any other symptoms. He wants to see Dr. Dulce Sellar in [place] but there are no openings until June. Please advise. He is not willing to go to [place].

## 2020-01-02 NOTE — Telephone Encounter (Signed)
LMTCB

## 2020-01-03 NOTE — Telephone Encounter (Signed)
I discussed this patient with Dr. Dulce Sellar and he recommended that the patient be seen by his primary care doctor. When speaking with the patient just now he tells me that his symptoms have resolved and he does think that he had a severe panic attack yesterday. I let him know that Dr. Dulce Sellar Is recommending that he see his PCP as soon as possible. The patient also let me know that he was trying to get in touch with his psychiatrist to get an appointment with him as well. No other issues or concerns were noted.    Encouraged patient to call back with any questions or concerns.

## 2020-01-03 NOTE — Telephone Encounter (Signed)
Zachary Ponce is calling back in regards to what was previously discussed yesterday. He states he wanted to make Dr. Dulce Sellar aware and see what he advises in regards to it. Two days prior to yesterday Zachary Ponce reports experiencing cold sweats. Yesterday after getting a haircut he was going to go on a walk with a friend, but started feeling dizzy so he went and laid down instead. Once Zachary Ponce laid down he began experiencing vertigo and SOB, so he called wife due to panicking. Zachary Ponce states his right arm went completely numb and he couldn't move. His son then came over and the EMT's were called. His BP was up to 165 over something once they got there and it's normally 115 over something. Zachary Ponce threw up a lot and states that helped, the EMT's advised him it seemed like food poisoning, but he thinks it was a severe panic attack. Please advise.

## 2020-01-15 ENCOUNTER — Telehealth: Payer: Self-pay

## 2020-01-15 NOTE — Telephone Encounter (Signed)
Pt called triage line today after having experienced what he has been told by his psychologist was a possible TIA. He experienced R arm stiffness, dizziness and nausea. Per PA, he is going to come in this week for a carotid duplex and appt with [facility]. Neysa Bonito will call pt to provide date/time.

## 2020-01-16 ENCOUNTER — Other Ambulatory Visit: Payer: Self-pay

## 2020-01-16 ENCOUNTER — Ambulatory Visit: Payer: Medicare PPO | Admitting: Vascular Surgery

## 2020-01-16 ENCOUNTER — Encounter: Payer: Self-pay | Admitting: Vascular Surgery

## 2020-01-16 ENCOUNTER — Ambulatory Visit (HOSPITAL_COMMUNITY)
Admission: RE | Admit: 2020-01-16 | Discharge: 2020-01-16 | Disposition: A | Discharge status: Home / Self Care | Payer: Medicare PPO | Source: Ambulatory Visit | Attending: Surgery | Admitting: Surgery

## 2020-01-16 VITALS — BP 144/69 | HR 56 | Temp 98.0°F | Resp 16 | Ht 70.0 in | Wt 158.0 lb

## 2020-01-16 DIAGNOSIS — I6523 Occlusion and stenosis of bilateral carotid arteries: Secondary | ICD-10-CM

## 2020-01-16 DIAGNOSIS — R29898 Other symptoms and signs involving the musculoskeletal system: Secondary | ICD-10-CM

## 2020-01-16 NOTE — Progress Notes (Signed)
Patient name: Zachary Ponce MRN: [id number] DOB: 03-04-1951 Sex: male  HPI: Zachary Ponce is a 69 y.o. male, who previously underwent left carotid endarterectomy 2019.  This was combined with coronary artery bypass grafting.  He has not had any events of TIA amaurosis or stroke since that time.  He was last seen in our office February 2021.  He had no symptoms at that point.  On April 25 he began to have an episode of diaphoresis.  This passed after a few minutes.  3 days later he began to have diaphoresis once again with some nausea and dizziness.  He called EMS at that time and states that his blood pressure was 160 systolic.  He apparently had a normal EKG.  He states that also at the same time his right arm was not moving correctly.  This lasted about 5 to 10 minutes.  He has not had any further events.  He has had normal return of function in his right arm.  He does not describe any amaurosis.  He is on aspirin and a statin.  Other medical problems include anxiety hyperlipidemia coronary artery disease all of which have been stable.  He does not really describe a syncopal or presyncopal event.  Past Medical History:  Diagnosis Date  . ADHD (attention deficit hyperactivity disorder) 03/03/2018  . CAD (coronary artery disease) 03/03/2018   Cardiac cath  03/03/18 70% distal left main, 99% proximal LAD, 40-50% proximal circumflex, 60% intermediate, 90% ostial right coronary artery, 55% LVEF with some mild distal anterior hypokinesis  . Depression 06/30/2016  . Essential tremor   . History of traumatic head injury   . Hyperlipidemia 03/03/2018   Past Surgical History:  Procedure Laterality Date  . ANKLE SURGERY    . CORONARY ARTERY BYPASS GRAFT N/A 03/08/2018   Procedure: CORONARY ARTERY BYPASS GRAFTING (CABG) X4, RIGHT SAPHENOUS VEIN HARVEST. LIMA TO LAD, SVG TO OM, SVG TO RAMUS, SVG TO PD.;  Surgeon: [name], MD;  Location: [facility] OR;  Service: Open Heart Surgery;  Laterality: N/A;  .  ENDARTERECTOMY Left 03/08/2018   Procedure: ENDARTERECTOMY CAROTID;  Surgeon: [name], MD;  Location[facility] OR;  Service: Vascular;  Laterality: Left;  . KNEE SURGERY    . LEFT HEART CATH AND CORONARY ANGIOGRAPHY N/A 03/03/2018   Procedure: LEFT HEART CATH AND CORONARY ANGIOGRAPHY;  Surgeon: [name], MD;  Location: [facility] INVASIVE CV LAB;  Service: Cardiovascular;  Laterality: N/A;  . TEE WITHOUT CARDIOVERSION N/A 03/08/2018   Procedure: TRANSESOPHAGEAL ECHOCARDIOGRAM (TEE);  Surgeon: [name], [name], MD;  Location: [facility] OR;  Service: Open Heart Surgery;  Laterality: N/A;  . TONSILLECTOMY      Family History  Problem Relation Age of Onset  . Lung cancer Mother   . Diabetes Brother     SOCIAL HISTORY: Social History   Socioeconomic History  . Marital status: Married    Spouse name: Not on file  . Number of children: Not on file  . Years of education: Not on file  . Highest education level: Not on file  Occupational History  . Not on file  Tobacco Use  . Smoking status: Never Smoker  . Smokeless tobacco: Never Used  Substance and Sexual Activity  . Alcohol use: Not Currently  . Drug use: Never  . Sexual activity: Yes  Other Topics Concern  . Not on file  Social History Narrative  . Not on file   Social Determinants of Health  Financial Resource Strain:   . Difficulty of Paying Living Expenses:   Food Insecurity:   . Worried About Programme researcher, broadcasting/film/video in the Last Year:   . Barista in the Last Year:   Transportation Needs:   . Freight forwarder (Medical):   [name] Lack of Transportation (Non-Medical):   Physical Activity:   . Days of Exercise per Week:   . Minutes of Exercise per Session:   Stress:   . Feeling of Stress :   Social Connections:   . Frequency of Communication with Friends and Family:   . Frequency of Social Gatherings with Friends and Family:   . Attends Religious Services:   . Active Member of Clubs or Organizations:   . Attends Occupational hygienist Meetings:   [name] Marital Status:   Intimate Partner Violence:   . Fear of Current or Ex-Partner:   . Emotionally Abused:   [name] Physically Abused:   . Sexually Abused:     No Known Allergies  Current Outpatient Medications  Medication Sig Dispense Refill  . aspirin EC 81 MG EC tablet Take 1 tablet (81 mg total) by mouth daily.    [name] atorvastatin (LIPITOR) 20 MG tablet TAKE 1 TABLET(20 MG) BY MOUTH DAILY AT 6 PM 90 tablet 1  . buPROPion (WELLBUTRIN XL) 300 MG 24 hr tablet Take 300 mg by mouth daily.    [name] escitalopram (LEXAPRO) 5 MG tablet Take 5 mg by mouth daily.  0  . NON FORMULARY Apply 50 mg topically daily. Testosterone 50mg /ml  0  . propranolol ER (INDERAL LA) 80 MG 24 hr capsule Take 1 capsule (80 mg total) by mouth daily. 90 capsule 1   No current facility-administered medications for this visit.    ROS:   General:  No weight loss, Fever, chills  HEENT: No recent headaches, no nasal bleeding, no visual changes, no sore throat  Neurologic: No dizziness, blackouts, seizures. No recent symptoms of stroke or mini- stroke. No recent episodes of slurred speech, or temporary blindness.  Cardiac: No recent episodes of chest pain/pressure, no shortness of breath at rest.  No shortness of breath with exertion.  Denies history of atrial fibrillation or irregular heartbeat  Vascular: No history of rest pain in feet.  No history of claudication.  No history of non-healing ulcer, No history of DVT   Pulmonary: No home oxygen, no productive cough, no hemoptysis,  No asthma or wheezing  Musculoskeletal:  [ ]  Arthritis, [ ]  Low back pain,  [ ]  Joint pain  Hematologic:No history of hypercoagulable state.  No history of easy bleeding.  No history of anemia  Gastrointestinal: No hematochezia or melena,  No gastroesophageal reflux, no trouble swallowing  Urinary: [ ]  chronic Kidney disease, [ ]  on HD - [ ]  MWF or [ ]  TTHS, [ ]  Burning with urination, [ ]  Frequent urination, [ ]   Difficulty urinating;   Skin: No rashes  Psychological: No history of anxiety,  No history of depression   Physical Examination  Vitals:   01/16/20 1547 01/16/20 1550  BP: (!) 151/67 (!) 144/69  Pulse: (!) 56   Resp: 16   Temp: 98 F (36.7 C)   TempSrc: Temporal   SpO2: 99%   Weight: 158 lb (71.7 kg)   Height: 5\' 10"  (1.778 m)     Body mass index is 22.67 kg/m.  General:  Alert and oriented, no acute distress HEENT: Normal Neck: No JVD Pulmonary: Clear to auscultation  bilaterally Cardiac: Regular Rate and Rhythm Skin: No rash Extremity Pulses:  2+ radial, brachial pulses bilaterally Musculoskeletal: No deformity or edema  Neurologic: Upper and lower extremity motor 5/5 and symmetric, no facial asymmetry  DATA:  Patient had a carotid duplex exam today which showed no significant left-sided stenosis.  Right side was 60 to 80% narrowed consistent with his previous duplex exam over the last several years no change  ASSESSMENT: Episode of right arm weakness possible TIA no significant carotid stenosis.   PLAN: Since this event was now almost 3 weeks ago I believe the best option to fully evaluate for any neurologic event would be an MRI of the brain.  We will get this scheduled for him and do a virtual visit to discuss results.  Patient apparently has follow-up scheduled with his cardiologist later this week.  I encouraged him to keep that visit as well as some of his symptoms could be cardiac arrhythmia related.   Ruta Hinds, MD [facility] Office: [phone] Pager: [phone]

## 2020-01-17 ENCOUNTER — Other Ambulatory Visit: Payer: Medicare PPO

## 2020-01-17 ENCOUNTER — Encounter: Payer: Self-pay | Admitting: Neurology

## 2020-01-17 ENCOUNTER — Other Ambulatory Visit: Payer: Self-pay

## 2020-01-18 ENCOUNTER — Encounter: Payer: Self-pay | Admitting: Cardiology

## 2020-01-18 ENCOUNTER — Ambulatory Visit (INDEPENDENT_AMBULATORY_CARE_PROVIDER_SITE_OTHER): Payer: Medicare PPO

## 2020-01-18 ENCOUNTER — Ambulatory Visit (HOSPITAL_COMMUNITY)
Admission: RE | Admit: 2020-01-18 | Discharge: 2020-01-18 | Disposition: A | Discharge status: Home / Self Care | Payer: Medicare PPO | Source: Ambulatory Visit | Attending: Vascular Surgery | Admitting: Vascular Surgery

## 2020-01-18 ENCOUNTER — Ambulatory Visit: Payer: Medicare PPO | Admitting: Cardiology

## 2020-01-18 ENCOUNTER — Other Ambulatory Visit: Payer: Self-pay

## 2020-01-18 VITALS — BP 137/70 | HR 64 | Temp 97.2°F | Ht 70.0 in | Wt 159.0 lb

## 2020-01-18 DIAGNOSIS — R29898 Other symptoms and signs involving the musculoskeletal system: Secondary | ICD-10-CM | POA: Insufficient documentation

## 2020-01-18 DIAGNOSIS — I480 Paroxysmal atrial fibrillation: Secondary | ICD-10-CM

## 2020-01-18 DIAGNOSIS — G459 Transient cerebral ischemic attack, unspecified: Secondary | ICD-10-CM

## 2020-01-18 DIAGNOSIS — I6523 Occlusion and stenosis of bilateral carotid arteries: Secondary | ICD-10-CM | POA: Diagnosis present

## 2020-01-18 DIAGNOSIS — Z951 Presence of aortocoronary bypass graft: Secondary | ICD-10-CM

## 2020-01-18 DIAGNOSIS — E785 Hyperlipidemia, unspecified: Secondary | ICD-10-CM

## 2020-01-18 HISTORY — DX: Transient cerebral ischemic attack, unspecified: G45.9

## 2020-01-18 NOTE — Patient Instructions (Signed)
Medication Instructions:  Your physician recommends that you continue on your current medications as directed. Please refer to the Current Medication list given to you today.  *If you need a refill on your cardiac medications before your next appointment, please call your pharmacy*   Lab Work: No labs were ordered for you today.  If you have labs (blood work) drawn today and your tests are completely normal, you will receive your results only by: [name] MyChart Message (if you have MyChart) OR . A paper copy in the mail If you have any lab test that is abnormal or we need to change your treatment, we will call you to review the results.   Testing/Procedures: A zio monitor was ordered today. It will remain on for 14 days. You will then return monitor and event diary in provided box. It takes 1-2 weeks for report to be downloaded and returned to [place]. We will call you with the results. If monitor falls off or has orange flashing light, please call Zio for further instructions.    Follow-Up: At [facility], you and your health needs are our priority.  As part of our continuing mission to provide you with exceptional heart care, we have created designated Provider Care Teams.  These Care Teams include your primary Cardiologist (physician) and Advanced Practice Providers (APPs -  Physician Assistants and Nurse Practitioners) who all work together to provide you with the care you need, when you need it.  We recommend signing up for the patient portal called "MyChart".  Sign up information is provided on this After Visit Summary.  MyChart is used to connect with patients for Virtual Visits (Telemedicine).  Patients are able to view lab/test results, encounter notes, upcoming appointments, etc.  Non-urgent messages can be sent to your provider as well.   To learn more about what you can do with MyChart, go to [email or web address].    Your next appointment:    1 Month  The format for your next  appointment:   Follow Up  Provider:   Dr. Bing Matter   Other Instructions

## 2020-01-18 NOTE — Progress Notes (Signed)
Cardiology Office Note:    Date:  01/18/2020   ID:  Zachary Ponce, DOB [birth year], MRN [id number]  PCP:  [name], MD  Cardiologist:  [name], MD    Referring MD: [name], MD   No chief complaint on file. I have some problems 2 weeks ago  History of Present Illness:    Zachary Ponce is a 69 y.o. male with past medical history significant for coronary artery disease, status post coronary artery bypass grafting 2019, bilateral carotic arterial disease followed by vascular surgeon.  Dyslipidemia, depression, anxiety, paroxysmal atrial fibrillation noted after surgery.  He requested to be seen.  2 weeks ago he went to the barber.  When he was there he started feeling dizzy he was able to get home when he came home he was very dizzy to the point that he is decided to lay down there is no chest pain no palpitations no other symptoms when he was laying down he became sweaty and eventually started throwing up.  Finally he decided to call 911.  When EMS came he was throwing up and then he noted that he could not move his right arm.  That sensation of inability to move his right arm lasted for about 10 minutes.  And after that things get back to more or less normal.  Since that time he seems to be doing well.  He was thinking that he may have had a panic attack. Denies have any chest pain tightness squeezing pressure burning chest, no palpitations. He did see Dr. Darrick Penna just few days ago and carotic ultrasound has been ordered.  Past Medical History:  Diagnosis Date  . ADHD (attention deficit hyperactivity disorder) 03/03/2018  . 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 Bilateral carotid artery disease (HCC) 03/07/2018   Left CEA 03/08/18 at time of CABG   . CAD (coronary artery disease) 03/03/2018   Cardiac cath  03/03/18 70% distal left main, 99% proximal LAD, 40-50% proximal circumflex, 60% intermediate, 90% ostial right coronary artery, 55% LVEF with some mild distal anterior  hypokinesis  . Depression 06/30/2016  . Dyslipidemia 07/24/2015   The 10-year ASCVD risk score Denman George DC Montez Hageman., et al., 2013) is: 15.7%   Values used to calculate the score:     Age: 1 years     Sex: Male     Is Non-Hispanic African American: No     Diabetic: No     Tobacco smoker: No     Systolic Blood Pressure: 114 mmHg     Is BP treated: No     HDL Cholesterol: 32 mg/dL     Total Cholesterol: 209 mg/dL The 37-CHYI ASCVD risk score [name], et al., 2013) is  . Essential tremor   . Generalized anxiety disorder 01/22/2016  . History of traumatic head injury   . Hyperlipidemia 03/03/2018  . Hypogonadism in male 06/20/2018  . Hypotension after procedure 06/22/2018  . Memory loss 06/30/2016  . On amiodarone therapy 06/22/2018  . PAF (paroxysmal atrial fibrillation) (HCC) 06/22/2018  . Post concussion syndrome 06/30/2016  . Post-concussion headache 06/30/2016  . 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 ANKLE SURGERY    . CORONARY ARTERY BYPASS GRAFT N/A 03/08/2018   Procedure: CORONARY ARTERY BYPASS GRAFTING (CABG) X4, RIGHT SAPHENOUS VEIN HARVEST. LIMA TO LAD, SVG TO OM, SVG TO RAMUS, SVG TO PD.;  Surgeon: [name], MD;  Location: [facility] OR;  Service:  Open Heart Surgery;  Laterality: N/A;  . ENDARTERECTOMY Left 03/08/2018   Procedure: ENDARTERECTOMY CAROTID;  Surgeon: [name], MD;  Location: [facility] OR;  Service: Vascular;  Laterality: Left;  . KNEE SURGERY    . LEFT HEART CATH AND CORONARY ANGIOGRAPHY N/A 03/03/2018   Procedure: LEFT HEART CATH AND CORONARY ANGIOGRAPHY;  Surgeon: [name], MD;  Location: [facility] INVASIVE CV LAB;  Service: Cardiovascular;  Laterality: N/A;  . TEE WITHOUT CARDIOVERSION N/A 03/08/2018   Procedure: TRANSESOPHAGEAL ECHOCARDIOGRAM (TEE);  Surgeon: [name], [name], MD;  Location: [facility] OR;  Service: Open Heart Surgery;  Laterality: N/A;  . TONSILLECTOMY      Current Medications: Current Meds  Medication Sig  .  amphetamine-dextroamphetamine (ADDERALL XR) 30 MG 24 hr capsule 30 mg every morning.  [name] aspirin EC 81 MG EC tablet Take 1 tablet (81 mg total) by mouth daily.  [name] atorvastatin (LIPITOR) 20 MG tablet TAKE 1 TABLET(20 MG) BY MOUTH DAILY AT 6 PM  . buPROPion (WELLBUTRIN XL) 300 MG 24 hr tablet Take 300 mg by mouth daily.  [name] escitalopram (LEXAPRO) 5 MG tablet Take 5 mg by mouth daily.  . NON FORMULARY Apply 50 mg topically daily. Testosterone 50mg /ml  . propranolol ER (INDERAL LA) 80 MG 24 hr capsule Take 1 capsule (80 mg total) by mouth daily.  testosterone cypionate (DEPOTESTOTERONE CYPIONATE) 100 MG/ML injection      Allergies:   Patient has no known allergies.   Social History   Socioeconomic History  . Marital status: Married    Spouse name: Not on file  . Number of children: Not on file  . Years of education: Not on file  . Highest education level: Not on file  Occupational History  . Not on file  Tobacco Use  . Smoking status: Never Smoker  . Smokeless tobacco: Never Used  Substance and Sexual Activity  . Alcohol use: Not Currently  . Drug use: Never  . Sexual activity: Yes  Other Topics Concern  . Not on file  Social History Narrative  . Not on file   Social Determinants of Health   Financial Resource Strain:   . Difficulty of Paying Living Expenses:   Food Insecurity:   . Worried About [name] in the Last Year:   . Programme researcher, broadcasting/film/video in the Last Year:   Transportation Needs:   . Barista (Medical):   Freight forwarder Lack of Transportation (Non-Medical):   Physical Activity:   . Days of Exercise per Week:   . Minutes of Exercise per Session:   Stress:   . Feeling of Stress :   Social Connections:   . Frequency of Communication with Friends and Family:   . Frequency of Social Gatherings with Friends and Family:   . Attends Religious Services:   . Active Member of Clubs or Organizations:   . Attends [name] Meetings:   Banker Marital Status:       Family History: The patient's family history includes Diabetes in his brother; Lung cancer in his mother. ROS:   Please see the history of present illness.    All 14 point review of systems negative except as described per history of present illness  EKGs/Labs/Other Studies Reviewed:      Recent Labs: 08/21/2019: ALT 28; BUN 24; Creatinine, Ser 1.01; Potassium 4.8; Sodium 139  Recent Lipid Panel    Component Value Date/Time   CHOL 106 08/21/2019 1503   TRIG 102  08/21/2019 1503   HDL 38 (L) 08/21/2019 1503   CHOLHDL 2.8 08/21/2019 1503   LDLCALC 49 08/21/2019 1503    Physical Exam:    VS:  BP 137/70   Pulse 64   Temp (!) 97.2 F (36.2 C) (Oral)   Ht 5\' 10"  (1.778 m)   Wt 159 lb (72.1 kg)   SpO2 98%   BMI 22.81 kg/m     Wt Readings from Last 3 Encounters:  01/18/20 159 lb (72.1 kg)  01/16/20 158 lb (71.7 kg)  10/17/19 164 lb (74.4 kg)     GEN:  Well nourished, well developed in no acute distress HEENT: Normal NECK: No JVD; No carotid bruits LYMPHATICS: No lymphadenopathy CARDIAC: RRR, no murmurs, no rubs, no gallops RESPIRATORY:  Clear to auscultation without rales, wheezing or rhonchi  ABDOMEN: Soft, non-tender, non-distended MUSCULOSKELETAL:  No edema; No deformity  SKIN: Warm and dry LOWER EXTREMITIES: no swelling NEUROLOGIC:  Alert and oriented x 3 PSYCHIATRIC:  Normal affect   ASSESSMENT:    1. PAF (paroxysmal atrial fibrillation) ([place])   2. TIA (transient ischemic attack)   3. Bilateral carotid artery stenosis   4. S/P CABG x 4   5. Dyslipidemia    PLAN:    In order of problems listed above:  1. Paroxysmal atrial fibrillation one episode after bypass surgery he was anticoagulated for short.  Time then anticoagulation has been discontinued since there was no reactivation of the problem.  Now it looks like he may have had a TIA.  I will put a Zio patch on him/remote telemetry to see if he had any recurrences of atrial fibrillation if that is  the case then full anticoagulation need to be started.  I will also want him to see neurologist quickly with his assessment and his expertise regarding this event.  He did see already Dr. Oneida Alar and his carotic ultrasound is pending. 2. TIA plan as outlined above 3. Bilateral carotic artery stenosis: I just reviewed carotic ultrasound done by Dr. Oneida Alar yesterday.  There is about up to 80% stenosis on the right intracardiac artery.  He symptoms involve right arm therefore this is not the size responsible for his symptomatology.  He does have less than 50% stenosis on the left side. 4. Status post coronary bypass graft: Doing well from that point review.  Denies have any chest pain. 5. Dyslipidemia: He is on statin which I will continue.  His last LDL was 49 5 months ago.  We will continue present management.   Medication Adjustments/Labs and Tests Ordered: Current medicines are reviewed at length with the patient today.  Concerns regarding medicines are outlined above.  No orders of the defined types were placed in this encounter.  Medication changes: No orders of the defined types were placed in this encounter.   Signed, Park Liter, MD, [facility] 01/18/2020 2:20 PM    [place]

## 2020-01-20 ENCOUNTER — Other Ambulatory Visit: Payer: Self-pay | Admitting: Cardiology

## 2020-01-20 DIAGNOSIS — R002 Palpitations: Secondary | ICD-10-CM | POA: Diagnosis not present

## 2020-01-21 NOTE — Addendum Note (Signed)
Addended by: Theola Sequin on: 01/21/2020 10:36 AM   Modules accepted: Orders

## 2020-01-24 ENCOUNTER — Ambulatory Visit (INDEPENDENT_AMBULATORY_CARE_PROVIDER_SITE_OTHER): Payer: Medicare PPO | Admitting: Vascular Surgery

## 2020-01-24 ENCOUNTER — Encounter: Payer: Self-pay | Admitting: Vascular Surgery

## 2020-01-24 ENCOUNTER — Other Ambulatory Visit: Payer: Self-pay

## 2020-01-24 DIAGNOSIS — I639 Cerebral infarction, unspecified: Secondary | ICD-10-CM | POA: Diagnosis not present

## 2020-01-24 NOTE — Progress Notes (Signed)
Virtual Visit via Telephone Note  I connected with Zachary Ponce on 01/24/2020 using the [employer] by telephone and verified that I was speaking with the correct person using two identifiers. Patient was located at home and accompanied by no one. I am located at our office on [address] [facility].   The limitations of evaluation and management by telemedicine and the availability of in person appointments have been previously discussed with the patient and are documented in the patients chart. The patient expressed understanding and consented to proceed.  PCP: Shirline Frees, MD  History of Present Illness: Zachary Ponce is a 69 y.o. male who previously underwent left carotid endarterectomy 2019.  This was combined with coronary artery bypass grafting.  He has not had any events of TIA amaurosis or stroke since that time.  He was last seen in our office February 2021.  He had no symptoms at that point.  On April 25 he began to have an episode of diaphoresis.  This passed after a few minutes.  3 days later he began to have diaphoresis once again with some nausea and dizziness.  He called EMS at that time and states that his blood pressure was 836 systolic.  He apparently had a normal EKG.  He states that also at the same time his right arm was not moving correctly.  This lasted about 5 to 10 minutes.  He has not had any further events.  He has had normal return of function in his right arm.  He does not describe any amaurosis.  He is on aspirin and a statin.  Other medical problems include anxiety hyperlipidemia coronary artery disease all of which have been stable.  He does not really describe a syncopal or presyncopal event.   Today he returns for follow-up to discuss the results of his recent MRI exam.  He has had no further events since I saw him last on May 12.  He has been seen by a cardiologist since then and is currently wearing an event monitor.    Past Medical History:  Diagnosis Date    . ADHD (attention deficit hyperactivity disorder) 03/03/2018  . 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 Bilateral carotid artery disease ([place]) 03/07/2018   Left CEA 03/08/18 at time of CABG   . CAD (coronary artery disease) 03/03/2018   Cardiac cath  03/03/18 70% distal left main, 99% proximal LAD, 40-50% proximal circumflex, 60% intermediate, 90% ostial right coronary artery, 55% LVEF with some mild distal anterior hypokinesis  . Depression 06/30/2016  . Dyslipidemia 07/24/2015   The 10-year ASCVD risk score [name] DC [name], et al., 2013) is: 15.7%   Values used to calculate the score:     Age: 75 years     Sex: Male     Is Non-Hispanic African American: No     Diabetic: No     Tobacco smoker: No     Systolic Blood Pressure: 629 mmHg     Is BP treated: No     HDL Cholesterol: 32 mg/dL     Total Cholesterol: 209 mg/dL The 10-year ASCVD risk score [name] DC Jr., et al., 2013) is  . Essential tremor   . Generalized anxiety disorder 01/22/2016  . History of traumatic head injury   . Hyperlipidemia 03/03/2018  . Hypogonadism in male 06/20/2018  . Hypotension after procedure 06/22/2018  . Memory loss 06/30/2016  . On amiodarone therapy 06/22/2018  . PAF (paroxysmal atrial fibrillation) ([place]) 06/22/2018  .  Post concussion syndrome 06/30/2016  . Post-concussion headache 06/30/2016  . 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 ANKLE SURGERY    . CORONARY ARTERY BYPASS GRAFT N/A 03/08/2018   Procedure: CORONARY ARTERY BYPASS GRAFTING (CABG) X4, RIGHT SAPHENOUS VEIN HARVEST. LIMA TO LAD, SVG TO OM, SVG TO RAMUS, SVG TO PD.;  Surgeon: [name], MD;  Location: [facility] OR;  Service: Open Heart Surgery;  Laterality: N/A;  . ENDARTERECTOMY Left 03/08/2018   Procedure: ENDARTERECTOMY CAROTID;  Surgeon: [name], MD;  Location: [facility] OR;  Service: Vascular;  Laterality: Left;  . KNEE SURGERY    . LEFT HEART CATH AND CORONARY ANGIOGRAPHY N/A 03/03/2018   Procedure: LEFT HEART  CATH AND CORONARY ANGIOGRAPHY;  Surgeon: [name], MD;  Location: [facility] INVASIVE CV LAB;  Service: Cardiovascular;  Laterality: N/A;  . TEE WITHOUT CARDIOVERSION N/A 03/08/2018   Procedure: TRANSESOPHAGEAL ECHOCARDIOGRAM (TEE);  Surgeon: [name], [name], MD;  Location: [facility] OR;  Service: Open Heart Surgery;  Laterality: N/A;  . TONSILLECTOMY      Current Meds  Medication Sig  . amphetamine-dextroamphetamine (ADDERALL XR) 30 MG 24 hr capsule 30 mg every morning.  [name] aspirin EC 81 MG EC tablet Take 1 tablet (81 mg total) by mouth daily.  [name] atorvastatin (LIPITOR) 20 MG tablet TAKE 1 TABLET(20 MG) BY MOUTH DAILY AT 6 PM  . buPROPion (WELLBUTRIN XL) 300 MG 24 hr tablet Take 300 mg by mouth daily.  [name] escitalopram (LEXAPRO) 5 MG tablet Take 5 mg by mouth daily.  . NON FORMULARY Apply 50 mg topically daily. Testosterone 50mg /ml  . propranolol ER (INDERAL LA) 80 MG 24 hr capsule Take 1 capsule (80 mg total) by mouth daily.  testosterone cypionate (DEPOTESTOTERONE CYPIONATE) 100 MG/ML injection    Review of systems: He has no shortness of breath.  He has no chest pain.  He does complain of a tremor in his hand and is scheduled to see neurology in August of this year regarding this.   Observations/Objective: I reviewed the patient's MRI exam which shows a chronic left frontal infarct and chronic small vessel disease  Assessment and Plan: Left brain stroke with no significant carotid stenosis on the left side.  Etiology could be secondary to embolic event from cardiac arrhythmia.  This is currently being evaluated with a cardiac monitor.  She is already on aspirin and a statin.  Follow Up Instructions:  Will be scheduled for carotid duplex exam in August 2021.  He does have known moderate right internal carotid artery stenosis of 60 to 80% with no significant left internal carotid artery stenosis.  He also has follow-up scheduled in the near future with cardiology and neurology.     I discussed  the assessment and treatment plan with the patient. The patient was provided an opportunity to ask questions and all were answered. The patient agreed with the plan and demonstrated an understanding of the instructions.   The patient was advised to call back or seek an in-person evaluation if the symptoms worsen or if the condition fails to improve as anticipated.  I spent 10 minutes with the patient via telephone encounter.   Signed, September 2021 [facility] Office: [phone]  01/24/2020, 12:01 PM

## 2020-01-28 ENCOUNTER — Other Ambulatory Visit: Payer: Self-pay | Admitting: *Deleted

## 2020-01-28 DIAGNOSIS — I6523 Occlusion and stenosis of bilateral carotid arteries: Secondary | ICD-10-CM

## 2020-02-07 ENCOUNTER — Telehealth: Payer: Self-pay | Admitting: Cardiology

## 2020-02-07 NOTE — Telephone Encounter (Signed)
Patient calling stating he would like to have his appointment 6/16 as virtual if possible, because [place]. He would like to know if he can.

## 2020-02-07 NOTE — Telephone Encounter (Signed)
Spoke with Zachary Ponce to let him know Dr. Vanetta Shawl nurse will touch base with him tomorrow or next week. He is out of town for the next 3 weeks and would like virtual visit. I am not sure how many virtual visits Dr. Bing Matter does per clinic day. He understands he may have to reschedule when he returns to the area.

## 2020-02-08 NOTE — Telephone Encounter (Signed)
Called patient and left detailed vm that he can have a virtual visit with Dr. Kirtland Bouchard. We will just keep the same appointment already scheduled on 02/20/20 and make that virtual. I asked him to call back if he had any further questions.

## 2020-02-20 ENCOUNTER — Telehealth (INDEPENDENT_AMBULATORY_CARE_PROVIDER_SITE_OTHER): Payer: Medicare PPO | Admitting: Cardiology

## 2020-02-20 ENCOUNTER — Other Ambulatory Visit: Payer: Self-pay

## 2020-02-20 ENCOUNTER — Encounter: Payer: Self-pay | Admitting: Cardiology

## 2020-02-20 VITALS — BP 135/62 | HR 69 | Wt 159.0 lb

## 2020-02-20 DIAGNOSIS — Z951 Presence of aortocoronary bypass graft: Secondary | ICD-10-CM

## 2020-02-20 DIAGNOSIS — I25119 Atherosclerotic heart disease of native coronary artery with unspecified angina pectoris: Secondary | ICD-10-CM

## 2020-02-20 DIAGNOSIS — I6523 Occlusion and stenosis of bilateral carotid arteries: Secondary | ICD-10-CM

## 2020-02-20 DIAGNOSIS — E785 Hyperlipidemia, unspecified: Secondary | ICD-10-CM

## 2020-02-20 DIAGNOSIS — I480 Paroxysmal atrial fibrillation: Secondary | ICD-10-CM

## 2020-02-20 NOTE — Progress Notes (Signed)
Virtual Visit via Video Note   This visit type was conducted due to national recommendations for restrictions regarding the COVID-19 Pandemic (e.g. social distancing) in an effort to limit this patient's exposure and mitigate transmission in our community.  Due to his co-morbid illnesses, this patient is at least at moderate risk for complications without adequate follow up.  This format is felt to be most appropriate for this patient at this time.  All issues noted in this document were discussed and addressed.  A limited physical exam was performed with this format.  Please refer to the patient's chart for his consent to telehealth for [facility].  Evaluation Performed:  Follow-up visit  This visit type was conducted due to national recommendations for restrictions regarding the COVID-19 Pandemic (e.g. social distancing).  This format is felt to be most appropriate for this patient at this time.  All issues noted in this document were discussed and addressed.  No physical exam was performed (except for noted visual exam findings with Video Visits).  Please refer to the patient's chart (MyChart message for video visits and phone note for telephone visits) for the patient's consent to telehealth for [facility].  Date:  02/20/2020  ID: Zachary Ponce, DOB 11-22-1950, MRN [id number]   Patient Location: [address] [place] [address]   Provider location:   [facility] Office  PCP:  Zachary Blamer, MD  Cardiologist:  Zachary Balsam, MD     Chief Complaint: I am doing fine  History of Present Illness:    Zachary Ponce is a 69 y.o. male  who presents via audio/video conferencing for a telehealth visit today.  male with past medical history significant for coronary artery disease, status post coronary artery bypass grafting 2019, bilateral carotic arterial disease followed by vascular surgeon.  Dyslipidemia, depression, anxiety, paroxysmal atrial fibrillation noted  after surgery.  He requested to be seen.  2 weeks ago he went to the barber.  When he was there he started feeling dizzy he was able to get home when he came home he was very dizzy to the point that he is decided to lay down there is no chest pain no palpitations no other symptoms when he was laying down he became sweaty and eventually started throwing up.  Finally he decided to call 911.  When EMS came he was throwing up and then he noted that he could not move his right arm.  That sensation of inability to move his right arm lasted for about 10 minutes.  And after that things get back to more or less normal.  Since that time he seems to be doing well.  He was thinking that he may have had a panic attack. He does have a video visit with me today.  Is been doing well he denies having a chest pain tightness squeezing pressure burning chest no palpitations.  He is at his beach house that he recently purchased.  He is planning to come back to [facility] area tomorrow.  She did see vascular surgeon who scheduled him to have carotic ultrasound in August.  He also wore monitor which showed no evidence of atrial fibrillation.  We had a long discussion about what to do with the situation today.   The patient does not have symptoms concerning for COVID-19 infection (fever, chills, cough, or new SHORTNESS OF BREATH).    Prior CV studies:   The following studies were reviewed today:  His monitor did not  show any evidence of atrial fibrillation.  He wore monitor for 2 weeks.  I got it back today.  No official report yet.  His MRI done in Jan 18, 2020 showed: IMPRESSION: 1. No acute intracranial abnormality. 2. Mild chronic small vessel ischemic disease. 3. Small chronic left frontal infarct.   Past Medical History:  Diagnosis Date  . ADHD (attention deficit hyperactivity disorder) 03/03/2018  . 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 Bilateral carotid artery disease ([place]) 03/07/2018   Left CEA 03/08/18 at time  of CABG   . CAD (coronary artery disease) 03/03/2018   Cardiac cath  03/03/18 70% distal left main, 99% proximal LAD, 40-50% proximal circumflex, 60% intermediate, 90% ostial right coronary artery, 55% LVEF with some mild distal anterior hypokinesis  . Depression 06/30/2016  . Dyslipidemia 07/24/2015   The 10-year ASCVD risk score [name] DC [name], et al., 2013) is: 15.7%   Values used to calculate the score:     Age: 55 years     Sex: Male     Is Non-Hispanic African American: No     Diabetic: No     Tobacco smoker: No     Systolic Blood Pressure: 678 mmHg     Is BP treated: No     HDL Cholesterol: 32 mg/dL     Total Cholesterol: 209 mg/dL The 10-year ASCVD risk score [name] DC Jr., et al., 2013) is  . Essential tremor   . Generalized anxiety disorder 01/22/2016  . History of traumatic head injury   . Hyperlipidemia 03/03/2018  . Hypogonadism in male 06/20/2018  . Hypotension after procedure 06/22/2018  . Memory loss 06/30/2016  . On amiodarone therapy 06/22/2018  . PAF (paroxysmal atrial fibrillation) ([place]) 06/22/2018  . Post concussion syndrome 06/30/2016  . Post-concussion headache 06/30/2016  . 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 TIA (transient ischemic attack) 01/18/2020    Past Surgical History:  Procedure Laterality Date  . ANKLE SURGERY    . CORONARY ARTERY BYPASS GRAFT N/A 03/08/2018   Procedure: CORONARY ARTERY BYPASS GRAFTING (CABG) X4, RIGHT SAPHENOUS VEIN HARVEST. LIMA TO LAD, SVG TO OM, SVG TO RAMUS, SVG TO PD.;  Surgeon: Ivin Poot, MD;  Location: [place];  Service: Open Heart Surgery;  Laterality: N/A;  . ENDARTERECTOMY Left 03/08/2018   Procedure: ENDARTERECTOMY CAROTID;  Surgeon: Elam Dutch, MD;  Location: [place];  Service: Vascular;  Laterality: Left;  . KNEE SURGERY    . LEFT HEART CATH AND CORONARY ANGIOGRAPHY N/A 03/03/2018   Procedure: LEFT HEART CATH AND CORONARY ANGIOGRAPHY;  Surgeon: Troy Sine, MD;  Location: [facility];  Service: Cardiovascular;  Laterality:  N/A;  . TEE WITHOUT CARDIOVERSION N/A 03/08/2018   Procedure: TRANSESOPHAGEAL ECHOCARDIOGRAM (TEE);  Surgeon: Prescott Gum, Collier Salina, MD;  Location: [place];  Service: Open Heart Surgery;  Laterality: N/A;  . TONSILLECTOMY       Current Meds  Medication Sig  . aspirin EC 81 MG EC tablet Take 1 tablet (81 mg total) by mouth daily.  [name] atorvastatin (LIPITOR) 20 MG tablet TAKE 1 TABLET(20 MG) BY MOUTH DAILY AT 6 PM  . buPROPion (WELLBUTRIN XL) 300 MG 24 hr tablet Take 300 mg by mouth daily.  [name] escitalopram (LEXAPRO) 5 MG tablet Take 5 mg by mouth daily.  . NON FORMULARY Apply 50 mg topically daily. Testosterone 50mg /ml  . propranolol ER (INDERAL LA) 80 MG 24 hr capsule Take 1 capsule (80 mg total) by mouth daily.  [name] testosterone  cypionate (DEPOTESTOTERONE CYPIONATE) 100 MG/ML injection       Family History: The patient's family history includes Diabetes in his brother; Lung cancer in his mother.   ROS:   Please see the history of present illness.     All other systems reviewed and are negative.   Labs/Other Tests and Data Reviewed:     Recent Labs: 08/21/2019: ALT 28; BUN 24; Creatinine, Ser 1.01; Potassium 4.8; Sodium 139  Recent Lipid Panel    Component Value Date/Time   CHOL 106 08/21/2019 1503   TRIG 102 08/21/2019 1503   HDL 38 (L) 08/21/2019 1503   CHOLHDL 2.8 08/21/2019 1503   LDLCALC 49 08/21/2019 1503      Exam:    Vital Signs:  BP 135/62   Pulse 69   Wt 159 lb (72.1 kg)   BMI 22.81 kg/m     Wt Readings from Last 3 Encounters:  02/20/20 159 lb (72.1 kg)  01/18/20 159 lb (72.1 kg)  01/16/20 158 lb (71.7 kg)     Well nourished, well developed in no acute distress. Is alert awake oriented x3 talking to you about video link.  Busy working on his house.  Not in any distress.  Diagnosis for this visit:   1. Bilateral carotid artery stenosis   2. Coronary artery disease involving native coronary artery of native heart with angina pectoris (HCC)   3. Dyslipidemia    4. Hyperlipidemia, unspecified hyperlipidemia type   5. S/P CABG x 4      ASSESSMENT & PLAN:    1.  History of CVA.  Investigation still ongoing.  Obviously I am trying to rule out cardiac source of emboli.  He did wear monitor for 2 weeks showed no evidence of atrial fibrillation.  We discussed today potential issue of implantable loop recorder.  He agreed to explore it.  Therefore I will ask him to see electrophysiology team to continue discussion about that.  I explained to him also if we find out that he does have atrial fibrillation plan we need to put him on anticoagulation therapy which he will be willing to take on as needed.  He does have some device that allows him to record his EKG and blood pressure he does not daily and he does not see any evidence of atrial fibrillation.  However the fact that he has episodes of atrial fibrillation after bypass surgery making obviously more likely to have atrial fibrillation now.  Again implantable loop recorder will be beneficial. 2.  Coronary artery disease: Stable on appropriate medications which I will continue.  3.  Dyslipidemia: He is taking Lipitor 20 mg.  We will make arrangement to have his cholesterol rechecked within the next few weeks.  COVID-19 Education: The signs and symptoms of COVID-19 were discussed with the patient and how to seek care for testing (follow up with PCP or arrange E-visit).  The importance of social distancing was discussed today.  Patient Risk:   After full review of this patients clinical status, I feel that they are at least moderate risk at this time.  Time:   Today, I have spent 10 minutes with the patient with telehealth technology discussing pt health issues.  I spent 25 minutes reviewing her chart before the visit.  Visit was finished at 2:31 PM.    Medication Adjustments/Labs and Tests Ordered: Current medicines are reviewed at length with the patient today.  Concerns regarding medicines are outlined  above.  No orders of the defined  types were placed in this encounter.  Medication changes: No orders of the defined types were placed in this encounter.    Disposition: Follow-up 2 months  Signed, Georgeanna Lea, MD, [facility] 02/20/2020 2:28 PM    [facility]

## 2020-02-20 NOTE — Addendum Note (Signed)
Addended by: Lita Mains on: 02/20/2020 02:44 PM   Modules accepted: Orders

## 2020-02-20 NOTE — Patient Instructions (Addendum)
Medication Instructions:  Your physician recommends that you continue on your current medications as directed. Please refer to the Current Medication list given to you today.  *If you need a refill on your cardiac medications before your next appointment, please call your pharmacy*   Lab Work: None.  If you have labs (blood work) drawn today and your tests are completely normal, you will receive your results only by: . MyChart Message (if you have MyChart) OR . A paper copy in the mail If you have any lab test that is abnormal or we need to change your treatment, we will call you to review the results.   Testing/Procedures: None.   Follow-Up: At [facility], you and your health needs are our priority.  As part of our continuing mission to provide you with exceptional heart care, we have created designated Provider Care Teams.  These Care Teams include your primary Cardiologist (physician) and Advanced Practice Providers (APPs -  Physician Assistants and Nurse Practitioners) who all work together to provide you with the care you need, when you need it.  We recommend signing up for the patient portal called "MyChart".  Sign up information is provided on this After Visit Summary.  MyChart is used to connect with patients for Virtual Visits (Telemedicine).  Patients are able to view lab/test results, encounter notes, upcoming appointments, etc.  Non-urgent messages can be sent to your provider as well.   To learn more about what you can do with MyChart, go to [email or web address].com.    Your next appointment:   2 month(s)  The format for your next appointment:   In Person  Provider:   Robert Krasowski, MD   Other Instructions  Dr. Krasowski has referred you to see Dr. Camnitz   

## 2020-02-21 NOTE — Progress Notes (Addendum)
NEUROLOGY CONSULTATION NOTE  Zachary Ponce MRN: [id number] DOB: 1950/11/20  Referring provider: Shirline Frees, MD Primary care provider: Shirline Frees, MD  Reason for consult:  TIA  HISTORY OF PRESENT ILLNESS: Zachary Ponce is a 69 year old ambidextrous Caucasian male with CAD s/p coronary artery bypass grafting, left carotid artery disease s/p CEA, HLD, depression and anxiety, essential tremor and history of traumatic brain injury who presents for TIA.  History supplemented by vascular, cardiology and referring provider's notes.  In late April, he had an episode of dizziness, nausea, vomiting.  He was laying on the bed with his right arm laying over the side and couldn't move it for about 5 minutes.  EMS was called and blood pressure was reportedly 694 systolic and EKG reportedly normal.    Carotid ultrasound on 01/16/2020 showed 60-79% right ICA stenosis and 1-39% left ICA stenosis.  MRI of brain without contrast on 01/18/2020 personally reviewed showed mild chronic small vessel ischemic changes and small remote left frontal infact but no acute intracranial abnormality.  2 week cardiac event monitor earlier this month showed no a fib.  He and cardiology are considering implantable loop recorder.    He was on ASA 81mg  and Lipitor 20mg  daily prior to event.  No change was made in management.  In 2019, he underwent left CEA for carotid artery stenosis and coronary bypass grafting.  Following procedure, he did have episodes of atrial fibrillation and was on Eliquis for a period of time.    08/21/2019 LDL 49.  PAST MEDICAL HISTORY: Past Medical History:  Diagnosis Date  . ADHD (attention deficit hyperactivity disorder) 03/03/2018  . 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 Bilateral carotid artery disease ([place]) 03/07/2018   Left CEA 03/08/18 at time of CABG   . CAD (coronary artery disease) 03/03/2018   Cardiac cath  03/03/18 70% distal left main, 99% proximal LAD, 40-50% proximal  circumflex, 60% intermediate, 90% ostial right coronary artery, 55% LVEF with some mild distal anterior hypokinesis  . Depression 06/30/2016  . Dyslipidemia 07/24/2015   The 10-year ASCVD risk score [name] DC [name], et al., 2013) is: 15.7%   Values used to calculate the score:     Age: 62 years     Sex: Male     Is Non-Hispanic African American: No     Diabetic: No     Tobacco smoker: No     Systolic Blood Pressure: 854 mmHg     Is BP treated: No     HDL Cholesterol: 32 mg/dL     Total Cholesterol: 209 mg/dL The 10-year ASCVD risk score [name] DC Jr., et al., 2013) is  . Essential tremor   . Generalized anxiety disorder 01/22/2016  . History of traumatic head injury   . Hyperlipidemia 03/03/2018  . Hypogonadism in male 06/20/2018  . Hypotension after procedure 06/22/2018  . Memory loss 06/30/2016  . On amiodarone therapy 06/22/2018  . PAF (paroxysmal atrial fibrillation) ([place]) 06/22/2018  . Post concussion syndrome 06/30/2016  . Post-concussion headache 06/30/2016  . 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 TIA (transient ischemic attack) 01/18/2020    PAST SURGICAL HISTORY: Past Surgical History:  Procedure Laterality Date  . ANKLE SURGERY    . CORONARY ARTERY BYPASS GRAFT N/A 03/08/2018   Procedure: CORONARY ARTERY BYPASS GRAFTING (CABG) X4, RIGHT SAPHENOUS VEIN HARVEST. LIMA TO LAD, SVG TO OM, SVG TO RAMUS, SVG TO PD.;  Surgeon: Ivin Poot, MD;  Location: [facility];  Service: Open  Heart Surgery;  Laterality: N/A;  . ENDARTERECTOMY Left 03/08/2018   Procedure: ENDARTERECTOMY CAROTID;  Surgeon: [name], MD;  Location: [facility] OR;  Service: Vascular;  Laterality: Left;  . KNEE SURGERY    . LEFT HEART CATH AND CORONARY ANGIOGRAPHY N/A 03/03/2018   Procedure: LEFT HEART CATH AND CORONARY ANGIOGRAPHY;  Surgeon: [name], MD;  Location: [facility] INVASIVE CV LAB;  Service: Cardiovascular;  Laterality: N/A;  . TEE WITHOUT CARDIOVERSION N/A 03/08/2018   Procedure: TRANSESOPHAGEAL ECHOCARDIOGRAM (TEE);  Surgeon:  [name], [name], MD;  Location: [facility] OR;  Service: Open Heart Surgery;  Laterality: N/A;  . TONSILLECTOMY      MEDICATIONS: Current Outpatient Medications on File Prior to Visit  Medication Sig Dispense Refill  . aspirin EC 81 MG EC tablet Take 1 tablet (81 mg total) by mouth daily.    [name] atorvastatin (LIPITOR) 20 MG tablet TAKE 1 TABLET(20 MG) BY MOUTH DAILY AT 6 PM 90 tablet 1  . buPROPion (WELLBUTRIN XL) 300 MG 24 hr tablet Take 300 mg by mouth daily.    [name] escitalopram (LEXAPRO) 5 MG tablet Take 5 mg by mouth daily.  0  . NON FORMULARY Apply 50 mg topically daily. Testosterone 50mg /ml  0  . propranolol ER (INDERAL LA) 80 MG 24 hr capsule Take 1 capsule (80 mg total) by mouth daily. 90 capsule 1  . testosterone cypionate (DEPOTESTOTERONE CYPIONATE) 100 MG/ML injection      No current facility-administered medications on file prior to visit.    ALLERGIES: No Known Allergies  FAMILY HISTORY: Family History  Problem Relation Age of Onset  . Lung cancer Mother   . Diabetes Brother    SOCIAL HISTORY: Social History   Socioeconomic History  . Marital status: Married    Spouse name: Not on file  . Number of children: Not on file  . Years of education: Not on file  . Highest education level: Not on file  Occupational History  . Not on file  Tobacco Use  . Smoking status: Never Smoker  . Smokeless tobacco: Never Used  Vaping Use  . Vaping Use: Never used  Substance and Sexual Activity  . Alcohol use: Not Currently  . Drug use: Never  . Sexual activity: Yes  Other Topics Concern  . Not on file  Social History Narrative  . Not on file   Social Determinants of Health   Financial Resource Strain:   . Difficulty of Paying Living Expenses:   Food Insecurity:   . Worried About in the Last Year:   . Programme researcher, broadcasting/film/video in the Last Year:   Transportation Needs:   . Barista (Medical):   Freight forwarder Lack of Transportation (Non-Medical):   Physical  Activity:   . Days of Exercise per Week:   . Minutes of Exercise per Session:   Stress:   . Feeling of Stress :   Social Connections:   . Frequency of Communication with Friends and Family:   . Frequency of Social Gatherings with Friends and Family:   . Attends Religious Services:   . Active Member of Clubs or Organizations:   . Attends [name] Meetings:   Banker Marital Status:   Intimate Partner Violence:   . Fear of Current or Ex-Partner:   . Emotionally Abused:   [name] Physically Abused:   . Sexually Abused:       PHYSICAL EXAM: Blood pressure 121/64, pulse 65, resp. rate 20, height  5\' 10"  (1.778 m), weight 161 lb (73 kg), SpO2 99 %. General: No acute distress.  Patient appears well-groomed.   Head:  Normocephalic/atraumatic Eyes:  fundi examined but not visualized Neck: supple, no paraspinal tenderness, full range of motion Back: No paraspinal tenderness Heart: regular rate and rhythm Lungs: Clear to auscultation bilaterally. Vascular: No carotid bruits. Neurological Exam: Mental status: alert and oriented to person, place, and time, recent and remote memory intact, fund of knowledge intact, attention and concentration intact, speech fluent and not dysarthric, language intact. Cranial nerves: CN I: not tested CN II: pupils equal, round and reactive to light, visual fields intact CN III, IV, VI:  full range of motion, no nystagmus, no ptosis CN V: facial sensation intact CN VII: upper and lower face symmetric CN VIII: hearing intact CN IX, X: gag intact, uvula midline CN XI: sternocleidomastoid and trapezius muscles intact CN XII: tongue midline Bulk & Tone: normal, no fasciculations. Motor:  5/5 throughout.  Postural and kinetic tremor of both hands. Sensation:  Pinprick and vibration sensation intact.   Deep Tendon Reflexes:  2+ throughout, toes downgoing.   Finger to nose testing:  Without dysmetria.   Heel to shin:  Without dysmetria.   Gait:  Normal  station and stride.  Romberg negative.  IMPRESSION: 1.  Transient ischemic attack or left MCA infarct (unclear if remote left frontal infarct may have been the cause of his symptom) 2.  Bilateral carotid artery disease s/p left CEA 3.  CAD 4.  History of transient paroxysmal atrial fibrillation following coronary bypass graft  PLAN: 1. Will reach out to patient's cardiologist, Dr. , about changing ASA to Plavix. ADDENDUM:  Dr. Bing Matter has no objection changing from ASA to Plavix.  Will have patient discontinue ASA and start Plavix 75mg  daily. 2.  Continue atorvastatin 20mg  daily (LDL at goal less than 70) 3.  Check CTA of head and neck 4.  Check echocardiogram 5.  Would still consider implantable loop recorder. 6.  Continue blood pressure control 6.  Follow up in 4 months.   Thank you for allowing me to take part in the care of this patient.  Bing Matter, DO  CC: , MD  , MD

## 2020-02-22 ENCOUNTER — Telehealth: Payer: Self-pay

## 2020-02-22 ENCOUNTER — Ambulatory Visit: Payer: Medicare PPO | Admitting: Neurology

## 2020-02-22 ENCOUNTER — Other Ambulatory Visit: Payer: Self-pay

## 2020-02-22 ENCOUNTER — Encounter: Payer: Self-pay | Admitting: Neurology

## 2020-02-22 VITALS — BP 121/64 | HR 65 | Resp 20 | Ht 70.0 in | Wt 161.0 lb

## 2020-02-22 DIAGNOSIS — I25119 Atherosclerotic heart disease of native coronary artery with unspecified angina pectoris: Secondary | ICD-10-CM

## 2020-02-22 DIAGNOSIS — G459 Transient cerebral ischemic attack, unspecified: Secondary | ICD-10-CM | POA: Diagnosis not present

## 2020-02-22 DIAGNOSIS — I6523 Occlusion and stenosis of bilateral carotid arteries: Secondary | ICD-10-CM | POA: Diagnosis not present

## 2020-02-22 DIAGNOSIS — Z8679 Personal history of other diseases of the circulatory system: Secondary | ICD-10-CM

## 2020-02-22 MED ORDER — PROPRANOLOL HCL ER 80 MG PO CP24: 80.0000 mg | ORAL_CAPSULE | Freq: Every day | ORAL | 3 refills | Status: DC

## 2020-02-22 NOTE — Patient Instructions (Addendum)
1.  I will check with your cardiologist about changing aspirin to Plavix 2.  We will check CTA of head and neck 3.  We will check echocardiogram 4.  Continue current atorvastatin 5.  Follow up in 4 months.  We have sent a referral to [facility] for your CTAI and they will call you directly to schedule your appointment. They are located at [address] [facility]. If you need to contact them directly please call [phone].   [facility] will contact you regarding the echo.

## 2020-02-22 NOTE — Telephone Encounter (Signed)
Refill sent for Propranolol to [facility]

## 2020-02-25 ENCOUNTER — Ambulatory Visit: Payer: Medicare PPO | Admitting: Cardiology

## 2020-02-27 ENCOUNTER — Telehealth: Payer: Self-pay

## 2020-02-27 NOTE — Telephone Encounter (Signed)
-----   Message from Drema Dallas, DO sent at 02/27/2020 12:16 PM EDT ----- Please contact patient and let him know that I would like him to stop aspirin.  I would like to start him on Plavix 75mg  daily for secondary stroke prevention.  His cardiologist has no objection to this change.

## 2020-02-27 NOTE — Telephone Encounter (Signed)
Pt advised  Dr. Everlena Cooper want pt to stop Asprin and start Plavix 75mg . Will send in scrip to to pt pharmacy

## 2020-02-28 ENCOUNTER — Other Ambulatory Visit: Payer: Self-pay

## 2020-02-28 ENCOUNTER — Other Ambulatory Visit (HOSPITAL_COMMUNITY): Payer: Medicare PPO

## 2020-02-28 MED ORDER — CLOPIDOGREL BISULFATE 75 MG PO TABS
75.0000 mg | ORAL_TABLET | Freq: Every day | ORAL | 5 refills | Status: DC
Start: 2020-02-28 — End: 2020-08-20

## 2020-03-10 DIAGNOSIS — F341 Dysthymic disorder: Secondary | ICD-10-CM | POA: Diagnosis not present

## 2020-03-13 DIAGNOSIS — F341 Dysthymic disorder: Secondary | ICD-10-CM | POA: Diagnosis not present

## 2020-03-17 ENCOUNTER — Ambulatory Visit
Admission: RE | Admit: 2020-03-17 | Discharge: 2020-03-17 | Disposition: A | Discharge status: Home / Self Care | Payer: Medicare PPO | Source: Ambulatory Visit | Attending: Neurology | Admitting: Neurology

## 2020-03-17 DIAGNOSIS — I6621 Occlusion and stenosis of right posterior cerebral artery: Secondary | ICD-10-CM | POA: Diagnosis not present

## 2020-03-17 DIAGNOSIS — G459 Transient cerebral ischemic attack, unspecified: Secondary | ICD-10-CM

## 2020-03-17 DIAGNOSIS — I6603 Occlusion and stenosis of bilateral middle cerebral arteries: Secondary | ICD-10-CM | POA: Diagnosis not present

## 2020-03-17 DIAGNOSIS — I63233 Cerebral infarction due to unspecified occlusion or stenosis of bilateral carotid arteries: Secondary | ICD-10-CM | POA: Diagnosis not present

## 2020-03-17 DIAGNOSIS — F341 Dysthymic disorder: Secondary | ICD-10-CM | POA: Diagnosis not present

## 2020-03-17 MED ORDER — IOPAMIDOL (ISOVUE-370) INJECTION 76%
75.0000 mL | Freq: Once | INTRAVENOUS | Status: AC | PRN
  Administered 2020-03-17: 75 mL via INTRAVENOUS

## 2020-03-19 ENCOUNTER — Telehealth: Payer: Self-pay

## 2020-03-19 NOTE — Telephone Encounter (Signed)
Spoke with pt and notified of CTA results per Dr Everlena Cooper -   CTA reviewed. There is some plaque build up causing some narrowing of the arteries in the head but no blockage.

## 2020-03-19 NOTE — Telephone Encounter (Signed)
-----   Message from Drema Dallas, DO sent at 03/19/2020 11:56 AM EDT ----- CTA reviewed.  There is some plaque build up causing some narrowing of the arteries in the head but no blockage.

## 2020-03-20 ENCOUNTER — Ambulatory Visit (HOSPITAL_COMMUNITY): Payer: Medicare PPO | Attending: Internal Medicine

## 2020-03-20 ENCOUNTER — Other Ambulatory Visit: Payer: Self-pay

## 2020-03-20 DIAGNOSIS — F341 Dysthymic disorder: Secondary | ICD-10-CM | POA: Diagnosis not present

## 2020-03-20 DIAGNOSIS — G459 Transient cerebral ischemic attack, unspecified: Secondary | ICD-10-CM | POA: Diagnosis not present

## 2020-03-20 LAB — ECHOCARDIOGRAM COMPLETE BUBBLE STUDY
Area-P 1/2: 2.39 cm2
S' Lateral: 3.3 cm

## 2020-03-20 MED ORDER — SODIUM CHLORIDE 0.9% FLUSH
10.0000 mL | INTRAVENOUS | Status: AC | PRN
  Administered 2020-03-20: 20 mL via INTRAVENOUS

## 2020-03-24 ENCOUNTER — Other Ambulatory Visit: Payer: Self-pay

## 2020-03-24 ENCOUNTER — Encounter: Payer: Self-pay | Admitting: Cardiology

## 2020-03-24 ENCOUNTER — Ambulatory Visit: Payer: Medicare PPO | Admitting: Cardiology

## 2020-03-24 ENCOUNTER — Telehealth: Payer: Self-pay | Admitting: Cardiology

## 2020-03-24 VITALS — BP 98/54 | HR 56 | Ht 70.0 in | Wt 164.0 lb

## 2020-03-24 DIAGNOSIS — G459 Transient cerebral ischemic attack, unspecified: Secondary | ICD-10-CM

## 2020-03-24 DIAGNOSIS — F341 Dysthymic disorder: Secondary | ICD-10-CM | POA: Diagnosis not present

## 2020-03-24 NOTE — Progress Notes (Addendum)
Electrophysiology Office Note   Date:  03/24/2020   ID:  Zachary Ponce, DOB 1951/03/25, MRN [id number]  PCP:  [name], MD  Cardiologist:  Bing Matter Primary Electrophysiologist:  Alleen Kehm Jorja Loa, MD    Chief Complaint: CVA   History of Present Illness: Zachary Ponce is a 69 y.o. male who is being seen today for the evaluation of CVA at the request of [name]. Presenting today for electrophysiology evaluation.  Zachary Ponce has a history of coronary artery disease status post CABG in 2019, bilateral carotid artery disease, hyperlipidemia, depression, anxiety, paroxysmal atrial fibrillation noted after surgery.  In June, Zachary Ponce went to the barber where Zachary Ponce started to feel dizzy.  Zachary Ponce had no chest pain or palpitations.  Zachary Ponce got sweaty and started to vomit.  On EMS arrival, it was found that Zachary Ponce could not move Zachary Ponce right arm.  The sensation lasted approximately 10 minutes.  Zachary Ponce had an MRI that showed no evidence of stroke.  Zachary Ponce wore a 2-week monitor that showed no atrial fibrillation.  Today, Zachary Ponce denies symptoms of palpitations, chest pain, shortness of breath, orthopnea, PND, lower extremity edema, claudication, dizziness, presyncope, syncope, bleeding, or neurologic sequela. The patient is tolerating medications without difficulties.    Past Medical History:  Diagnosis Date  . ADHD (attention deficit hyperactivity disorder) 03/03/2018  . 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 Bilateral carotid artery disease ([facility]) 03/07/2018   Left CEA 03/08/18 at time of CABG   . CAD (coronary artery disease) 03/03/2018   Cardiac cath  03/03/18 70% distal left main, 99% proximal LAD, 40-50% proximal circumflex, 60% intermediate, 90% ostial right coronary artery, 55% LVEF with some mild distal anterior hypokinesis  . Depression 06/30/2016  . Dyslipidemia 07/24/2015   The 10-year ASCVD risk score Denman George DC Montez Hageman., et al., 2013) is: 15.7%   Values used to calculate the score:     Age: 66 years     Sex: Male      Is Non-Hispanic African American: No     Diabetic: No     Tobacco smoker: No     Systolic Blood Pressure: 114 mmHg     Is BP treated: No     HDL Cholesterol: 32 mg/dL     Total Cholesterol: 209 mg/dL The 87-FIEP ASCVD risk score [name], et al., 2013) is  . Essential tremor   . Generalized anxiety disorder 01/22/2016  . History of traumatic head injury   . Hyperlipidemia 03/03/2018  . Hypogonadism in male 06/20/2018  . Hypotension after procedure 06/22/2018  . Memory loss 06/30/2016  . On amiodarone therapy 06/22/2018  . PAF (paroxysmal atrial fibrillation) ([facility]) 06/22/2018  . Post concussion syndrome 06/30/2016  . Post-concussion headache 06/30/2016  . 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 TIA (transient ischemic attack) 01/18/2020   Past Surgical History:  Procedure Laterality Date  . ANKLE SURGERY    . CORONARY ARTERY BYPASS GRAFT N/A 03/08/2018   Procedure: CORONARY ARTERY BYPASS GRAFTING (CABG) X4, RIGHT SAPHENOUS VEIN HARVEST. LIMA TO LAD, SVG TO OM, SVG TO RAMUS, SVG TO PD.;  Surgeon: [name], MD;  Location: [facility] OR;  Service: Open Heart Surgery;  Laterality: N/A;  . ENDARTERECTOMY Left 03/08/2018   Procedure: ENDARTERECTOMY CAROTID;  Surgeon: [name], MD;  Location: [facility] OR;  Service: Vascular;  Laterality: Left;  . KNEE SURGERY    . LEFT HEART CATH AND CORONARY ANGIOGRAPHY N/A 03/03/2018   Procedure: LEFT HEART CATH AND CORONARY ANGIOGRAPHY;  Surgeon:  [name], MD;  Location: [facility] INVASIVE CV LAB;  Service: Cardiovascular;  Laterality: N/A;  . TEE WITHOUT CARDIOVERSION N/A 03/08/2018   Procedure: TRANSESOPHAGEAL ECHOCARDIOGRAM (TEE);  Surgeon: [name], [name], MD;  Location: [facility] OR;  Service: Open Heart Surgery;  Laterality: N/A;  . TONSILLECTOMY       Current Outpatient Medications  Medication Sig Dispense Refill  . atorvastatin (LIPITOR) 20 MG tablet TAKE 1 TABLET(20 MG) BY MOUTH DAILY AT 6 PM 90 tablet 1  . buPROPion (WELLBUTRIN XL) 300 MG 24 hr tablet Take 300 mg by  mouth daily.    . clopidogrel (PLAVIX) 75 MG tablet Take 1 tablet (75 mg total) by mouth daily. 30 tablet 5  . escitalopram (LEXAPRO) 5 MG tablet Take 5 mg by mouth daily.  0  . NON FORMULARY Apply 50 mg topically daily. Testosterone 50mg /ml  0  . propranolol ER (INDERAL LA) 80 MG 24 hr capsule Take 1 capsule (80 mg total) by mouth daily. 90 capsule 3   No current facility-administered medications for this visit.   Facility-Administered Medications Ordered in Other Visits  Medication Dose Route Frequency Provider Last Rate Last Admin  . sodium chloride flush (NS) 0.9 % injection 10 mL  10 mL Intravenous PRN Jaffe, Adam R, DO   20 mL at 03/20/20 1125    Allergies:   Patient has no known allergies.   Social History:  The patient  reports that Zachary Ponce has never smoked. Zachary Ponce has never used smokeless tobacco. Zachary Ponce reports previous alcohol use. Zachary Ponce reports that Zachary Ponce does not use drugs.   Family History:  The patient's family history includes Diabetes in Zachary Ponce brother; Lung cancer in Zachary Ponce mother.    ROS:  Please see the history of present illness.   Otherwise, review of systems is positive for none.   All other systems are reviewed and negative.    PHYSICAL EXAM: VS:  BP (!) 98/54   Pulse (!) 56   Ht 5\' 10"  (1.778 m)   Wt 164 lb (74.4 kg)   BMI 23.53 kg/m  , BMI Body mass index is 23.53 kg/m. GEN: Well nourished, well developed, in no acute distress  HEENT: normal  Neck: no JVD, carotid bruits, or masses Cardiac: RRR; no murmurs, rubs, or gallops,no edema  Respiratory:  clear to auscultation bilaterally, normal work of breathing GI: soft, nontender, nondistended, + BS MS: no deformity or atrophy  Skin: warm and dry Neuro:  Strength and sensation are intact Psych: euthymic mood, full affect  EKG:  EKG is ordered today. Personal review of the ekg ordered shows sinus rhythm, rate 56, sinus rhythm and  Recent Labs: 08/21/2019: ALT 28; BUN 24; Creatinine, Ser 1.01; Potassium 4.8; Sodium 139     Lipid Panel     Component Value Date/Time   CHOL 106 08/21/2019 1503   TRIG 102 08/21/2019 1503   HDL 38 (L) 08/21/2019 1503   CHOLHDL 2.8 08/21/2019 1503   LDLCALC 49 08/21/2019 1503     Wt Readings from Last 3 Encounters:  03/24/20 164 lb (74.4 kg)  02/22/20 161 lb (73 kg)  02/20/20 159 lb (72.1 kg)      Other studies Reviewed: Additional studies/ records that were reviewed today include: TTE 03/20/20  Review of the above records today demonstrates:  1. Left ventricular ejection fraction, by estimation, is 60 to 65%. The  left ventricle has normal function. The left ventricle has no regional  wall motion abnormalities. Left ventricular diastolic parameters are  consistent with Grade I diastolic  dysfunction (impaired relaxation). The average left ventricular global  longitudinal strain is -23.4 %. The global longitudinal strain is normal.  2. Right ventricular systolic function is normal. The right ventricular  size is normal.  3. Left atrial size was moderately dilated.  4. The mitral valve is grossly normal. Trivial mitral valve  regurgitation.  5. The aortic valve is tricuspid. Aortic valve regurgitation is not  visualized.  6. Aortic dilatation noted. There is borderline dilatation of the  ascending aorta measuring 38 mm.  7. The inferior vena cava is normal in size with greater than 50%  respiratory variability, suggesting right atrial pressure of 3 mmHg.  8. Agitated saline contrast bubble study was negative, with no evidence  of any interatrial shunt.   Cardiac monitor 01/18/2020 personally reviewed First-degree as well as second-degree type I AV block noted during the night. Otherwise benign Holter  ASSESSMENT AND PLAN:  1.  Transient ischemic attach: Echo without major abnormality.  Zachary Ponce wore a 2-week monitor that showed no evidence of atrial fibrillation.  Unfortunately insurance is requiring to wear a monitor for a total of 30 days.  I Shareese Macha  discuss this with Zachary Ponce insurance company to see if we can avoid the 30-day monitor and go ahead and plan for Linq.  If this is unrevealing, Kiwana Deblasi plan for Linq monitor implant.  Risks and benefits were discussed include bleeding and infection.  Zachary Ponce understands these risks and has agreed to the procedure.  2.  Coronary artery disease: Status post CABG.  No current chest pain.  Zachary Ponce did have atrial fibrillation after Zachary Ponce bypass surgery but no further episodes.  3.  Hyperlipidemia: Currently on Lipitor.  LDL goal less than 70.  Plan per primary cardiology.  Case discussed with primary cardiology  Current medicines are reviewed at length with the patient today.   The patient does not have concerns regarding Zachary Ponce medicines.  The following changes were made today:  none  Labs/ tests ordered today include: Orders Placed This Encounter  Procedures  . EKG 12-Lead     Disposition:   FU with Kolter Reaver pending link monitor results  Signed, Ramonia Mcclaran Jorja Loa, MD  03/24/2020 9:56 AM     [facility] [address] [place] [address] [phone] (office) [phone] (fax)

## 2020-03-24 NOTE — Telephone Encounter (Signed)
Pt calls back concerning Dr. Elberta Fortis statement "He had an MRI that showed no evidence of stroke". He would like to know if he would re-phrase wording of statement, b/c that makes it seem like he did not have a stroke and worried insurance may have a problem w/ that. informed pt that I don't believe it should cause an issue w/ insurance but that I would forward to [facility] and ask that he addend encounter note.  Pt would like it to say something like "MRI showed no evidence of stroke but his doctors were unable to determine when his stroke occurred". Pt aware that I will forward to Dr. Elberta Fortis for addendum   (pt informed ILR implant in-office will be covered by insurance. Scheduled pt for 8/3)

## 2020-03-24 NOTE — Telephone Encounter (Signed)
  Patient is calling because he found a discrepancy on his visit summary for his visit today. He would like to speak to the nurse regarding this issue to make sure the correct information is noted. Please call

## 2020-03-26 ENCOUNTER — Telehealth: Payer: Self-pay

## 2020-03-26 NOTE — Telephone Encounter (Signed)
-----   Message from Georgeanna Lea, MD sent at 03/26/2020 12:16 PM EDT ----- Monitor showed some first-degree and second-degree AV block.  This is not dangerous.  We will continue present management

## 2020-03-26 NOTE — Telephone Encounter (Signed)
Patient had TIA and not stroke. Updated my note to reflect this.

## 2020-03-26 NOTE — Telephone Encounter (Signed)
Spoke with patient regarding results and recommendation.  Patient verbalizes understanding and is agreeable to plan of care. Advised patient to call back with any issues or concerns.  

## 2020-03-27 DIAGNOSIS — F341 Dysthymic disorder: Secondary | ICD-10-CM | POA: Diagnosis not present

## 2020-03-28 NOTE — Telephone Encounter (Signed)
lmtcb

## 2020-03-31 DIAGNOSIS — F341 Dysthymic disorder: Secondary | ICD-10-CM | POA: Diagnosis not present

## 2020-03-31 NOTE — Telephone Encounter (Signed)
Follow Up:      Pt is returning your call from Friday. He says if he does not answer, please leave a detailed message.

## 2020-03-31 NOTE — Telephone Encounter (Signed)
Dr. Elberta Fortis --- pt is scheduled for ILR implant on 8/3 in-office. Does he still need ILR for TIA, per pt? Pt aware I will let him know once advised by Dr. Elberta Fortis

## 2020-04-01 ENCOUNTER — Ambulatory Visit: Payer: Medicare PPO | Admitting: Neurology

## 2020-04-01 NOTE — Telephone Encounter (Signed)
TIA has the same physiology as stroke and thus it would be indicated.

## 2020-04-02 NOTE — Telephone Encounter (Signed)
Pt made aware of recommendation and is agreeable to proceeding with implant next week.

## 2020-04-03 DIAGNOSIS — F341 Dysthymic disorder: Secondary | ICD-10-CM | POA: Diagnosis not present

## 2020-04-07 ENCOUNTER — Ambulatory Visit: Payer: Medicare PPO | Admitting: Neurology

## 2020-04-07 DIAGNOSIS — F341 Dysthymic disorder: Secondary | ICD-10-CM | POA: Diagnosis not present

## 2020-04-08 ENCOUNTER — Ambulatory Visit: Payer: Medicare PPO | Admitting: Cardiology

## 2020-04-08 ENCOUNTER — Encounter: Payer: Self-pay | Admitting: Cardiology

## 2020-04-08 ENCOUNTER — Other Ambulatory Visit: Payer: Self-pay

## 2020-04-08 VITALS — BP 102/50 | HR 51 | Ht 70.0 in | Wt 165.0 lb

## 2020-04-08 DIAGNOSIS — G459 Transient cerebral ischemic attack, unspecified: Secondary | ICD-10-CM | POA: Diagnosis not present

## 2020-04-08 DIAGNOSIS — E785 Hyperlipidemia, unspecified: Secondary | ICD-10-CM | POA: Diagnosis not present

## 2020-04-08 DIAGNOSIS — I2510 Atherosclerotic heart disease of native coronary artery without angina pectoris: Secondary | ICD-10-CM | POA: Diagnosis not present

## 2020-04-08 NOTE — Progress Notes (Signed)
Electrophysiology Office Note   Date:  04/08/2020   ID:  Zachary Ponce, DOB [birth year], MRN [id number]  PCP:  [name], MD  Cardiologist:  Bing Matter Primary Electrophysiologist:  Trevious Rampey Jorja Loa, MD    Chief Complaint: CVA   History of Present Illness: Zachary Ponce is a 69 y.o. male who is being seen today for the evaluation of CVA at the request of [name]. Presenting today for electrophysiology evaluation.  He has a history of coronary artery disease status post CABG in 2019, bilateral carotid artery disease, hyperlipidemia, depression, anxiety, paroxysmal atrial fibrillation noted after surgery.  In June, he went to the barber where he started to feel dizzy.  He had no chest pain or palpitations.  He got sweaty and started to vomit.  On EMS arrival, it was found that he could not move his right arm.  The sensation lasted approximately 10 minutes.  He had an MRI that showed no evidence of stroke.  He wore a 2-week monitor that showed no atrial fibrillation.  Today, denies symptoms of palpitations, chest pain, shortness of breath, orthopnea, PND, lower extremity edema, claudication, dizziness, presyncope, syncope, bleeding, or neurologic sequela. The patient is tolerating medications without difficulties.  Plan for Linq monitor implant today for history of TIA.   Past Medical History:  Diagnosis Date  . ADHD (attention deficit hyperactivity disorder) 03/03/2018  . 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 Bilateral carotid artery disease (HCC) 03/07/2018   Left CEA 03/08/18 at time of CABG   . CAD (coronary artery disease) 03/03/2018   Cardiac cath  03/03/18 70% distal left main, 99% proximal LAD, 40-50% proximal circumflex, 60% intermediate, 90% ostial right coronary artery, 55% LVEF with some mild distal anterior hypokinesis  . Depression 06/30/2016  . Dyslipidemia 07/24/2015   The 10-year ASCVD risk score Denman George DC Montez Hageman., et al., 2013) is: 15.7%   Values used to  calculate the score:     Age: 79 years     Sex: Male     Is Non-Hispanic African American: No     Diabetic: No     Tobacco smoker: No     Systolic Blood Pressure: 114 mmHg     Is BP treated: No     HDL Cholesterol: 32 mg/dL     Total Cholesterol: 209 mg/dL The 90-WIOX ASCVD risk score [name], et al., 2013) is  . Essential tremor   . Generalized anxiety disorder 01/22/2016  . History of traumatic head injury   . Hyperlipidemia 03/03/2018  . Hypogonadism in male 06/20/2018  . Hypotension after procedure 06/22/2018  . Memory loss 06/30/2016  . On amiodarone therapy 06/22/2018  . PAF (paroxysmal atrial fibrillation) (HCC) 06/22/2018  . Post concussion syndrome 06/30/2016  . Post-concussion headache 06/30/2016  . 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 TIA (transient ischemic attack) 01/18/2020   Past Surgical History:  Procedure Laterality Date  . ANKLE SURGERY    . CORONARY ARTERY BYPASS GRAFT N/A 03/08/2018   Procedure: CORONARY ARTERY BYPASS GRAFTING (CABG) X4, RIGHT SAPHENOUS VEIN HARVEST. LIMA TO LAD, SVG TO OM, SVG TO RAMUS, SVG TO PD.;  Surgeon: [name], MD;  Location: [facility] OR;  Service: Open Heart Surgery;  Laterality: N/A;  . ENDARTERECTOMY Left 03/08/2018   Procedure: ENDARTERECTOMY CAROTID;  Surgeon: [name], MD;  Location: [facility] OR;  Service: Vascular;  Laterality: Left;  . KNEE SURGERY    . LEFT HEART CATH AND CORONARY ANGIOGRAPHY N/A 03/03/2018  Procedure: LEFT HEART CATH AND CORONARY ANGIOGRAPHY;  Surgeon: [name], MD;  Location: [facility] INVASIVE CV LAB;  Service: Cardiovascular;  Laterality: N/A;  . TEE WITHOUT CARDIOVERSION N/A 03/08/2018   Procedure: TRANSESOPHAGEAL ECHOCARDIOGRAM (TEE);  Surgeon: [name], [name], MD;  Location: [facility] OR;  Service: Open Heart Surgery;  Laterality: N/A;  . TONSILLECTOMY       Current Outpatient Medications  Medication Sig Dispense Refill  . atorvastatin (LIPITOR) 20 MG tablet TAKE 1 TABLET(20 MG) BY MOUTH DAILY AT 6 PM 90 tablet 1  .  buPROPion (WELLBUTRIN XL) 300 MG 24 hr tablet Take 300 mg by mouth daily.    . clopidogrel (PLAVIX) 75 MG tablet Take 1 tablet (75 mg total) by mouth daily. 30 tablet 5  . escitalopram (LEXAPRO) 5 MG tablet Take 5 mg by mouth daily.  0  . NON FORMULARY Apply 50 mg topically daily. Testosterone 50mg /ml  0  . propranolol ER (INDERAL LA) 80 MG 24 hr capsule Take 1 capsule (80 mg total) by mouth daily. 90 capsule 3   No current facility-administered medications for this visit.   Facility-Administered Medications Ordered in Other Visits  Medication Dose Route Frequency Provider Last Rate Last Admin  . sodium chloride flush (NS) 0.9 % injection 10 mL  10 mL Intravenous PRN Jaffe, Adam R, DO   20 mL at 03/20/20 1125    Allergies:   Patient has no known allergies.   Social History:  The patient  reports that he has never smoked. He has never used smokeless tobacco. He reports previous alcohol use. He reports that he does not use drugs.   Family History:  The patient's family history includes Diabetes in his brother; Lung cancer in his mother.   ROS:  Please see the history of present illness.   Otherwise, review of systems is positive for none.   All other systems are reviewed and negative.   PHYSICAL EXAM: VS:  BP (!) 102/50   Pulse (!) 51   Ht 5\' 10"  (1.778 m)   Wt 165 lb (74.8 kg)   SpO2 98%   BMI 23.68 kg/m  , BMI Body mass index is 23.68 kg/m. GEN: Well nourished, well developed, in no acute distress  HEENT: normal  Neck: no JVD, carotid bruits, or masses Cardiac: RRR; no murmurs, rubs, or gallops,no edema  Respiratory:  clear to auscultation bilaterally, normal work of breathing GI: soft, nontender, nondistended, + BS MS: no deformity or atrophy  Skin: warm and dry Neuro:  Strength and sensation are intact Psych: euthymic mood, full affect  EKG:  EKG is not ordered today. Personal review of the ekg ordered 03/24/20 shows sinus rhythm  Recent Labs: 08/21/2019: ALT 28; BUN 24;  Creatinine, Ser 1.01; Potassium 4.8; Sodium 139    Lipid Panel     Component Value Date/Time   CHOL 106 08/21/2019 1503   TRIG 102 08/21/2019 1503   HDL 38 (L) 08/21/2019 1503   CHOLHDL 2.8 08/21/2019 1503   LDLCALC 49 08/21/2019 1503     Wt Readings from Last 3 Encounters:  04/08/20 165 lb (74.8 kg)  03/24/20 164 lb (74.4 kg)  02/22/20 161 lb (73 kg)      Other studies Reviewed: Additional studies/ records that were reviewed today include: TTE 03/20/20  Review of the above records today demonstrates:  1. Left ventricular ejection fraction, by estimation, is 60 to 65%. The  left ventricle has normal function. The left ventricle has no regional  wall  motion abnormalities. Left ventricular diastolic parameters are  consistent with Grade I diastolic  dysfunction (impaired relaxation). The average left ventricular global  longitudinal strain is -23.4 %. The global longitudinal strain is normal.  2. Right ventricular systolic function is normal. The right ventricular  size is normal.  3. Left atrial size was moderately dilated.  4. The mitral valve is grossly normal. Trivial mitral valve  regurgitation.  5. The aortic valve is tricuspid. Aortic valve regurgitation is not  visualized.  6. Aortic dilatation noted. There is borderline dilatation of the  ascending aorta measuring 38 mm.  7. The inferior vena cava is normal in size with greater than 50%  respiratory variability, suggesting right atrial pressure of 3 mmHg.  8. Agitated saline contrast bubble study was negative, with no evidence  of any interatrial shunt.   Cardiac monitor 01/18/2020 personally reviewed First-degree as well as second-degree type I AV block noted during the night. Otherwise benign Holter  ASSESSMENT AND PLAN:  1.  Transient ischemic attack: Has had a work-up without major abnormality.  Was plan for Linq monitor implant.  Risks and benefits were discussed include bleeding and infection.  The  patient understands these risks and is agreed to the procedure.    2.  Coronary artery disease: Status post CABG.  No current chest pain.    3.  Hyperlipidemia: Currently on Lipitor.  Plan per primary cardiology.   Current medicines are reviewed at length with the patient today.   The patient does not have concerns regarding his medicines.  The following changes were made today: None  Labs/ tests ordered today include: No orders of the defined types were placed in this encounter.    Disposition:   FU with Mason Dibiasio pending link monitor results  Signed, Keyauna Graefe Jorja Loa, MD  04/08/2020 4:25 PM     [facility] [address] [place] [address] [phone] (office) [phone] (fax)  SURGEON:  Loman Brooklyn, MD     PREPROCEDURE DIAGNOSIS: Transient ischemic attack    POSTPROCEDURE DIAGNOSIS: Transient ischemic attack     PROCEDURES:   1. Implantable loop recorder implantation    INTRODUCTION:  RODRECUS BELSKY is a 69 y.o. male with a history of unexplained stroke who presents today for implantable loop implantation.  The patient has had a TIA.  Despite an extensive workup by neurology, no reversible causes have been identified.  he has worn telemetry during which he did not have arrhythmias.  There is significant concern for possible atrial fibrillation as the cause for the patients stroke.  The patient therefore presents today for implantable loop implantation.     DESCRIPTION OF PROCEDURE:  Informed written consent was obtained, and the patient was brought to the electrophysiology lab in a fasting state.  The patient required no sedation for the procedure today.  Mapping over the patient's chest was performed by the EP lab staff to identify the area where electrograms were most prominent for ILR recording.  This area was found to be the left parasternal region over the 3rd-4th intercostal space. The patients left chest was therefore prepped and  draped in the usual sterile fashion by the EP lab staff. The skin overlying the left parasternal region was infiltrated with lidocaine for local analgesia.  A 0.5-cm incision was made over the left parasternal region over the 3rd intercostal space.  A subcutaneous ILR pocket was fashioned using a combination of sharp and blunt dissection.  A Medtronic Reveal Linq model  LINQ SN [id number] implantable loop recorder was then placed into the pocket  R waves were very prominent and measured 0.6582mV. EBL<1 ml.  Steri- Strips and a sterile dressing were then applied.  There were no early apparent complications.     CONCLUSIONS:   1. Successful implantation of a Medtronic Reveal LINQ implantable loop recorder for TIA  2. No early apparent complications.

## 2020-04-08 NOTE — Patient Instructions (Addendum)
Medication Instructions:  Your physician recommends that you continue on your current medications as directed. Please refer to the Current Medication list given to you today.  Labwork: None ordered.  Testing/Procedures: None ordered.  Follow-Up:  You will have a virtual wound check:   April 17, 2020 at 3:30 PM via MyChart with the device clinic   Your physician wants you to follow-up in: as needed with Dr. Elberta Fortis.      Implantable Loop Recorder Placement, Care After This sheet gives you information about how to care for yourself after your procedure. Your health care provider may also give you more specific instructions. If you have problems or questions, contact your health care provider. What can I expect after the procedure? After the procedure, it is common to have:  Soreness or discomfort near the incision.  Some swelling or bruising near the incision.  Follow these instructions at home: Incision care  1.  Leave your outer dressing on for 24 hours.  After 24 hours you can remove your outer dressing.  Do NOT shower until 72 hours after implant.  2. Leave adhesive strips in place. These skin closures may need to stay in place for 1-2 weeks. If adhesive strip edges start to loosen and curl up, you may trim the loose edges.  You may remove the strips if they have not fallen off after 2 weeks. 3. Check your incision area every day for signs of infection. Check for: a. Redness, swelling, or pain. b. Fluid or blood. c. Warmth. d. Pus or a bad smell. 4. Do not take baths, swim, or use a hot tub until your incision is completely healed. 5. If your wound site starts to bleed apply pressure.      If you have any questions/concerns please call the device clinic at [phone].  Activity  Return to your normal activities.  General instructions  Follow instructions from your health care provider about how to manage your implantable loop recorder and transmit the information.  Learn how to activate a recording if this is necessary for your type of device.  Do not go through a metal detection gate, and do not let someone hold a metal detector over your chest. Show your ID card.  Do not have an MRI unless you check with your health care provider first.  Take over-the-counter and prescription medicines only as told by your health care provider.  Keep all follow-up visits as told by your health care provider. This is important. Contact a health care provider if:  You have redness, swelling, or pain around your incision.  You have a fever.  You have pain that is not relieved by your pain medicine.  You have triggered your device because of fainting (syncope) or because of a heartbeat that feels like it is racing, slow, fluttering, or skipping (palpitations). Get help right away if you have:  Chest pain.  Difficulty breathing. Summary  After the procedure, it is common to have soreness or discomfort near the incision.  Change your dressing as told by your health care provider.  Follow instructions from your health care provider about how to manage your implantable loop recorder and transmit the information.  Keep all follow-up visits as told by your health care provider. This is important. This information is not intended to replace advice given to you by your health care provider. Make sure you discuss any questions you have with your health care provider. Document Released: 08/04/2015 Document Revised: 10/08/2017 Document Reviewed: 10/08/2017 Elsevier  Patient Education  [employer].

## 2020-04-10 DIAGNOSIS — F341 Dysthymic disorder: Secondary | ICD-10-CM | POA: Diagnosis not present

## 2020-04-14 DIAGNOSIS — F341 Dysthymic disorder: Secondary | ICD-10-CM | POA: Diagnosis not present

## 2020-04-16 ENCOUNTER — Telehealth: Payer: Self-pay | Admitting: Cardiology

## 2020-04-16 ENCOUNTER — Telehealth: Payer: Self-pay

## 2020-04-16 NOTE — Telephone Encounter (Signed)
That is fine with me , switch to virtual

## 2020-04-16 NOTE — Telephone Encounter (Signed)
    Pt would like to know if he can change his appt with Dr. Kirtland Bouchard to virtual appt. He said he will be out of town and would like to keep his appt.

## 2020-04-16 NOTE — Telephone Encounter (Signed)
Pt requesting his 8/25 appt with Dr. Kirtland Bouchard be changed to virtual. Dr. Kirtland Bouchard approved. Pt aware the appointment will now be virtual.

## 2020-04-17 ENCOUNTER — Other Ambulatory Visit: Payer: Self-pay

## 2020-04-17 ENCOUNTER — Telehealth (INDEPENDENT_AMBULATORY_CARE_PROVIDER_SITE_OTHER): Payer: Medicare PPO | Admitting: Emergency Medicine

## 2020-04-17 DIAGNOSIS — F341 Dysthymic disorder: Secondary | ICD-10-CM | POA: Diagnosis not present

## 2020-04-17 DIAGNOSIS — G459 Transient cerebral ischemic attack, unspecified: Secondary | ICD-10-CM

## 2020-04-17 NOTE — Progress Notes (Signed)
Virtual ILR wound check. Steri strips removed by patient. Wound well healed. Home monitor transmitting nightly. No episodes. Questions answered.

## 2020-04-21 DIAGNOSIS — F341 Dysthymic disorder: Secondary | ICD-10-CM | POA: Diagnosis not present

## 2020-04-24 ENCOUNTER — Encounter (HOSPITAL_COMMUNITY): Payer: Medicare PPO

## 2020-04-24 ENCOUNTER — Ambulatory Visit: Payer: Medicare PPO

## 2020-04-24 DIAGNOSIS — F341 Dysthymic disorder: Secondary | ICD-10-CM | POA: Diagnosis not present

## 2020-04-28 DIAGNOSIS — F341 Dysthymic disorder: Secondary | ICD-10-CM | POA: Diagnosis not present

## 2020-04-29 DIAGNOSIS — F341 Dysthymic disorder: Secondary | ICD-10-CM | POA: Diagnosis not present

## 2020-04-30 ENCOUNTER — Encounter: Payer: Self-pay | Admitting: Cardiology

## 2020-04-30 ENCOUNTER — Telehealth (INDEPENDENT_AMBULATORY_CARE_PROVIDER_SITE_OTHER): Payer: Medicare PPO | Admitting: Cardiology

## 2020-04-30 VITALS — BP 110/61 | HR 61 | Wt 164.0 lb

## 2020-04-30 DIAGNOSIS — G459 Transient cerebral ischemic attack, unspecified: Secondary | ICD-10-CM | POA: Diagnosis not present

## 2020-04-30 DIAGNOSIS — I6523 Occlusion and stenosis of bilateral carotid arteries: Secondary | ICD-10-CM | POA: Diagnosis not present

## 2020-04-30 DIAGNOSIS — E785 Hyperlipidemia, unspecified: Secondary | ICD-10-CM

## 2020-04-30 DIAGNOSIS — Z951 Presence of aortocoronary bypass graft: Secondary | ICD-10-CM

## 2020-04-30 NOTE — Progress Notes (Signed)
Virtual Visit via Video Note   This visit type was conducted due to national recommendations for restrictions regarding the COVID-19 Pandemic (e.g. social distancing) in an effort to limit this patient's exposure and mitigate transmission in our community.  Due to his co-morbid illnesses, this patient is at least at moderate risk for complications without adequate follow up.  This format is felt to be most appropriate for this patient at this time.  All issues noted in this document were discussed and addressed.  A limited physical exam was performed with this format.  Please refer to the patient's chart for his consent to telehealth for [facility].     Evaluation Performed:  Follow-up visit  This visit type was conducted due to national recommendations for restrictions regarding the COVID-19 Pandemic (e.g. social distancing).  This format is felt to be most appropriate for this patient at this time.  All issues noted in this document were discussed and addressed.  No physical exam was performed (except for noted visual exam findings with Video Visits).  Please refer to the patient's chart (MyChart message for video visits and phone note for telephone visits) for the patient's consent to telehealth for [facility].  Date:  04/30/2020  ID: Zachary Ponce, DOB 02/25/1951, MRN [id number]   Patient Location: [address] [place] [address]   Provider location:   [facility] [place] [facility]  PCP:  [name], MD  Cardiologist:  [name], MD     Chief Complaint: I am doing well  History of Present Illness:    Zachary Ponce is a 69 y.o. male  who presents via audio/video conferencing for a telehealth visit today.  with past medical history significant for coronary artery disease, status post coronary bypass graft in 2019, bilateral carotid arterial stenosis follow-up with vascular surgeon but not critical, dyslipidemia, depression, anxiety he did have episode of  paroxysmal atrial fibrillation only after surgery.  I did see him few months ago after he had episodes of TIA.  Quite extensive evaluation has been done after that which was unrevealing.  He did wear monitor looking for atrial fibrillation which was negative.  Eventually we ended up putting implantable loop recording him.  He does have a video visit with me today.  [place]  Denies having any chest pain tightness squeezing pressure burning chest there is no dizziness.  He denies having any episode of this unexplained sweatiness that he had before.  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.   The patient does not have symptoms concerning for COVID-19 infection (fever, chills, cough, or new SHORTNESS OF BREATH).    Prior CV studies:   The following studies were reviewed today:       Past Medical History:  Diagnosis Date  . ADHD (attention deficit hyperactivity disorder) 03/03/2018  . 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 Bilateral carotid artery disease (HCC) 03/07/2018   Left CEA 03/08/18 at time of CABG   . CAD (coronary artery disease) 03/03/2018   Cardiac cath  03/03/18 70% distal left main, 99% proximal LAD, 40-50% proximal circumflex, 60% intermediate, 90% ostial right coronary artery, 55% LVEF with some mild distal anterior hypokinesis  . Depression 06/30/2016  . Dyslipidemia 07/24/2015   The 10-year ASCVD risk score [name], et al., 2013) is: 15.7%   Values used to calculate the score:     Age: 58 years  Sex: Male     Is Non-Hispanic African American: No     Diabetic: No     Tobacco smoker: No     Systolic Blood Pressure: 114 mmHg     Is BP treated: No     HDL Cholesterol: 32 mg/dL     Total Cholesterol: 209 mg/dL The 23-NTIR ASCVD risk score [name], et al., 2013) is  . Essential tremor   . Generalized anxiety disorder 01/22/2016  . History of traumatic head injury   . Hyperlipidemia 03/03/2018  . Hypogonadism in male  06/20/2018  . Hypotension after procedure 06/22/2018  . Memory loss 06/30/2016  . On amiodarone therapy 06/22/2018  . PAF (paroxysmal atrial fibrillation) (HCC) 06/22/2018  . Post concussion syndrome 06/30/2016  . Post-concussion headache 06/30/2016  . 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 TIA (transient ischemic attack) 01/18/2020    Past Surgical History:  Procedure Laterality Date  . ANKLE SURGERY    . CORONARY ARTERY BYPASS GRAFT N/A 03/08/2018   Procedure: CORONARY ARTERY BYPASS GRAFTING (CABG) X4, RIGHT SAPHENOUS VEIN HARVEST. LIMA TO LAD, SVG TO OM, SVG TO RAMUS, SVG TO PD.;  Surgeon: [name], MD;  Location: [facility] OR;  Service: Open Heart Surgery;  Laterality: N/A;  . ENDARTERECTOMY Left 03/08/2018   Procedure: ENDARTERECTOMY CAROTID;  Surgeon: [name], MD;  Location: [facility] OR;  Service: Vascular;  Laterality: Left;  . KNEE SURGERY    . LEFT HEART CATH AND CORONARY ANGIOGRAPHY N/A 03/03/2018   Procedure: LEFT HEART CATH AND CORONARY ANGIOGRAPHY;  Surgeon: [name], MD;  Location: [facility] INVASIVE CV LAB;  Service: Cardiovascular;  Laterality: N/A;  . TEE WITHOUT CARDIOVERSION N/A 03/08/2018   Procedure: TRANSESOPHAGEAL ECHOCARDIOGRAM (TEE);  Surgeon: [name], [name], MD;  Location: [facility] OR;  Service: Open Heart Surgery;  Laterality: N/A;  . TONSILLECTOMY       Current Meds  Medication Sig  . atorvastatin (LIPITOR) 20 MG tablet TAKE 1 TABLET(20 MG) BY MOUTH DAILY AT 6 PM  . buPROPion (WELLBUTRIN XL) 300 MG 24 hr tablet Take 300 mg by mouth daily.  . clopidogrel (PLAVIX) 75 MG tablet Take 1 tablet (75 mg total) by mouth daily.  [name] escitalopram (LEXAPRO) 5 MG tablet Take 5 mg by mouth daily.  . NON FORMULARY Apply 50 mg topically daily. Testosterone 50mg /ml  . propranolol ER (INDERAL LA) 80 MG 24 hr capsule Take 1 capsule (80 mg total) by mouth daily.      Family History: The patient's family history includes Diabetes in his brother; Lung cancer in his mother.   ROS:   Please  see the history of present illness.     All other systems reviewed and are negative.   Labs/Other Tests and Data Reviewed:     Recent Labs: 08/21/2019: ALT 28; BUN 24; Creatinine, Ser 1.01; Potassium 4.8; Sodium 139  Recent Lipid Panel    Component Value Date/Time   CHOL 106 08/21/2019 1503   TRIG 102 08/21/2019 1503   HDL 38 (L) 08/21/2019 1503   CHOLHDL 2.8 08/21/2019 1503   LDLCALC 49 08/21/2019 1503      Exam:    Vital Signs:  BP 110/61   Pulse 61   Wt 164 lb (74.4 kg)   BMI 23.53 kg/m     Wt Readings from Last 3 Encounters:  04/30/20 164 lb (74.4 kg)  04/08/20 165 lb (74.8 kg)  03/24/20 164 lb (74.4 kg)     Well nourished, well developed  in no acute distress. Alert awake and x3 happy to be able to talk to me without any distress.  No chest pain at the time of conversation  Diagnosis for this visit:   1. TIA (transient ischemic attack)   2. S/P CABG x 4   3. Dyslipidemia   4. Bilateral carotid artery stenosis      ASSESSMENT & PLAN:    1.  History of TIA he does have implantable loop recorder.  We will continue monitoring his heart rhythm. 2.  Status post coronary bypass graft.  Stable on appropriate medication prescribed continue. 3.  Dyslipidemia: I did review his K PN which showed me his LDL of 40 and HDL of 38 this is from 08/21/2019 we will continue present management. 4.  Bilateral carotic arterial stenosis followed by vascular surgeon from [facility]  COVID-19 Education: The signs and symptoms of COVID-19 were discussed with the patient and how to seek care for testing (follow up with PCP or arrange E-visit).  The importance of social distancing was discussed today.  Patient Risk:   After full review of this patients clinical status, I feel that they are at least moderate risk at this time.  Time:   Today, I have spent 15 minutes with the patient with telehealth technology discussing pt health issues.  I spent 15 minutes reviewing her chart  before the visit.  Visit was finished at 2:16 PM.    Medication Adjustments/Labs and Tests Ordered: Current medicines are reviewed at length with the patient today.  Concerns regarding medicines are outlined above.  No orders of the defined types were placed in this encounter.  Medication changes: No orders of the defined types were placed in this encounter.    Disposition: Follow-up 5 months  Signed, Georgeanna Lea, MD, [facility] 04/30/2020 2:15 PM    [facility]

## 2020-04-30 NOTE — Patient Instructions (Signed)

## 2020-05-05 DIAGNOSIS — F341 Dysthymic disorder: Secondary | ICD-10-CM | POA: Diagnosis not present

## 2020-05-08 DIAGNOSIS — F341 Dysthymic disorder: Secondary | ICD-10-CM | POA: Diagnosis not present

## 2020-05-12 DIAGNOSIS — F341 Dysthymic disorder: Secondary | ICD-10-CM | POA: Diagnosis not present

## 2020-05-13 ENCOUNTER — Ambulatory Visit (INDEPENDENT_AMBULATORY_CARE_PROVIDER_SITE_OTHER): Payer: Medicare PPO | Admitting: *Deleted

## 2020-05-13 DIAGNOSIS — G459 Transient cerebral ischemic attack, unspecified: Secondary | ICD-10-CM | POA: Diagnosis not present

## 2020-05-13 LAB — CUP PACEART REMOTE DEVICE CHECK
Date Time Interrogation Session: 20210906152922
Implantable Pulse Generator Implant Date: 20210803

## 2020-05-15 NOTE — Progress Notes (Signed)
Carelink Summary Report / Loop Recorder 

## 2020-05-19 DIAGNOSIS — F341 Dysthymic disorder: Secondary | ICD-10-CM | POA: Diagnosis not present

## 2020-05-22 ENCOUNTER — Ambulatory Visit: Payer: Medicare PPO | Admitting: Physician Assistant

## 2020-05-22 ENCOUNTER — Ambulatory Visit (HOSPITAL_COMMUNITY)
Admission: RE | Admit: 2020-05-22 | Discharge: 2020-05-22 | Disposition: A | Discharge status: Home / Self Care | Payer: Medicare PPO | Source: Ambulatory Visit | Attending: Vascular Surgery | Admitting: Vascular Surgery

## 2020-05-22 ENCOUNTER — Other Ambulatory Visit: Payer: Self-pay

## 2020-05-22 VITALS — BP 116/59 | HR 54 | Temp 98.0°F | Resp 16 | Ht 70.0 in | Wt 166.0 lb

## 2020-05-22 DIAGNOSIS — F341 Dysthymic disorder: Secondary | ICD-10-CM | POA: Diagnosis not present

## 2020-05-22 DIAGNOSIS — I771 Stricture of artery: Secondary | ICD-10-CM | POA: Diagnosis not present

## 2020-05-22 DIAGNOSIS — I6523 Occlusion and stenosis of bilateral carotid arteries: Secondary | ICD-10-CM

## 2020-05-22 NOTE — Progress Notes (Signed)
HISTORY AND PHYSICAL     CC:  follow up. Requesting Provider:  [name], MD  HPI: This is a 69 y.o. male here for follow up for carotid artery stenosis.  Pt is s/p left CEA with combined CABG in July 2019 by Dr. Darrick Penna.    Pt was last seen May 2021 by Dr. Darrick Penna and at that time pt had had a TIA and had extensive workup with cardiology and was unrevealing.  He did wear a monitor looking for afib, which was negative.  An implantable loop recording device was eventually placed.  When he saw Dr. Bing Matter 04/30/2020, he was doing well without chest pain or dizziness.  He did not have anymore episodes of sweatiness that he had before.    Pt returns today for follow up.  Pt  any amaurosis fugax, speech difficulties, weakness, numbness, paralysis or clumsiness.  He recently bought a beach house at [facility] and he states that when he is doing work around the house and gets up on the ladder to change a light bulb or work on the smoke alarm, he has to go slow because he does get some dizziness.  He states his essential tremors are somewhat worse and he is not able to type on the keyboard any longer.   The pt is on a statin for cholesterol management.  The pt is not on a daily aspirin.   Other AC:  Plavix The pt is on BB for hypertension.   The pt is not diabetic.   Tobacco hx:  never  Pt does not have family hx of AAA.  Past Medical History:  Diagnosis Date  . ADHD (attention deficit hyperactivity disorder) 03/03/2018  . 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 Bilateral carotid artery disease (HCC) 03/07/2018   Left CEA 03/08/18 at time of CABG   . CAD (coronary artery disease) 03/03/2018   Cardiac cath  03/03/18 70% distal left main, 99% proximal LAD, 40-50% proximal circumflex, 60% intermediate, 90% ostial right coronary artery, 55% LVEF with some mild distal anterior hypokinesis  . Depression 06/30/2016  . Dyslipidemia 07/24/2015   The 10-year ASCVD risk score Denman George DC Montez Hageman., et al., 2013) is:  15.7%   Values used to calculate the score:     Age: 19 years     Sex: Male     Is Non-Hispanic African American: No     Diabetic: No     Tobacco smoker: No     Systolic Blood Pressure: 114 mmHg     Is BP treated: No     HDL Cholesterol: 32 mg/dL     Total Cholesterol: 209 mg/dL The 79-GXQJ ASCVD risk score [name], et al., 2013) is  . Essential tremor   . Generalized anxiety disorder 01/22/2016  . History of traumatic head injury   . Hyperlipidemia 03/03/2018  . Hypogonadism in male 06/20/2018  . Hypotension after procedure 06/22/2018  . Memory loss 06/30/2016  . On amiodarone therapy 06/22/2018  . PAF (paroxysmal atrial fibrillation) (HCC) 06/22/2018  . Post concussion syndrome 06/30/2016  . Post-concussion headache 06/30/2016  . 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 TIA (transient ischemic attack) 01/18/2020    Past Surgical History:  Procedure Laterality Date  . ANKLE SURGERY    . CORONARY ARTERY BYPASS GRAFT N/A 03/08/2018   Procedure: CORONARY ARTERY BYPASS GRAFTING (CABG) X4, RIGHT SAPHENOUS VEIN HARVEST. LIMA TO LAD, SVG TO OM, SVG TO RAMUS, SVG TO PD.;  Surgeon: [name], MD;  Location: [facility] OR;  Service: Open Heart Surgery;  Laterality: N/A;  . ENDARTERECTOMY Left 03/08/2018   Procedure: ENDARTERECTOMY CAROTID;  Surgeon: [name], MD;  Location[facility] OR;  Service: Vascular;  Laterality: Left;  . KNEE SURGERY    . LEFT HEART CATH AND CORONARY ANGIOGRAPHY N/A 03/03/2018   Procedure: LEFT HEART CATH AND CORONARY ANGIOGRAPHY;  Surgeon: [name], MD;  Location: [facility] INVASIVE CV LAB;  Service: Cardiovascular;  Laterality: N/A;  . TEE WITHOUT CARDIOVERSION N/A 03/08/2018   Procedure: TRANSESOPHAGEAL ECHOCARDIOGRAM (TEE);  Surgeon: [name], [name], MD;  Location: [facility] OR;  Service: Open Heart Surgery;  Laterality: N/A;  . TONSILLECTOMY      No Known Allergies  Current Outpatient Medications  Medication Sig Dispense Refill  . atorvastatin (LIPITOR) 20 MG tablet TAKE 1 TABLET(20 MG)  BY MOUTH DAILY AT 6 PM 90 tablet 1  . buPROPion (WELLBUTRIN XL) 300 MG 24 hr tablet Take 300 mg by mouth daily.    . clopidogrel (PLAVIX) 75 MG tablet Take 1 tablet (75 mg total) by mouth daily. 30 tablet 5  . escitalopram (LEXAPRO) 5 MG tablet Take 5 mg by mouth daily.  0  . NON FORMULARY Apply 50 mg topically daily. Testosterone 50mg /ml  0  . propranolol ER (INDERAL LA) 80 MG 24 hr capsule Take 1 capsule (80 mg total) by mouth daily. 90 capsule 3   No current facility-administered medications for this visit.   Facility-Administered Medications Ordered in Other Visits  Medication Dose Route Frequency Provider Last Rate Last Admin  . sodium chloride flush (NS) 0.9 % injection 10 mL  10 mL Intravenous PRN Jaffe, Adam R, DO   20 mL at 03/20/20 1125    Family History  Problem Relation Age of Onset  . Lung cancer Mother   . Diabetes Brother     Social History   Socioeconomic History  . Marital status: Married    Spouse name: Not on file  . Number of children: Not on file  . Years of education: Not on file  . Highest education level: Not on file  Occupational History  . Not on file  Tobacco Use  . Smoking status: Never Smoker  . Smokeless tobacco: Never Used  Vaping Use  . Vaping Use: Never used  Substance and Sexual Activity  . Alcohol use: Not Currently  . Drug use: Never  . Sexual activity: Yes  Other Topics Concern  . Not on file  Social History Narrative   Left handed   3 story home   Drink decaffaine   Social Determinants of Health   Financial Resource Strain:   . Difficulty of Paying Living Expenses: Not on file  Food Insecurity:   . Worried About 03/22/20 in the Last Year: Not on file  . Ran Out of Food in the Last Year: Not on file  Transportation Needs:   . Lack of Transportation (Medical): Not on file  . Lack of Transportation (Non-Medical): Not on file  Physical Activity:   . Days of Exercise per Week: Not on file  . Minutes of Exercise per  Session: Not on file  Stress:   . Feeling of Stress : Not on file  Social Connections:   . Frequency of Communication with Friends and Family: Not on file  . Frequency of Social Gatherings with Friends and Family: Not on file  . Attends Religious Services: Not on file  . Active Member of Clubs or Organizations: Not  on file  . Attends Banker Meetings: Not on file  . Marital Status: Not on file  Intimate Partner Violence:   . Fear of Current or Ex-Partner: Not on file  . Emotionally Abused: Not on file  . Physically Abused: Not on file  . Sexually Abused: Not on file     REVIEW OF SYSTEMS:   [X]  denotes positive finding, [ ]  denotes negative finding Cardiac  Comments:  Chest pain or chest pressure:    Shortness of breath upon exertion:    Short of breath when lying flat:    Irregular heart rhythm: x Possible-on monitor      Vascular    Pain in calf, thigh, or hip brought on by ambulation:    Pain in feet at night that wakes you up from your sleep:     Blood clot in your veins:    Leg swelling:         Pulmonary    Oxygen at home:    Productive cough:     Wheezing:         Neurologic    Sudden weakness in arms or legs:     Sudden numbness in arms or legs:     Sudden onset of difficulty speaking or slurred speech:    Temporary loss of vision in one eye:     Problems with dizziness:  x Infrequent-see HPI      Gastrointestinal    Blood in stool:     Vomited blood:         Genitourinary    Burning when urinating:     Blood in urine:        Psychiatric    Major depression:         Hematologic    Bleeding problems:    Problems with blood clotting too easily:        Skin    Rashes or ulcers:        Constitutional    Fever or chills:      PHYSICAL EXAMINATION:  Today's Vitals   05/22/20 1335 05/22/20 1339  BP: (!) 117/59 (!) 116/59  Pulse: (!) 54 (!) 54  Resp: 16   Temp: 98 F (36.7 C)   TempSrc: Temporal   SpO2: 99%   Weight: 166 lb  (75.3 kg)   Height: 5\' 10"  (1.778 m)    Body mass index is 23.82 kg/m.   General:  WDWN in NAD; vital signs documented above Gait: Not observed HENT: WNL, normocephalic Pulmonary: normal non-labored breathing Cardiac: regular HR, without murmur without carotid bruits Abdomen: soft, NT, no masses; aortic pulse is not palpable Skin: without rashes; well healed left CEA scar Vascular Exam/Pulses:  Right Left  Radial 2+ (normal) 1+ (weak)  Ulnar 2+ (normal) 1+ (weak)  Popliteal Unable to palpate Unable to palpate  DP 2+ (normal) 2+ (normal)  PT 2+ (normal) Unable to palpate   Extremities: without ischemic changes, without Gangrene , without cellulitis; without open wounds;  Musculoskeletal: no muscle wasting or atrophy  Neurologic: A&O X 3 Psychiatric:  The pt has Normal affect.   Non-Invasive Vascular Imaging:   Carotid Duplex on 05/22/2020: Right:  60-79%% ICA stenosis Left:  1-39% ICA stenosis Vertebrals: Bilateral vertebral arteries demonstrate antegrade flow.  Subclavians: Bilateral subclavian arteries were stenotic.   Previous Carotid duplex on 05/22/2020: Right: 60-79%% ICA stenosis Left:   1-39% ICA stenosis Bilateral vertebral arteries demonstrate antegrade flow.  Subclavians: Left subclavian artery was stenotic. Right  subclavian artery flow was disturbed.    ASSESSMENT/PLAN:: 69 y.o. male here for follow up carotid artery stenosis.  -duplex today reveals left 1-39% and right essentially unchanged at 60-79%.  He remains asymptomatic from this.   -discussed s/s of stroke with pt and he understands should he develop any of these sx, he will go to the nearest ER. -he does have bilateral subclavian artery stenosis.  He does get some dizziness when reaching up to work on smoke alarm or change light bulb.  He has palpable bilateral radial pulses.  Dr. Darrick PennaFields is off this week-I will discuss this with him next week.  Pt may need CTA chest or possibly arteriogram.  Discussed  with pt that I would call him next week.  -pt will f/u in 6 months with carotid duplex unless this plan changes after I have spoken with Dr. Darrick PennaFields.  -continue statin/plavix -pt will call sooner should they have any issues.   Doreatha MassedSamantha Jasmon Graffam, [facility] [phone]  Clinic MD:  Chestine Sporelark on call MD

## 2020-05-23 ENCOUNTER — Other Ambulatory Visit: Payer: Self-pay | Admitting: *Deleted

## 2020-05-23 DIAGNOSIS — I6523 Occlusion and stenosis of bilateral carotid arteries: Secondary | ICD-10-CM

## 2020-05-25 DIAGNOSIS — F341 Dysthymic disorder: Secondary | ICD-10-CM | POA: Diagnosis not present

## 2020-05-26 DIAGNOSIS — F341 Dysthymic disorder: Secondary | ICD-10-CM | POA: Diagnosis not present

## 2020-05-28 DIAGNOSIS — N5201 Erectile dysfunction due to arterial insufficiency: Secondary | ICD-10-CM | POA: Diagnosis not present

## 2020-05-28 DIAGNOSIS — E291 Testicular hypofunction: Secondary | ICD-10-CM | POA: Diagnosis not present

## 2020-05-29 ENCOUNTER — Telehealth: Payer: Self-pay | Admitting: Physician Assistant

## 2020-05-29 DIAGNOSIS — F341 Dysthymic disorder: Secondary | ICD-10-CM | POA: Diagnosis not present

## 2020-05-29 NOTE — Telephone Encounter (Signed)
Spoke with pt on 05/28/2020.  He informed me that he had a CTA back in July.  Spoke with Dr. Darrick Penna and he will see pt in the next couple of weeks.   Pt does not need CTA for his visit.    Doreatha Massed, [facility] 05/29/2020 1:24 PM

## 2020-06-02 DIAGNOSIS — F341 Dysthymic disorder: Secondary | ICD-10-CM | POA: Diagnosis not present

## 2020-06-05 DIAGNOSIS — F341 Dysthymic disorder: Secondary | ICD-10-CM | POA: Diagnosis not present

## 2020-06-06 ENCOUNTER — Telehealth: Payer: Self-pay | Admitting: Cardiology

## 2020-06-06 NOTE — Telephone Encounter (Signed)
Crystal with Dr. Elwyn Lade office called back and advised Dr. Retta Mac stated he would feel better with pt staying on Plavix for his dental procedure. I thanked Crystal for her help. I assured Crystal that I will update the cardiologist. We will send clearance once pt has been cleared.

## 2020-06-06 NOTE — Telephone Encounter (Signed)
Left message for the requesting office to call back in regards to Plavix.

## 2020-06-06 NOTE — Telephone Encounter (Signed)
   Primary Cardiologist: Georga Hacking, MD  Chart reviewed as part of pre-operative protocol coverage. Given past medical history and time since last visit, based on ACC/AHA guidelines, Zachary Ponce would be at acceptable risk for the planned procedure without further cardiovascular testing.   Patient will need to stay on his Plavix throughout his upcoming surgical procedure.  I will route this recommendation to the requesting party via Epic fax function and remove from pre-op pool.  Please call with questions.  Thomasene Ripple. Airyn Ellzey NP-C    06/06/2020, 4:14 PM [facility] [address] Office [phone] Fax [phone]

## 2020-06-06 NOTE — Telephone Encounter (Signed)
   [facility] Pre-operative Risk Assessment    HEARTCARE STAFF: - Please ensure there is not already an duplicate clearance open for this procedure. - Under Visit Info/Reason for Call, type in Other and utilize the format Clearance MM/DD/YY or Clearance TBD. Do not use dashes or single digits. - If request is for dental extraction, please clarify the # of teeth to be extracted.  Request for surgical clearance:  1. What type of surgery is being performed? Biopsy and Apicoectomy    2. When is this surgery scheduled? 07/01/20   3. What type of clearance is required (medical clearance vs. Pharmacy clearance to hold med vs. Both)? pharmacy  4. Are there any medications that need to be held prior to surgery and how long?Plavix 3-4 days prior   5. Practice name and name of physician performing surgery? Dr. Burnice Logan, [facility]   6. What is the office phone number? [phone]   7.   What is the office fax number? [phone]  8.   Anesthesia type (None, local, MAC, general) ? local   Johnna Acosta 06/06/2020, 11:55 AM  _________________________________________________________________   (provider comments below)

## 2020-06-06 NOTE — Telephone Encounter (Signed)
I s/w dental office and discussed per Dr. Bing Matter if the pt may have his dental procedure while continuing to stay on Plavix. See previous note asking to hold Plavix x 3-4 days prior to dental procedure. Dental office will confirm with Dr. Retta Mac and will call our office back. I gave her my direct extension [phone].

## 2020-06-06 NOTE — Telephone Encounter (Signed)
Do we really need to hold Plavix for dental procedure?  Do we expect significant bleeding from this procedure?  We can always hold Plavix for 3 to 4 days but it does carry some risk for potential TIA that he had, however always have to balance risk and benefits.  If dentist insists that the procedure cannot be done with Plavix on board should be fine to hold it for 3 to 4 days

## 2020-06-06 NOTE — Telephone Encounter (Signed)
Zachary Ponce 69 year old male is requesting biopsy and Apicoectomy.  He was last seen via virtual visit on 04/30/2020.  He had been cleaning his beach house and taking care of his car while he was at the beach.  He had no cardiac complaints at that time.  May his Plavix be held 3-4 days prior to his procedure?  His PMH includes CAD status post CABG in 2019, bilateral carotid artery disease, HLD, depression, anxiety, PAF noted after surgery.  In June, he went to his barber where he started to feel dizzy.  He denied chest pain or palpitations.  He got sweaty and started to vomit.  On EMS arrival, it was found that he could not move his right upper extremity.  The sensation lasted approximately 10 minutes.  He had an MRI that showed no evidence of CVA.  He wore a 2-week monitor that showed no atrial fibrillation.  Please direct response to CV DIV preop pool.  Thank you for your help.  Zachary Ponce. Zachary Pawlicki NP-C    06/06/2020, 12:50 PM [facility] [address] Office [phone] Fax [phone]

## 2020-06-09 DIAGNOSIS — F341 Dysthymic disorder: Secondary | ICD-10-CM | POA: Diagnosis not present

## 2020-06-12 DIAGNOSIS — F341 Dysthymic disorder: Secondary | ICD-10-CM | POA: Diagnosis not present

## 2020-06-16 ENCOUNTER — Ambulatory Visit (INDEPENDENT_AMBULATORY_CARE_PROVIDER_SITE_OTHER): Payer: Medicare PPO

## 2020-06-16 DIAGNOSIS — G459 Transient cerebral ischemic attack, unspecified: Secondary | ICD-10-CM

## 2020-06-16 DIAGNOSIS — F341 Dysthymic disorder: Secondary | ICD-10-CM | POA: Diagnosis not present

## 2020-06-16 LAB — CUP PACEART REMOTE DEVICE CHECK
Date Time Interrogation Session: 20211009153008
Implantable Pulse Generator Implant Date: 20210803

## 2020-06-18 NOTE — Progress Notes (Signed)
Carelink Summary Report / Loop Recorder 

## 2020-06-19 DIAGNOSIS — F341 Dysthymic disorder: Secondary | ICD-10-CM | POA: Diagnosis not present

## 2020-06-23 DIAGNOSIS — F341 Dysthymic disorder: Secondary | ICD-10-CM | POA: Diagnosis not present

## 2020-06-26 ENCOUNTER — Ambulatory Visit (INDEPENDENT_AMBULATORY_CARE_PROVIDER_SITE_OTHER): Payer: Medicare PPO | Admitting: Vascular Surgery

## 2020-06-26 ENCOUNTER — Encounter: Payer: Self-pay | Admitting: Vascular Surgery

## 2020-06-26 ENCOUNTER — Other Ambulatory Visit: Payer: Self-pay

## 2020-06-26 VITALS — BP 118/65 | HR 50 | Temp 97.8°F | Resp 20 | Ht 70.0 in | Wt 166.9 lb

## 2020-06-26 DIAGNOSIS — R42 Dizziness and giddiness: Secondary | ICD-10-CM | POA: Diagnosis not present

## 2020-06-26 DIAGNOSIS — F341 Dysthymic disorder: Secondary | ICD-10-CM | POA: Diagnosis not present

## 2020-06-26 NOTE — Progress Notes (Signed)
History present illness: Patient is a 69 year old male who returns for follow-up today.  He was last seen in our office September 16.  He has previously had a left carotid endarterectomy.  This was in July 2019.  He apparently was having some occasional dizziness and sent for a CT angiogram to further evaluate subclavian vertebral circulation.  He states that he gets dizzy if he is standing on a ladder and working above his head.  He has not had any new onset stroke symptoms.  He has not had any episodes of TIA or amaurosis.  He is on a statin and Plavix.  Current Outpatient Medications on File Prior to Visit  Medication Sig Dispense Refill  . atorvastatin (LIPITOR) 20 MG tablet TAKE 1 TABLET(20 MG) BY MOUTH DAILY AT 6 PM 90 tablet 1  . buPROPion (WELLBUTRIN XL) 300 MG 24 hr tablet Take 300 mg by mouth daily.    . clopidogrel (PLAVIX) 75 MG tablet Take 1 tablet (75 mg total) by mouth daily. 30 tablet 5  . escitalopram (LEXAPRO) 5 MG tablet Take 5 mg by mouth daily.  0  . NON FORMULARY Apply 50 mg topically daily. Testosterone 50mg /ml  0  . propranolol ER (INDERAL LA) 80 MG 24 hr capsule Take 1 capsule (80 mg total) by mouth daily. 90 capsule 3   Current Facility-Administered Medications on File Prior to Visit  Medication Dose Route Frequency Provider Last Rate Last Admin  . sodium chloride flush (NS) 0.9 % injection 10 mL  10 mL Intravenous PRN , Adam R, DO   20 mL at 03/20/20 1125   Physical exam:  Vitals:   06/26/20 1024 06/26/20 1026  BP: 132/69 118/65  Pulse: (!) 50   Resp: 20   Temp: 97.8 F (36.6 C)   SpO2: 99%   Weight: 166 lb 14.4 oz (75.7 kg)   Height: 5\' 10"  (1.778 m)     Extremities: 2+ radial pulses bilaterally  Neuro: Symmetric upper extremity lower extremity motor strength no facial asymmetry  Data: Patient had a carotid duplex exam performed on May 22, 2020.  I reviewed and interpreted the study today.  Right ICA was 60 to 80% left less than 40%.  Suggestion  that both subclavian arteries had some stenosis.  However both vertebral arteries had antegrade vertebral flow.  Patient had an MRI of the brain performed Jan 18, 2020 which showed a chronic left frontal infarct.  He had a CT angiogram of the head and neck performed on March 17, 2020.  This showed a 65 to 70% right internal carotid artery stenosis.  A moderate left vertebral origin stenosis a 30% right subclavian artery stenosis.  Assessment: Patient with moderate right internal carotid artery stenosis, asymptomatic, no significant right or left subclavian artery stenosis.  He does have a moderate left vertebral artery stenosis but I do not believe that this is significant enough to explain his dizziness symptoms.  Plan: Patient will follow up in 6 months time with repeat carotid duplex exam.  He will be seen in our APP clinic.  Jan 20, 2020, MD [facility] [place] Office: [phone]

## 2020-06-30 DIAGNOSIS — F341 Dysthymic disorder: Secondary | ICD-10-CM | POA: Diagnosis not present

## 2020-07-01 IMAGING — DX DG CHEST 1V PORT
1 series · 1 of 1 positions shown · non-contrast
Comparison: 03/09/2018.

CLINICAL DATA: CABG.  Sore chest.

EXAM:
PORTABLE CHEST 1 VIEW

[chest ap]
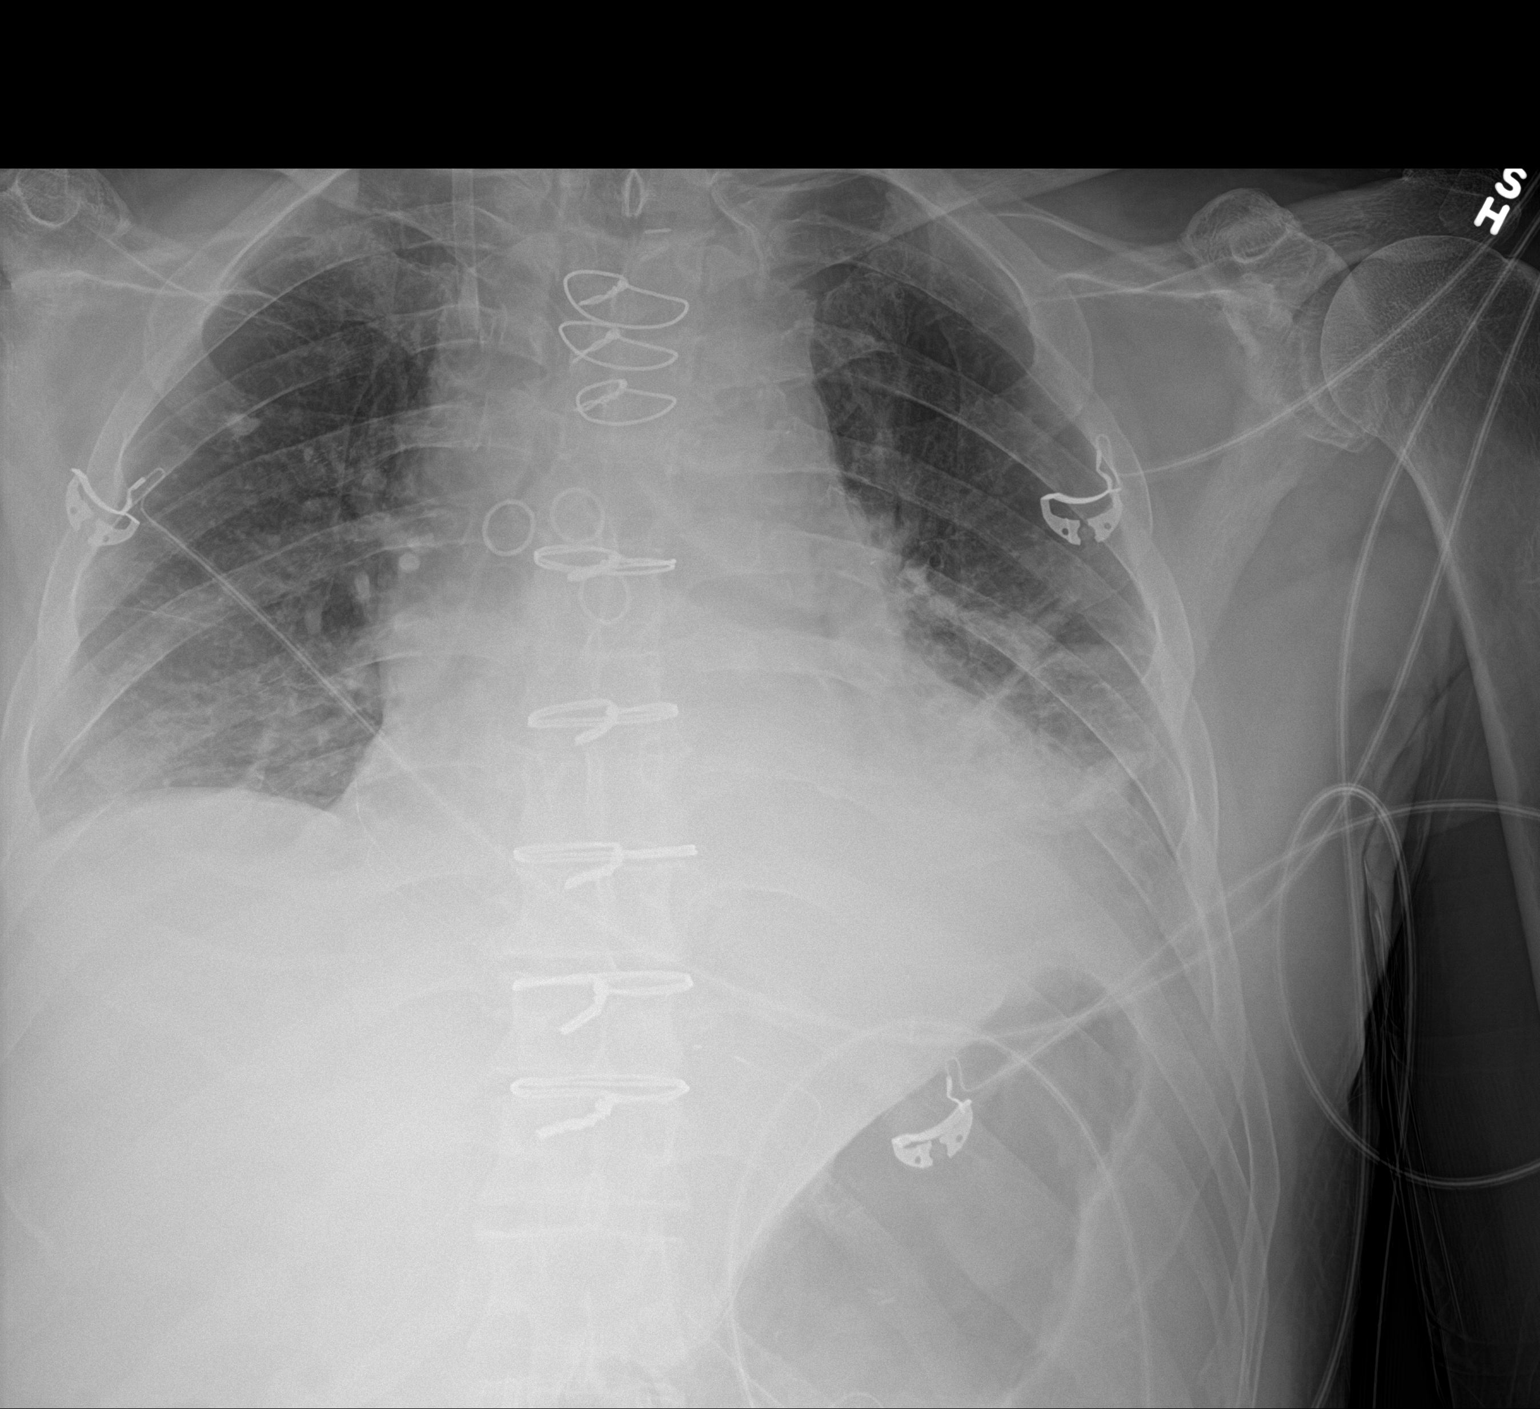

[1 of 1 positions shown; findings below may reference images not displayed]

FINDINGS: Right IJ sheath in stable position. Interim removal of Swan-Ganz
catheter, NG tube, and left chest tube. No pneumothorax. Prior CABG.
Stable cardiomegaly. Low volumes. Progressive bibasilar
atelectasis/infiltrates. Calcified nodule right upper lung
consistent with granuloma. Small bilateral pleural effusions.
IMPRESSION: 1. Right IJ sheath in stable position. Interim removal of Swan-Ganz
catheter, NG tube, left chest tube. No pneumothorax.

2.  Prior CABG.  Stable cardiomegaly.

3. Low lung volumes. Progressive bibasilar atelectasis/infiltrates.
Small bilateral pleural effusions.

## 2020-07-03 DIAGNOSIS — F900 Attention-deficit hyperactivity disorder, predominantly inattentive type: Secondary | ICD-10-CM | POA: Diagnosis not present

## 2020-07-03 DIAGNOSIS — F341 Dysthymic disorder: Secondary | ICD-10-CM | POA: Diagnosis not present

## 2020-07-04 IMAGING — CR DG CHEST 2V
2 series · 2 of 2 positions shown · non-contrast
Comparison: 03/11/2018 chest x-ray.

CLINICAL DATA: 67-year-old male post CABG.  Subsequent encounter.

EXAM:
CHEST - 2 VIEW

[chest pa]
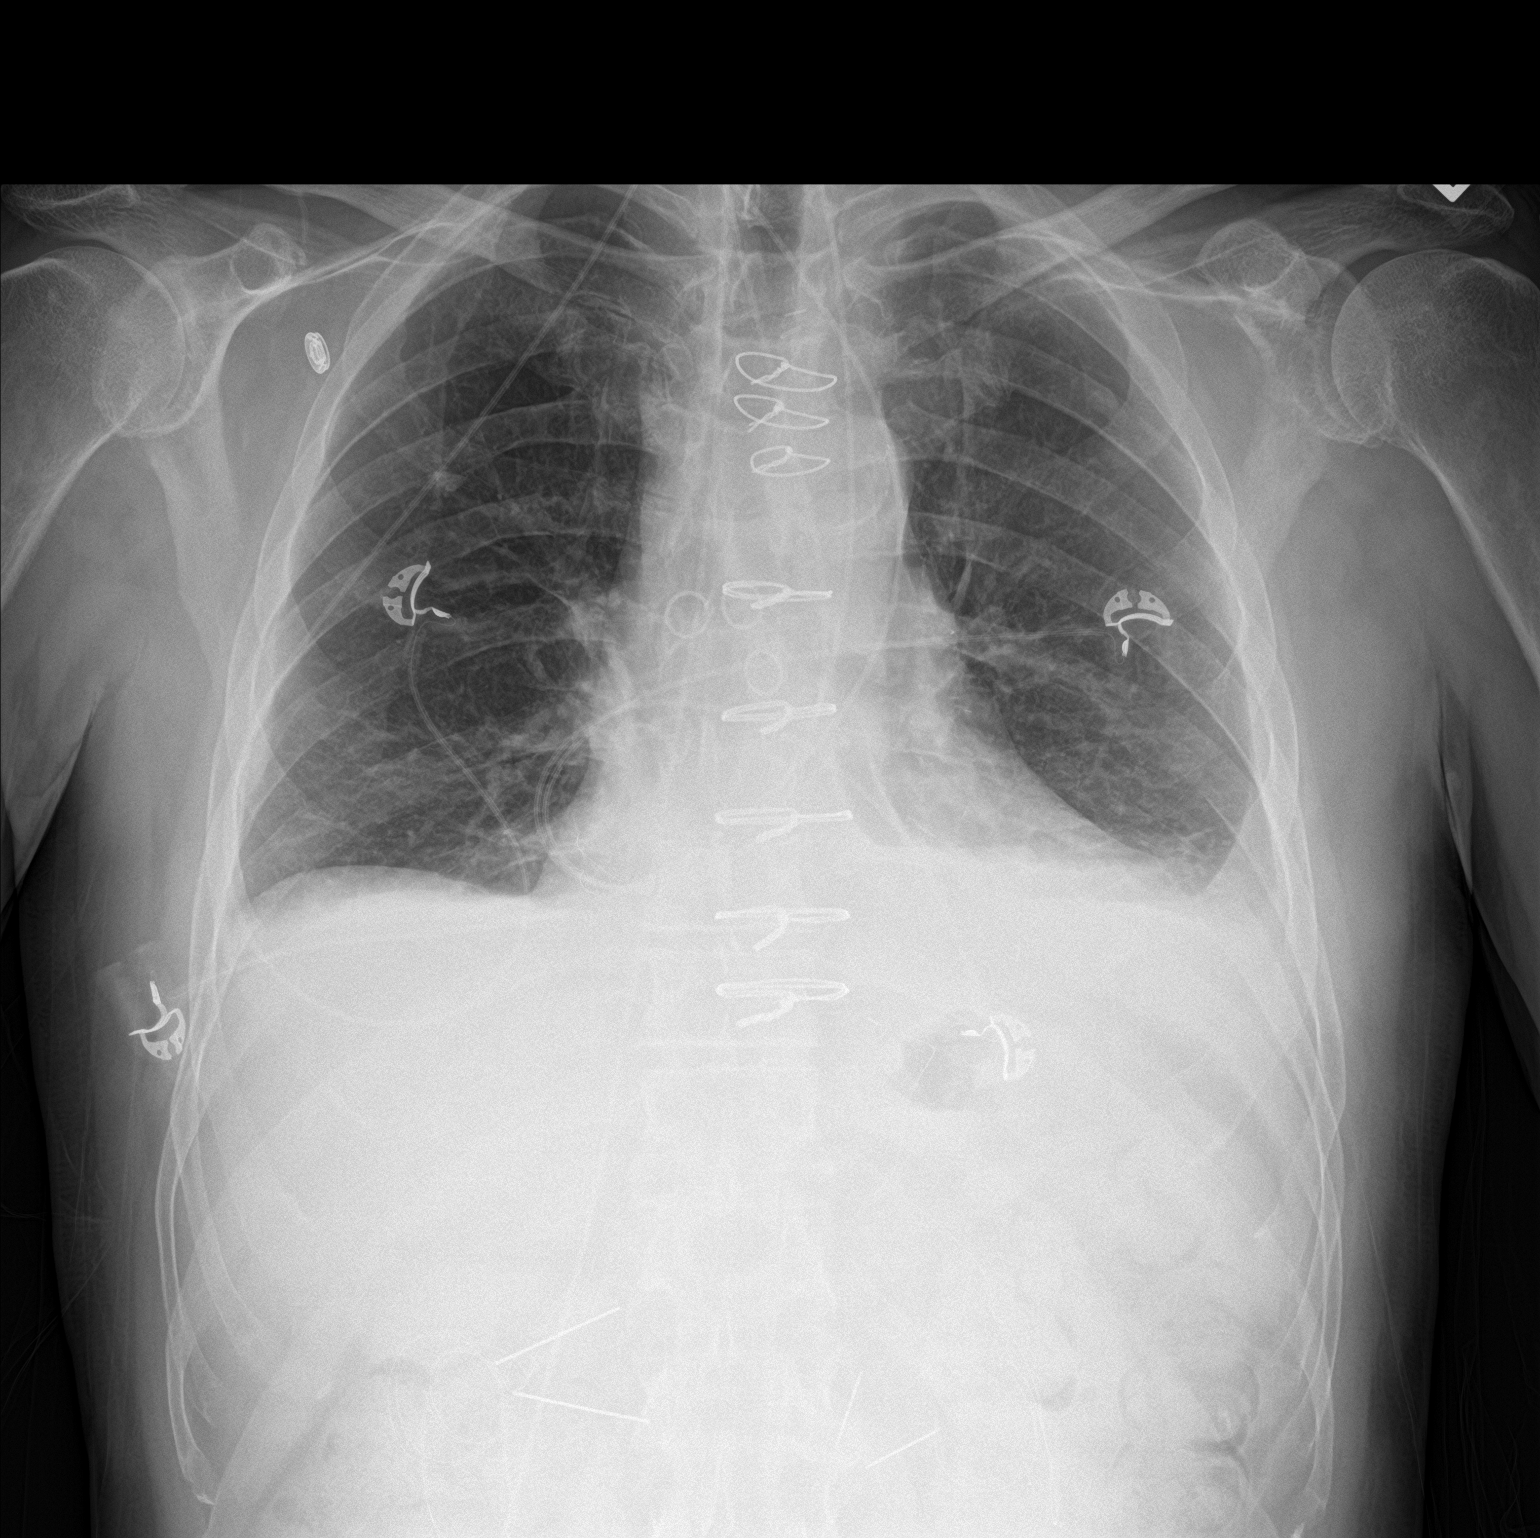

[chest lat]
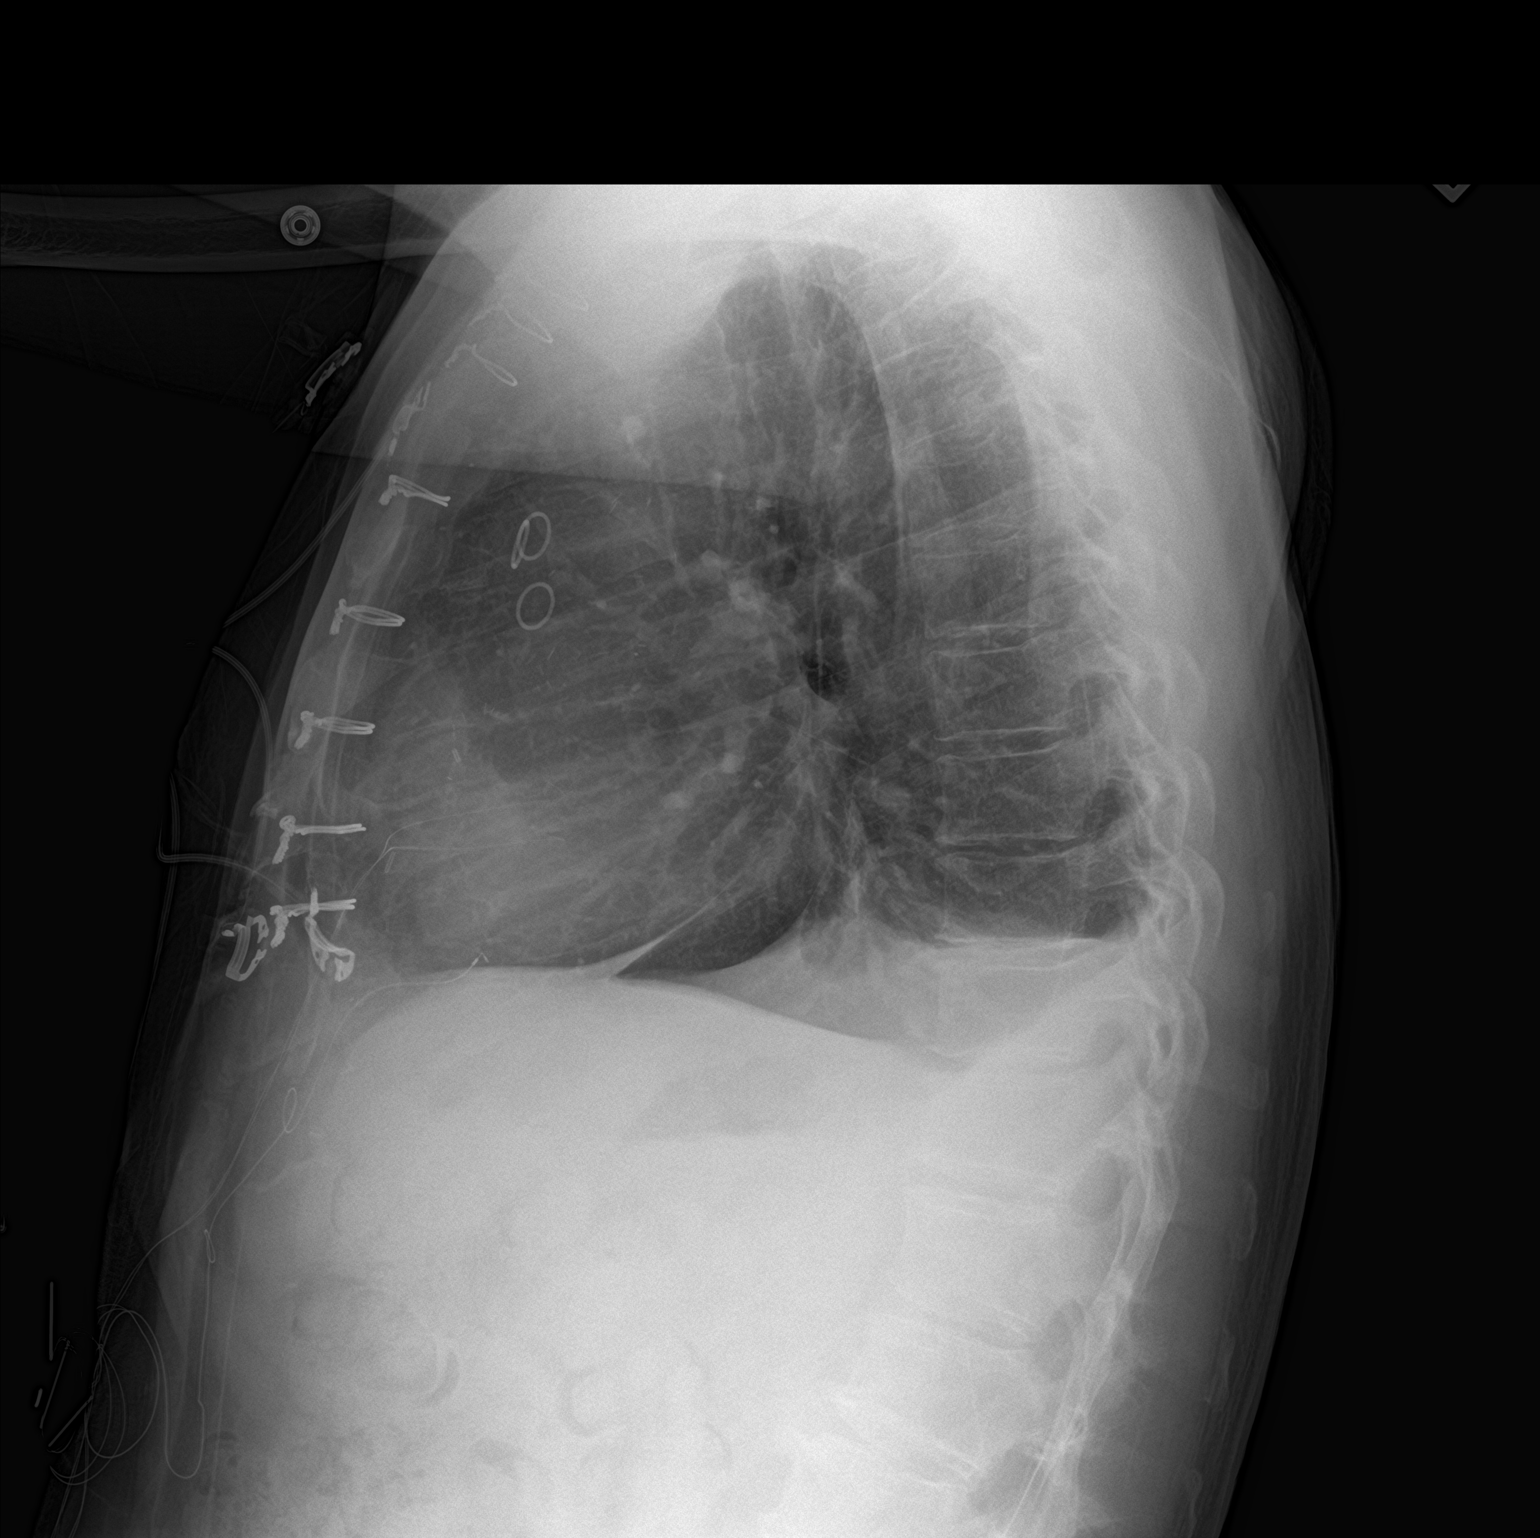

[2 of 2 positions shown; findings below may reference images not displayed]

FINDINGS: Right central line has been removed.

Post CABG.  Mild cardiomegaly.

Decrease in size of small bilateral pleural effusions larger on
left.

Bibasilar subsegmental atelectasis.

No pulmonary edema or pneumothorax.

Epicardial leads remain in place.

Calcified aorta.

No acute osseous abnormality.
IMPRESSION: Right central line has been removed.

Decrease in size of small bilateral pleural effusions (larger on the
left).

Bibasilar subsegmental atelectasis.

Aortic Atherosclerosis ([id number]).

## 2020-07-07 DIAGNOSIS — F341 Dysthymic disorder: Secondary | ICD-10-CM | POA: Diagnosis not present

## 2020-07-10 DIAGNOSIS — F341 Dysthymic disorder: Secondary | ICD-10-CM | POA: Diagnosis not present

## 2020-07-14 DIAGNOSIS — F341 Dysthymic disorder: Secondary | ICD-10-CM | POA: Diagnosis not present

## 2020-07-17 DIAGNOSIS — F341 Dysthymic disorder: Secondary | ICD-10-CM | POA: Diagnosis not present

## 2020-07-18 LAB — CUP PACEART REMOTE DEVICE CHECK
Date Time Interrogation Session: 20211111152944
Implantable Pulse Generator Implant Date: 20210803

## 2020-07-21 ENCOUNTER — Ambulatory Visit (INDEPENDENT_AMBULATORY_CARE_PROVIDER_SITE_OTHER): Payer: Medicare PPO

## 2020-07-21 DIAGNOSIS — G459 Transient cerebral ischemic attack, unspecified: Secondary | ICD-10-CM | POA: Diagnosis not present

## 2020-07-22 ENCOUNTER — Ambulatory Visit: Payer: Medicare PPO | Admitting: Neurology

## 2020-07-22 NOTE — Progress Notes (Signed)
Carelink Summary Report / Loop Recorder 

## 2020-07-28 DIAGNOSIS — F341 Dysthymic disorder: Secondary | ICD-10-CM | POA: Diagnosis not present

## 2020-07-30 DIAGNOSIS — F341 Dysthymic disorder: Secondary | ICD-10-CM | POA: Diagnosis not present

## 2020-08-02 IMAGING — DX DG CHEST 2V
2 series · 2 of 2 positions shown · non-contrast
Comparison: 03/13/2018

CLINICAL DATA: Status post coronary bypass grafting

EXAM:
CHEST - 2 VIEW

[dg chest 2 view (1 of 2)]
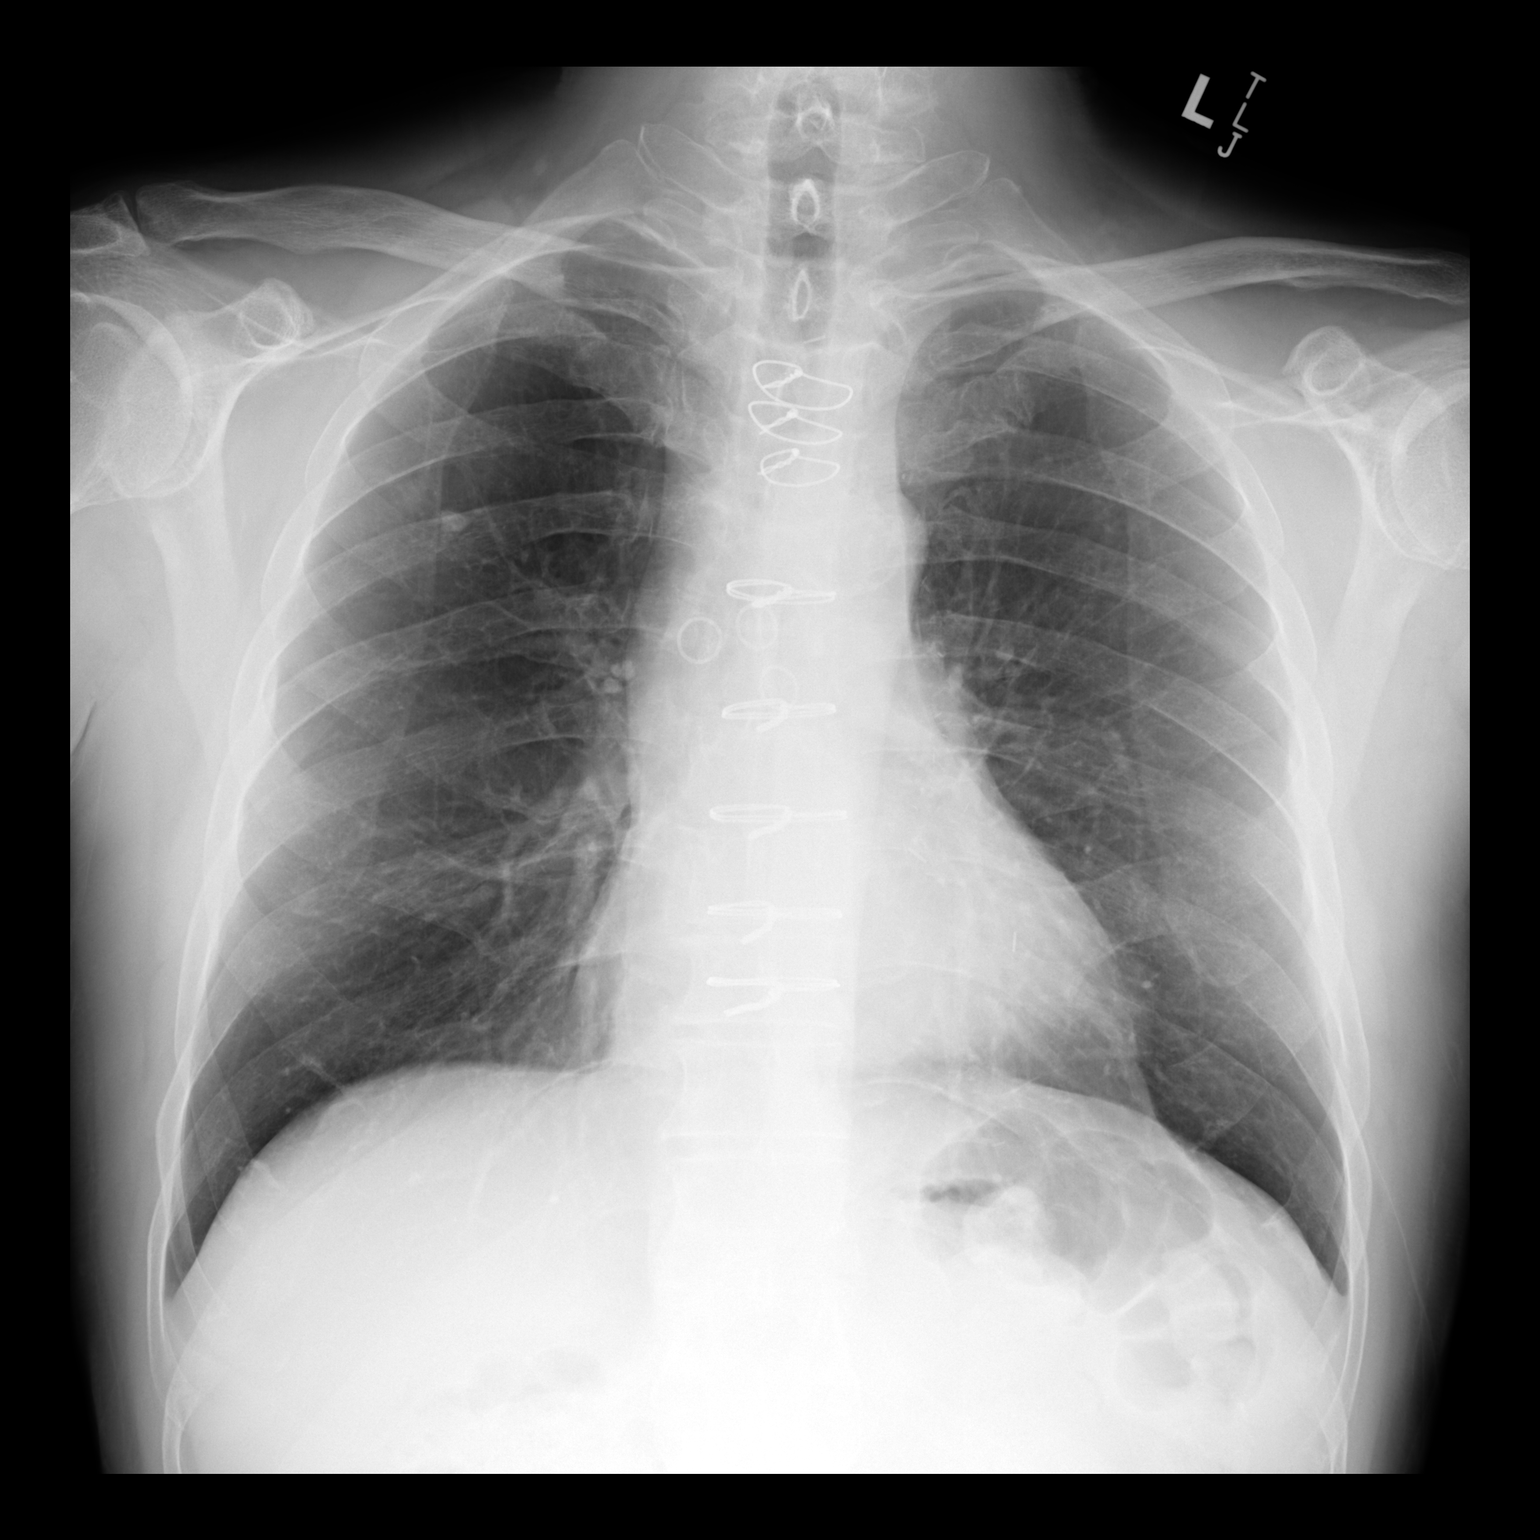

[dg chest 2 view (2 of 2)]
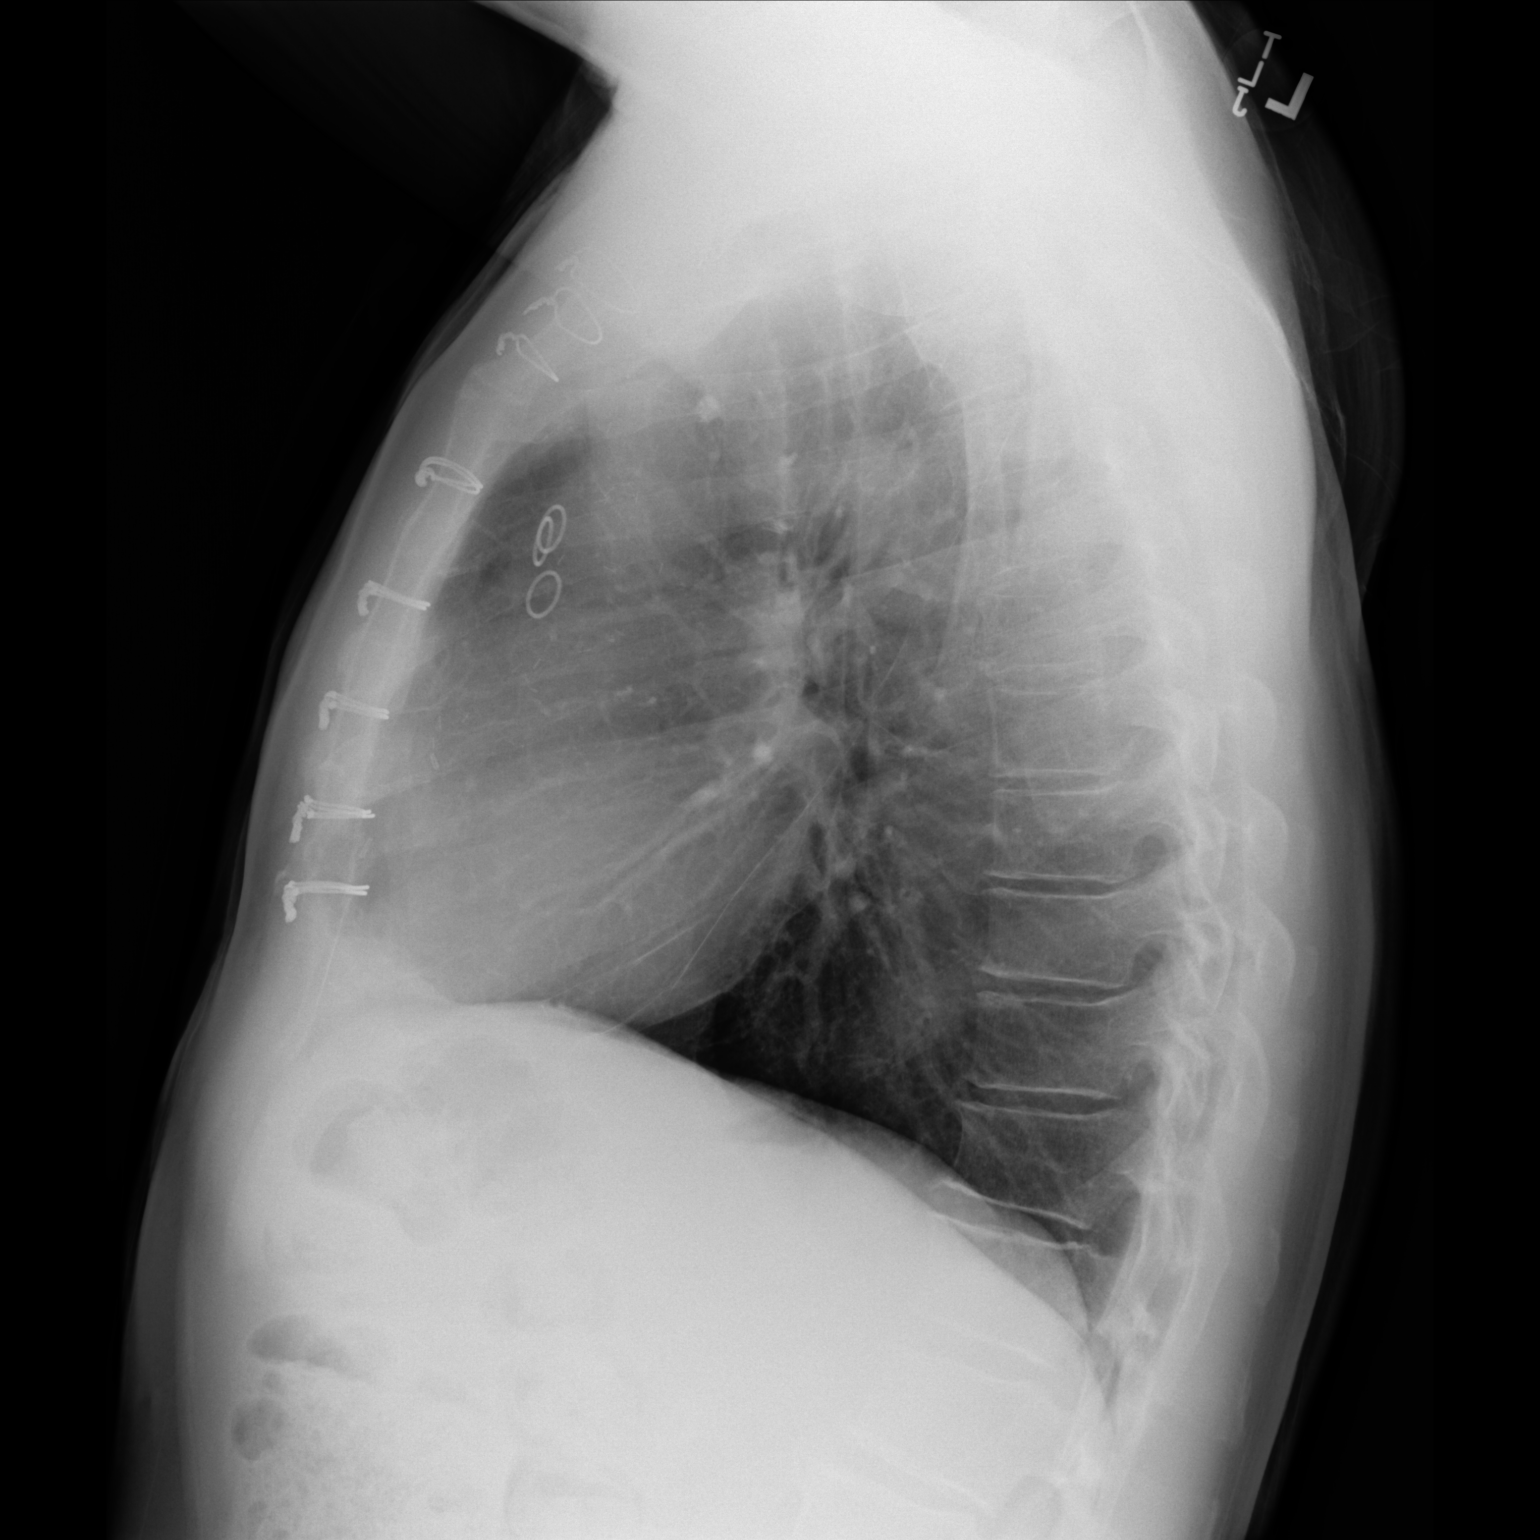

[2 of 2 positions shown; findings below may reference images not displayed]

FINDINGS: Cardiac shadow is stable. Postsurgical changes are again seen. The
lungs are well aerated bilaterally. Calcified granuloma is again
noted in the upper right lung. No bony abnormality is seen.
IMPRESSION: No acute abnormality noted.

## 2020-08-04 DIAGNOSIS — F341 Dysthymic disorder: Secondary | ICD-10-CM | POA: Diagnosis not present

## 2020-08-05 DIAGNOSIS — F341 Dysthymic disorder: Secondary | ICD-10-CM | POA: Diagnosis not present

## 2020-08-07 DIAGNOSIS — F341 Dysthymic disorder: Secondary | ICD-10-CM | POA: Diagnosis not present

## 2020-08-11 DIAGNOSIS — F341 Dysthymic disorder: Secondary | ICD-10-CM | POA: Diagnosis not present

## 2020-08-14 DIAGNOSIS — F341 Dysthymic disorder: Secondary | ICD-10-CM | POA: Diagnosis not present

## 2020-08-18 DIAGNOSIS — F341 Dysthymic disorder: Secondary | ICD-10-CM | POA: Diagnosis not present

## 2020-08-20 ENCOUNTER — Other Ambulatory Visit: Payer: Self-pay | Admitting: Neurology

## 2020-08-20 LAB — CUP PACEART REMOTE DEVICE CHECK
Date Time Interrogation Session: 20211214152545
Implantable Pulse Generator Implant Date: 20210803

## 2020-08-21 DIAGNOSIS — F341 Dysthymic disorder: Secondary | ICD-10-CM | POA: Diagnosis not present

## 2020-08-24 ENCOUNTER — Other Ambulatory Visit: Payer: Self-pay | Admitting: Cardiology

## 2020-08-25 ENCOUNTER — Ambulatory Visit (INDEPENDENT_AMBULATORY_CARE_PROVIDER_SITE_OTHER): Payer: Medicare PPO

## 2020-08-25 DIAGNOSIS — F341 Dysthymic disorder: Secondary | ICD-10-CM | POA: Diagnosis not present

## 2020-08-25 DIAGNOSIS — G459 Transient cerebral ischemic attack, unspecified: Secondary | ICD-10-CM

## 2020-08-25 NOTE — Telephone Encounter (Signed)
Rx refill sent to pharmacy. 

## 2020-08-28 DIAGNOSIS — F341 Dysthymic disorder: Secondary | ICD-10-CM | POA: Diagnosis not present

## 2020-09-01 ENCOUNTER — Ambulatory Visit: Payer: Medicare PPO | Admitting: Cardiology

## 2020-09-01 ENCOUNTER — Encounter: Payer: Self-pay | Admitting: Cardiology

## 2020-09-01 ENCOUNTER — Other Ambulatory Visit: Payer: Self-pay

## 2020-09-01 VITALS — BP 126/56 | HR 52 | Ht 70.0 in | Wt 170.0 lb

## 2020-09-01 DIAGNOSIS — Z860100 Personal history of colon polyps, unspecified: Secondary | ICD-10-CM

## 2020-09-01 DIAGNOSIS — I6523 Occlusion and stenosis of bilateral carotid arteries: Secondary | ICD-10-CM | POA: Diagnosis not present

## 2020-09-01 DIAGNOSIS — Z951 Presence of aortocoronary bypass graft: Secondary | ICD-10-CM | POA: Diagnosis not present

## 2020-09-01 DIAGNOSIS — E785 Hyperlipidemia, unspecified: Secondary | ICD-10-CM | POA: Diagnosis not present

## 2020-09-01 DIAGNOSIS — I480 Paroxysmal atrial fibrillation: Secondary | ICD-10-CM | POA: Diagnosis not present

## 2020-09-01 DIAGNOSIS — G459 Transient cerebral ischemic attack, unspecified: Secondary | ICD-10-CM

## 2020-09-01 DIAGNOSIS — R251 Tremor, unspecified: Secondary | ICD-10-CM | POA: Insufficient documentation

## 2020-09-01 DIAGNOSIS — F419 Anxiety disorder, unspecified: Secondary | ICD-10-CM | POA: Insufficient documentation

## 2020-09-01 HISTORY — DX: Personal history of colon polyps, unspecified: Z86.0100

## 2020-09-01 HISTORY — DX: Occlusion and stenosis of bilateral carotid arteries: I65.23

## 2020-09-01 NOTE — Patient Instructions (Signed)
Medication Instructions:  Your physician recommends that you continue on your current medications as directed. Please refer to the Current Medication list given to you today.  *If you need a refill on your cardiac medications before your next appointment, please call your pharmacy*   Lab Work: Your physician recommends that you return for lab work in: TODAY Lipids If you have labs (blood work) drawn today and your tests are completely normal, you will receive your results only by: [name] MyChart Message (if you have MyChart) OR . A paper copy in the mail If you have any lab test that is abnormal or we need to change your treatment, we will call you to review the results.   Testing/Procedures: Your physician has requested that you have an ankle brachial index (ABI). During this test an ultrasound and blood pressure cuff are used to evaluate the arteries that supply the arms and legs with blood. Allow thirty minutes for this exam. There are no restrictions or special instructions.    Follow-Up: At [facility], you and your health needs are our priority.  As part of our continuing mission to provide you with exceptional heart care, we have created designated Provider Care Teams.  These Care Teams include your primary Cardiologist (physician) and Advanced Practice Providers (APPs -  Physician Assistants and Nurse Practitioners) who all work together to provide you with the care you need, when you need it.  We recommend signing up for the patient portal called "MyChart".  Sign up information is provided on this After Visit Summary.  MyChart is used to connect with patients for Virtual Visits (Telemedicine).  Patients are able to view lab/test results, encounter notes, upcoming appointments, etc.  Non-urgent messages can be sent to your provider as well.   To learn more about what you can do with MyChart, go to [email or web address].    Your next appointment:   6 month(s)  The format for your  next appointment:   In Person  Provider:   [name], MD   Other Instructions

## 2020-09-01 NOTE — Addendum Note (Signed)
Addended by: Delorse Limber I on: 09/01/2020 01:36 PM   Modules accepted: Orders

## 2020-09-01 NOTE — Progress Notes (Addendum)
Cardiology Office Note:    Date:  09/01/2020   ID:  Zachary Ponce, DOB [birth year], MRN [id number]  PCP:  [name], MD  Cardiologist:  [name], MD    Referring MD: [name], MD   Chief Complaint  Patient presents with  . Follow-up  Am doing fine  History of Present Illness:    Zachary Ponce is a 69 y.o. male with past medical history significant for coronary artery disease, status post coronary to bypass graft only 2019, carotic arterial disease with up to 79% stenosis on the right side, also subclavian artery stenosis not critical, narrowing of the vertebral arteries.  He does have implantable loop recorder because of episode of TIA and basically looking for evidence of atrial fibrillation.  Comes today 2 months of follow-up he is doing well described to have some fatigue tiredness also some pain in his leg symptoms and he walks.  No palpitations no dizziness no passing out.  Past Medical History:  Diagnosis Date  . ADHD (attention deficit hyperactivity disorder) 03/03/2018  . 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 Bilateral carotid artery disease (HCC) 03/07/2018   Left CEA 03/08/18 at time of CABG   . CAD (coronary artery disease) 03/03/2018   Cardiac cath  03/03/18 70% distal left main, 99% proximal LAD, 40-50% proximal circumflex, 60% intermediate, 90% ostial right coronary artery, 55% LVEF with some mild distal anterior hypokinesis  . Depression 06/30/2016  . Dyslipidemia 07/24/2015   The 10-year ASCVD risk score Denman George DC Montez Hageman., et al., 2013) is: 15.7%   Values used to calculate the score:     Age: 44 years     Sex: Male     Is Non-Hispanic African American: No     Diabetic: No     Tobacco smoker: No     Systolic Blood Pressure: 114 mmHg     Is BP treated: No     HDL Cholesterol: 32 mg/dL     Total Cholesterol: 209 mg/dL The 31-SHFW ASCVD risk score [name], et al., 2013) is  . Essential tremor   . Generalized anxiety disorder 01/22/2016  . History of  traumatic head injury   . Hyperlipidemia 03/03/2018  . Hypogonadism in male 06/20/2018  . Hypotension after procedure 06/22/2018  . Memory loss 06/30/2016  . On amiodarone therapy 06/22/2018  . PAF (paroxysmal atrial fibrillation) (HCC) 06/22/2018  . Post concussion syndrome 06/30/2016  . Post-concussion headache 06/30/2016  . 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 TIA (transient ischemic attack) 01/18/2020    Past Surgical History:  Procedure Laterality Date  . ANKLE SURGERY    . CORONARY ARTERY BYPASS GRAFT N/A 03/08/2018   Procedure: CORONARY ARTERY BYPASS GRAFTING (CABG) X4, RIGHT SAPHENOUS VEIN HARVEST. LIMA TO LAD, SVG TO OM, SVG TO RAMUS, SVG TO PD.;  Surgeon: [name], MD;  Location: [facility] OR;  Service: Open Heart Surgery;  Laterality: N/A;  . ENDARTERECTOMY Left 03/08/2018   Procedure: ENDARTERECTOMY CAROTID;  Surgeon: [name], MD;  Location: [facility] OR;  Service: Vascular;  Laterality: Left;  . KNEE SURGERY    . LEFT HEART CATH AND CORONARY ANGIOGRAPHY N/A 03/03/2018   Procedure: LEFT HEART CATH AND CORONARY ANGIOGRAPHY;  Surgeon: [name], MD;  Location: [facility] INVASIVE CV LAB;  Service: Cardiovascular;  Laterality: N/A;  . TEE WITHOUT CARDIOVERSION N/A 03/08/2018   Procedure: TRANSESOPHAGEAL ECHOCARDIOGRAM (TEE);  Surgeon: [name], [name], MD;  Location: [facility] OR;  Service: Open Heart Surgery;  Laterality:  N/A;  . TONSILLECTOMY      Current Medications: Current Meds  Medication Sig  . atorvastatin (LIPITOR) 20 MG tablet TAKE 1 TABLET BY MOUTH EVERY DAY AT 6PM  . buPROPion (WELLBUTRIN XL) 300 MG 24 hr tablet Take 300 mg by mouth daily.  . clopidogrel (PLAVIX) 75 MG tablet TAKE 1 TABLET BY MOUTH EVERY DAY  . escitalopram (LEXAPRO) 5 MG tablet Take 5 mg by mouth daily.  . NON FORMULARY Apply 50 mg topically daily. Testosterone 50mg /ml  . propranolol ER (INDERAL LA) 80 MG 24 hr capsule Take 1 capsule (80 mg total) by mouth daily.  . [DISCONTINUED] acetaminophen (TYLENOL) 325 MG  tablet Take 325 mg by mouth daily.  . [DISCONTINUED] amphetamine-dextroamphetamine (ADDERALL XR) 30 MG 24 hr capsule Take 30 mg by mouth daily.  . [DISCONTINUED] aspirin EC 81 MG tablet Take 81 mg by mouth daily.  . [DISCONTINUED] lisdexamfetamine (VYVANSE) 30 MG capsule Take 30 mg by mouth daily.     Allergies:   Patient has no known allergies.   Social History   Socioeconomic History  . Marital status: Married    Spouse name: Not on file  . Number of children: Not on file  . Years of education: Not on file  . Highest education level: Not on file  Occupational History  . Not on file  Tobacco Use  . Smoking status: Never Smoker  . Smokeless tobacco: Never Used  Vaping Use  . Vaping Use: Never used  Substance and Sexual Activity  . Alcohol use: Not Currently  . Drug use: Never  . Sexual activity: Yes  Other Topics Concern  . Not on file  Social History Narrative   Left handed   3 story home   Drink decaffaine   Social Determinants of Health   Financial Resource Strain: Not on file  Food Insecurity: Not on file  Transportation Needs: Not on file  Physical Activity: Not on file  Stress: Not on file  Social Connections: Not on file     Family History: The patient's family history includes Diabetes in his brother; Lung cancer in his mother. ROS:   Please see the history of present illness.    All 14 point review of systems negative except as described per history of present illness  EKGs/Labs/Other Studies Reviewed:      Recent Labs: No results found for requested labs within last 8760 hours.  Recent Lipid Panel    Component Value Date/Time   CHOL 106 08/21/2019 1503   TRIG 102 08/21/2019 1503   HDL 38 (L) 08/21/2019 1503   CHOLHDL 2.8 08/21/2019 1503   LDLCALC 49 08/21/2019 1503    Physical Exam:    VS:  BP (!) 126/56 (BP Location: Left Arm, Patient Position: Sitting)   Pulse (!) 52   Ht 5\' 10"  (1.778 m)   Wt 170 lb (77.1 kg)   SpO2 92%   BMI 24.39  kg/m     Wt Readings from Last 3 Encounters:  09/01/20 170 lb (77.1 kg)  06/26/20 166 lb 14.4 oz (75.7 kg)  05/22/20 166 lb (75.3 kg)     GEN:  Well nourished, well developed in no acute distress HEENT: Normal NECK: No JVD; right carotid bruit LYMPHATICS: No lymphadenopathy CARDIAC: RRR, no murmurs, no rubs, no gallops RESPIRATORY:  Clear to auscultation without rales, wheezing or rhonchi  ABDOMEN: Soft, non-tender, non-distended MUSCULOSKELETAL:  No edema; No deformity  SKIN: Warm and dry LOWER EXTREMITIES: no swelling NEUROLOGIC:  Alert  and oriented x 3 PSYCHIATRIC:  Normal affect   ASSESSMENT:    1. S/P CABG x 4   2. Bilateral carotid artery stenosis   3. TIA (transient ischemic attack)   4. PAF (paroxysmal atrial fibrillation) (HCC)   5. Dyslipidemia    PLAN:    In order of problems listed above:  1. Status post coronary bypass grafting 2019 doing well, asymptomatic on appropriate medications which I will continue.  We did talk about risk factors modifications. 2. Bilateral carotic arterial stenosis with up to 79% stenosis of the left side, asymptomatic.  Denies have any recent TIA.  We will continue risk factors modification which include antiplatelets therapy as well as statin. 3. Dyslipidemia: We will schedule him to have fasting lipid profile done. 4. Implantable loop recorder present I did review interrogation which showed no evidence of atrial fibrillation. 5. We did talk about healthy lifestyle need to exercise on the regular basis which he understand. 6. He described to have pain in lower extremities while walking.  Concern about claudication I did palpate pulses there is a weak pulse present.  We will do ABIs and segmental pressures   Medication Adjustments/Labs and Tests Ordered: Current medicines are reviewed at length with the patient today.  Concerns regarding medicines are outlined above.  No orders of the defined types were placed in this  encounter.  Medication changes: No orders of the defined types were placed in this encounter.   Signed, Georgeanna Lea, MD, [facility] 09/01/2020 1:29 PM    [facility]

## 2020-09-02 DIAGNOSIS — Z951 Presence of aortocoronary bypass graft: Secondary | ICD-10-CM | POA: Diagnosis not present

## 2020-09-02 DIAGNOSIS — I6523 Occlusion and stenosis of bilateral carotid arteries: Secondary | ICD-10-CM | POA: Diagnosis not present

## 2020-09-02 DIAGNOSIS — I480 Paroxysmal atrial fibrillation: Secondary | ICD-10-CM | POA: Diagnosis not present

## 2020-09-02 DIAGNOSIS — E785 Hyperlipidemia, unspecified: Secondary | ICD-10-CM | POA: Diagnosis not present

## 2020-09-02 DIAGNOSIS — G459 Transient cerebral ischemic attack, unspecified: Secondary | ICD-10-CM | POA: Diagnosis not present

## 2020-09-03 ENCOUNTER — Telehealth: Payer: Self-pay

## 2020-09-03 LAB — LIPID PANEL
Chol/HDL Ratio: 3.1 ratio (ref 0.0–5.0)
Cholesterol, Total: 116 mg/dL (ref 100–199)
HDL: 37 mg/dL — ABNORMAL LOW (ref >39)
LDL Chol Calc (NIH): 60 mg/dL (ref 0–99)
Triglycerides: 104 mg/dL (ref 0–149)
VLDL Cholesterol Cal: 19 mg/dL (ref 5–40)

## 2020-09-03 NOTE — Telephone Encounter (Signed)
-----   Message from Georgeanna Lea, MD sent at 09/03/2020  8:24 AM EST ----- Cholesterol good, continue present management

## 2020-09-03 NOTE — Telephone Encounter (Signed)
Patient notified of results and verbalized understanding.  

## 2020-09-08 DIAGNOSIS — F341 Dysthymic disorder: Secondary | ICD-10-CM | POA: Diagnosis not present

## 2020-09-08 NOTE — Progress Notes (Signed)
Carelink Summary Report / Loop Recorder 

## 2020-09-11 DIAGNOSIS — F341 Dysthymic disorder: Secondary | ICD-10-CM | POA: Diagnosis not present

## 2020-09-15 ENCOUNTER — Other Ambulatory Visit: Payer: Self-pay | Admitting: Neurology

## 2020-09-15 DIAGNOSIS — F341 Dysthymic disorder: Secondary | ICD-10-CM | POA: Diagnosis not present

## 2020-09-15 NOTE — Progress Notes (Signed)
Virtual Visit via Video Note The purpose of this virtual visit is to provide medical care while limiting exposure to the novel coronavirus.    Consent was obtained for video visit:  Yes. Answered questions that patient had about telehealth interaction:  Yes.   I discussed the limitations, risks, security and privacy concerns of performing an evaluation and management service by telemedicine. I also discussed with the patient that there may be a patient responsible charge related to this service. The patient expressed understanding and agreed to proceed.  Pt location: Home Physician Location: office Name of referring provider:  [name], MD I connected with Zachary Ponce at patients initiation/request on 09/17/2020 at 10:30 AM EST by video enabled telemedicine application and verified that I am speaking with the correct person using two identifiers. Pt MRN:  [id number] Pt DOB:  03/23/1951 Video Participants:  Zachary Ponce   History of Present Illness:  Zachary Ponce is a 70 year old ambidextrous Caucasian male with CAD s/p coronary artery bypass grafting, left carotid artery disease s/p CEA, HLD, depression and anxiety, essential tremor and history of traumatic brain injury who follows up for TIA.  UPDATE: Current medications:  Plavix 75mg  daily, atorvastatin 20mg  daily, propranolol ER 80mg  daily  CTA of head and neck from 03/17/2020 personally reviewed showed moderate M1 MCA stenoses bilaterally and 65-70% proximal right ICA stenosis and moderate stenosis at origin of the dominant right vertebral artery.  2D echocardiogram on 03/20/2020 showed EF 60-65% with no cardiac source of embolus.  He is with implantable loop recorder.  No atrial fibrillation has yet been identified.  LDL from 09/02/2020 was 60.    His legs feel a little tired, noticeable if walking up a hill.  No pain.  He is scheduled for ultrasound.    Propranolol was decreased from 120mg  to 80mg  after CEA to  prevent hypotension/hypoperfusion.  Tremor has been getting worse.  Difficult to use utensils, type or operate the cellphone.    HISTORY: In late April, he had an episode of dizziness, nausea, vomiting.  He was laying on the bed with his right arm laying over the side and couldn't move it for about 5 minutes.  EMS was called and blood pressure was reportedly 160 systolic and EKG reportedly normal.    Carotid ultrasound on 01/16/2020 showed 60-79% right ICA stenosis and 1-39% left ICA stenosis.  MRI of brain without contrast on 01/18/2020 personally reviewed showed mild chronic small vessel ischemic changes and small remote left frontal infact but no acute intracranial abnormality.  2 week cardiac event monitor earlier this month showed no a fib.  He and cardiology are considering implantable loop recorder.    He was on ASA 81mg  and Lipitor 20mg  daily prior to event.  No change was made in management.  In 2019, he underwent left CEA for carotid artery stenosis and coronary bypass grafting.  Following procedure, he did have episodes of atrial fibrillation and was on Eliquis for a period of time.      Past Medical History: Past Medical History:  Diagnosis Date  . ADHD (attention deficit hyperactivity disorder) 03/03/2018  . 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 Bilateral carotid artery disease (HCC) 03/07/2018   Left CEA 03/08/18 at time of CABG   . CAD (coronary artery disease) 03/03/2018   Cardiac cath  03/03/18 70% distal left main, 99% proximal LAD, 40-50% proximal circumflex, 60% intermediate, 90% ostial right coronary artery, 55% LVEF with some mild distal anterior hypokinesis  .  Depression 06/30/2016  . Dyslipidemia 07/24/2015   The 10-year ASCVD risk score Denman George DC Montez Hageman., et al., 2013) is: 15.7%   Values used to calculate the score:     Age: 65 years     Sex: Male     Is Non-Hispanic African American: No     Diabetic: No     Tobacco smoker: No     Systolic Blood Pressure: 114 mmHg     Is BP  treated: No     HDL Cholesterol: 32 mg/dL     Total Cholesterol: 209 mg/dL The 71-GGYI ASCVD risk score [name], et al., 2013) is  . Essential tremor   . Generalized anxiety disorder 01/22/2016  . History of traumatic head injury   . Hyperlipidemia 03/03/2018  . Hypogonadism in male 06/20/2018  . Hypotension after procedure 06/22/2018  . Memory loss 06/30/2016  . On amiodarone therapy 06/22/2018  . PAF (paroxysmal atrial fibrillation) (HCC) 06/22/2018  . Post concussion syndrome 06/30/2016  . Post-concussion headache 06/30/2016  . 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 TIA (transient ischemic attack) 01/18/2020    Medications: Outpatient Encounter Medications as of 09/17/2020  Medication Sig  . primidone (MYSOLINE) 50 MG tablet Take 1/2 tablet at bedtime for one week, then increase to 1 tablet at bedtime  . atorvastatin (LIPITOR) 20 MG tablet TAKE 1 TABLET BY MOUTH EVERY DAY AT 6PM  . buPROPion (WELLBUTRIN XL) 300 MG 24 hr tablet Take 300 mg by mouth daily.  . clopidogrel (PLAVIX) 75 MG tablet TAKE 1 TABLET BY MOUTH EVERY DAY  . escitalopram (LEXAPRO) 5 MG tablet Take 5 mg by mouth daily.  . NON FORMULARY Apply 50 mg topically daily. Testosterone 50mg /ml  . propranolol ER (INDERAL LA) 80 MG 24 hr capsule Take 1 capsule (80 mg total) by mouth daily.   Facility-Administered Encounter Medications as of 09/17/2020  Medication  . sodium chloride flush (NS) 0.9 % injection 10 mL    Allergies: No Known Allergies  Family History: Family History  Problem Relation Age of Onset  . Lung cancer Mother   . Diabetes Brother     Social History: Social History   Socioeconomic History  . Marital status: Married    Spouse name: Not on file  . Number of children: Not on file  . Years of education: Not on file  . Highest education level: Not on file  Occupational History  . Not on file  Tobacco Use  . Smoking status: Never Smoker  . Smokeless tobacco: Never Used  Vaping Use  . Vaping Use: Never  used  Substance and Sexual Activity  . Alcohol use: Not Currently  . Drug use: Never  . Sexual activity: Yes  Other Topics Concern  . Not on file  Social History Narrative   Left handed   3 story home   Drink decaffaine   Social Determinants of Health   Financial Resource Strain: Not on file  Food Insecurity: Not on file  Transportation Needs: Not on file  Physical Activity: Not on file  Stress: Not on file  Social Connections: Not on file  Intimate Partner Violence: Not on file    Observations/Objective:   There were no vitals taken for this visit. No acute distress.  Alert and oriented.  Speech fluent and not dysarthric.  Language intact.    Assessment and Plan:   1.  Transient ischemic attack 2.  Essential tremor - worse since decreasing propranolol. 3.  Bilateral carotid artery stenosis  4.  Dyslipidemia  1.  For tremor, start primidone 25mg  at bedtime for one week, then increase to 50mg  at bedtime.  We can increase dose in 3 weeks if needed.  Continue propranolol for now. 2.  Secondary stroke prevention: -  Plavix 75mg  daily -  Statin therapy.  LDL goal less than 70 -  Glycemic control.  Hgb A1c goal less than 7 -  Blood pressure control 3.  Carotid stenosis monitored by vascular surgery 4.  Follow up 6 months.  Follow Up Instructions:    -I discussed the assessment and treatment plan with the patient. The patient was provided an opportunity to ask questions and all were answered. The patient agreed with the plan and demonstrated an understanding of the instructions.   The patient was advised to call back or seek an in-person evaluation if the symptoms worsen or if the condition fails to improve as anticipated.    , DO

## 2020-09-17 ENCOUNTER — Telehealth (INDEPENDENT_AMBULATORY_CARE_PROVIDER_SITE_OTHER): Payer: Medicare PPO | Admitting: Neurology

## 2020-09-17 ENCOUNTER — Other Ambulatory Visit: Payer: Self-pay

## 2020-09-17 ENCOUNTER — Encounter: Payer: Self-pay | Admitting: Neurology

## 2020-09-17 DIAGNOSIS — G250 Essential tremor: Secondary | ICD-10-CM | POA: Diagnosis not present

## 2020-09-17 DIAGNOSIS — I6523 Occlusion and stenosis of bilateral carotid arteries: Secondary | ICD-10-CM | POA: Diagnosis not present

## 2020-09-17 DIAGNOSIS — E785 Hyperlipidemia, unspecified: Secondary | ICD-10-CM | POA: Diagnosis not present

## 2020-09-17 DIAGNOSIS — G459 Transient cerebral ischemic attack, unspecified: Secondary | ICD-10-CM | POA: Diagnosis not present

## 2020-09-17 MED ORDER — PRIMIDONE 50 MG PO TABS: ORAL_TABLET | ORAL | 0 refills | Status: DC

## 2020-09-18 ENCOUNTER — Ambulatory Visit: Payer: Medicare PPO | Admitting: Cardiology

## 2020-09-18 DIAGNOSIS — F341 Dysthymic disorder: Secondary | ICD-10-CM | POA: Diagnosis not present

## 2020-09-22 ENCOUNTER — Ambulatory Visit (INDEPENDENT_AMBULATORY_CARE_PROVIDER_SITE_OTHER): Payer: Medicare PPO

## 2020-09-22 DIAGNOSIS — F341 Dysthymic disorder: Secondary | ICD-10-CM | POA: Diagnosis not present

## 2020-09-22 DIAGNOSIS — G459 Transient cerebral ischemic attack, unspecified: Secondary | ICD-10-CM | POA: Diagnosis not present

## 2020-09-24 ENCOUNTER — Ambulatory Visit (HOSPITAL_BASED_OUTPATIENT_CLINIC_OR_DEPARTMENT_OTHER)
Admission: RE | Admit: 2020-09-24 | Discharge: 2020-09-24 | Disposition: A | Discharge status: Home / Self Care | Payer: Medicare PPO | Source: Ambulatory Visit | Attending: Cardiology | Admitting: Cardiology

## 2020-09-24 ENCOUNTER — Other Ambulatory Visit: Payer: Self-pay

## 2020-09-24 DIAGNOSIS — I480 Paroxysmal atrial fibrillation: Secondary | ICD-10-CM | POA: Diagnosis not present

## 2020-09-24 DIAGNOSIS — G459 Transient cerebral ischemic attack, unspecified: Secondary | ICD-10-CM | POA: Insufficient documentation

## 2020-09-24 DIAGNOSIS — E785 Hyperlipidemia, unspecified: Secondary | ICD-10-CM | POA: Diagnosis not present

## 2020-09-24 DIAGNOSIS — I6523 Occlusion and stenosis of bilateral carotid arteries: Secondary | ICD-10-CM | POA: Diagnosis not present

## 2020-09-24 DIAGNOSIS — Z951 Presence of aortocoronary bypass graft: Secondary | ICD-10-CM | POA: Diagnosis not present

## 2020-09-24 DIAGNOSIS — I2510 Atherosclerotic heart disease of native coronary artery without angina pectoris: Secondary | ICD-10-CM

## 2020-09-24 LAB — CUP PACEART REMOTE DEVICE CHECK
Date Time Interrogation Session: 20220116152754
Implantable Pulse Generator Implant Date: 20210803

## 2020-09-25 DIAGNOSIS — F341 Dysthymic disorder: Secondary | ICD-10-CM | POA: Diagnosis not present

## 2020-09-29 DIAGNOSIS — F909 Attention-deficit hyperactivity disorder, unspecified type: Secondary | ICD-10-CM | POA: Diagnosis not present

## 2020-09-29 DIAGNOSIS — F341 Dysthymic disorder: Secondary | ICD-10-CM | POA: Diagnosis not present

## 2020-10-02 DIAGNOSIS — F341 Dysthymic disorder: Secondary | ICD-10-CM | POA: Diagnosis not present

## 2020-10-06 DIAGNOSIS — F341 Dysthymic disorder: Secondary | ICD-10-CM | POA: Diagnosis not present

## 2020-10-07 NOTE — Progress Notes (Signed)
Carelink Summary Report / Loop Recorder 

## 2020-10-09 ENCOUNTER — Other Ambulatory Visit: Payer: Self-pay | Admitting: Neurology

## 2020-10-09 DIAGNOSIS — F341 Dysthymic disorder: Secondary | ICD-10-CM | POA: Diagnosis not present

## 2020-10-13 DIAGNOSIS — F341 Dysthymic disorder: Secondary | ICD-10-CM | POA: Diagnosis not present

## 2020-10-16 DIAGNOSIS — F341 Dysthymic disorder: Secondary | ICD-10-CM | POA: Diagnosis not present

## 2020-10-20 DIAGNOSIS — F341 Dysthymic disorder: Secondary | ICD-10-CM | POA: Diagnosis not present

## 2020-10-23 ENCOUNTER — Ambulatory Visit (INDEPENDENT_AMBULATORY_CARE_PROVIDER_SITE_OTHER): Payer: Medicare PPO

## 2020-10-23 DIAGNOSIS — G459 Transient cerebral ischemic attack, unspecified: Secondary | ICD-10-CM

## 2020-10-23 DIAGNOSIS — F341 Dysthymic disorder: Secondary | ICD-10-CM | POA: Diagnosis not present

## 2020-10-25 LAB — CUP PACEART REMOTE DEVICE CHECK
Date Time Interrogation Session: 20220218153043
Implantable Pulse Generator Implant Date: 20210803

## 2020-10-28 DIAGNOSIS — F341 Dysthymic disorder: Secondary | ICD-10-CM | POA: Diagnosis not present

## 2020-10-29 NOTE — Progress Notes (Signed)
Carelink Summary Report / Loop Recorder 

## 2020-10-30 DIAGNOSIS — F341 Dysthymic disorder: Secondary | ICD-10-CM | POA: Diagnosis not present

## 2020-11-03 DIAGNOSIS — F341 Dysthymic disorder: Secondary | ICD-10-CM | POA: Diagnosis not present

## 2020-11-06 DIAGNOSIS — F341 Dysthymic disorder: Secondary | ICD-10-CM | POA: Diagnosis not present

## 2020-11-10 DIAGNOSIS — F341 Dysthymic disorder: Secondary | ICD-10-CM | POA: Diagnosis not present

## 2020-11-13 DIAGNOSIS — F341 Dysthymic disorder: Secondary | ICD-10-CM | POA: Diagnosis not present

## 2020-11-15 ENCOUNTER — Other Ambulatory Visit: Payer: Self-pay | Admitting: Neurology

## 2020-11-17 DIAGNOSIS — F341 Dysthymic disorder: Secondary | ICD-10-CM | POA: Diagnosis not present

## 2020-11-20 DIAGNOSIS — F341 Dysthymic disorder: Secondary | ICD-10-CM | POA: Diagnosis not present

## 2020-11-24 ENCOUNTER — Ambulatory Visit: Payer: Medicare PPO

## 2020-11-24 DIAGNOSIS — G459 Transient cerebral ischemic attack, unspecified: Secondary | ICD-10-CM

## 2020-11-24 DIAGNOSIS — F341 Dysthymic disorder: Secondary | ICD-10-CM | POA: Diagnosis not present

## 2020-11-27 DIAGNOSIS — F341 Dysthymic disorder: Secondary | ICD-10-CM | POA: Diagnosis not present

## 2020-11-27 LAB — CUP PACEART REMOTE DEVICE CHECK
Date Time Interrogation Session: 20220323152514
Implantable Pulse Generator Implant Date: 20210803

## 2020-12-01 DIAGNOSIS — F341 Dysthymic disorder: Secondary | ICD-10-CM | POA: Diagnosis not present

## 2020-12-02 NOTE — Progress Notes (Signed)
Carelink Summary Report / Loop Recorder 

## 2020-12-03 DIAGNOSIS — H2513 Age-related nuclear cataract, bilateral: Secondary | ICD-10-CM | POA: Diagnosis not present

## 2020-12-04 DIAGNOSIS — F341 Dysthymic disorder: Secondary | ICD-10-CM | POA: Diagnosis not present

## 2020-12-08 DIAGNOSIS — F341 Dysthymic disorder: Secondary | ICD-10-CM | POA: Diagnosis not present

## 2020-12-11 DIAGNOSIS — F341 Dysthymic disorder: Secondary | ICD-10-CM | POA: Diagnosis not present

## 2020-12-15 DIAGNOSIS — F341 Dysthymic disorder: Secondary | ICD-10-CM | POA: Diagnosis not present

## 2020-12-18 DIAGNOSIS — F341 Dysthymic disorder: Secondary | ICD-10-CM | POA: Diagnosis not present

## 2020-12-22 DIAGNOSIS — F341 Dysthymic disorder: Secondary | ICD-10-CM | POA: Diagnosis not present

## 2020-12-24 ENCOUNTER — Telehealth: Payer: Self-pay | Admitting: Cardiology

## 2020-12-24 NOTE — Telephone Encounter (Signed)
   Patient Name: Zachary Ponce  DOB: Feb 26, 1951  MRN: [id number]   Primary Cardiologist: [name], MD  Chart reviewed as part of pre-operative protocol coverage.   Simple dental extractions are considered low risk procedures per guidelines and generally do not require any specific cardiac clearance. It is also generally accepted that for simple extractions and dental cleanings, there is no need to interrupt blood thinner therapy.  SBE prophylaxis is not required for the patient from a cardiac standpoint.  I will route this recommendation to the requesting party via Epic fax function and remove from pre-op pool.  Please call with questions.  [place], [place] 12/24/2020, 12:27 PM

## 2020-12-24 NOTE — Telephone Encounter (Signed)
   [facility] Pre-operative Risk Assessment    Request for surgical clearance:  1. What type of surgery is being performed? Extraction #15 upper left  2. When is this surgery scheduled? TBD  3. What type of clearance is required (medical clearance vs. Pharmacy clearance to hold med vs. Both)? Med hold  4. Are there any medications that need to be held prior to surgery and how long? Arbie Cookey wants to know if pt needs to stop the Plavix for an extraction  Practice name and name of physician performing surgery? Irene Shipper DDS What is your office phone number [phone]  7.   What is your office fax number [phone]  8.   Anesthesia type (None, local, MAC, general) ? Local  Arbie Cookey needs to know if pt needs to be off Plavix before his tooth extraction.   Larina Bras 12/24/2020, 11:51 AM  _________________________________________________________________   (provider comments below)

## 2020-12-25 ENCOUNTER — Ambulatory Visit (INDEPENDENT_AMBULATORY_CARE_PROVIDER_SITE_OTHER): Payer: Medicare PPO

## 2020-12-25 DIAGNOSIS — G459 Transient cerebral ischemic attack, unspecified: Secondary | ICD-10-CM | POA: Diagnosis not present

## 2020-12-25 DIAGNOSIS — F341 Dysthymic disorder: Secondary | ICD-10-CM | POA: Diagnosis not present

## 2020-12-29 DIAGNOSIS — F341 Dysthymic disorder: Secondary | ICD-10-CM | POA: Diagnosis not present

## 2020-12-30 LAB — CUP PACEART REMOTE DEVICE CHECK
Date Time Interrogation Session: 20220425152824
Implantable Pulse Generator Implant Date: 20210803

## 2021-01-01 DIAGNOSIS — F341 Dysthymic disorder: Secondary | ICD-10-CM | POA: Diagnosis not present

## 2021-01-05 DIAGNOSIS — F341 Dysthymic disorder: Secondary | ICD-10-CM | POA: Diagnosis not present

## 2021-01-08 DIAGNOSIS — F341 Dysthymic disorder: Secondary | ICD-10-CM | POA: Diagnosis not present

## 2021-01-12 DIAGNOSIS — F341 Dysthymic disorder: Secondary | ICD-10-CM | POA: Diagnosis not present

## 2021-01-13 DIAGNOSIS — F900 Attention-deficit hyperactivity disorder, predominantly inattentive type: Secondary | ICD-10-CM | POA: Diagnosis not present

## 2021-01-13 NOTE — Progress Notes (Signed)
Carelink Summary Report / Loop Recorder 

## 2021-01-15 ENCOUNTER — Ambulatory Visit: Payer: Medicare PPO | Admitting: Neurology

## 2021-01-15 DIAGNOSIS — F341 Dysthymic disorder: Secondary | ICD-10-CM | POA: Diagnosis not present

## 2021-01-17 ENCOUNTER — Other Ambulatory Visit: Payer: Self-pay | Admitting: Cardiology

## 2021-01-19 DIAGNOSIS — F341 Dysthymic disorder: Secondary | ICD-10-CM | POA: Diagnosis not present

## 2021-01-19 NOTE — Telephone Encounter (Signed)
Refill sent to pharmacy.   

## 2021-01-22 DIAGNOSIS — F341 Dysthymic disorder: Secondary | ICD-10-CM | POA: Diagnosis not present

## 2021-01-26 DIAGNOSIS — F341 Dysthymic disorder: Secondary | ICD-10-CM | POA: Diagnosis not present

## 2021-01-28 ENCOUNTER — Other Ambulatory Visit: Payer: Self-pay

## 2021-01-28 ENCOUNTER — Ambulatory Visit: Payer: Medicare PPO | Admitting: Physician Assistant

## 2021-01-28 ENCOUNTER — Encounter: Payer: Self-pay | Admitting: Physician Assistant

## 2021-01-28 ENCOUNTER — Ambulatory Visit (HOSPITAL_COMMUNITY)
Admission: RE | Admit: 2021-01-28 | Discharge: 2021-01-28 | Disposition: A | Discharge status: Home / Self Care | Payer: Medicare PPO | Source: Ambulatory Visit | Attending: Vascular Surgery | Admitting: Vascular Surgery

## 2021-01-28 VITALS — BP 117/63 | HR 56 | Temp 98.7°F | Resp 20 | Ht 70.0 in | Wt 155.2 lb

## 2021-01-28 DIAGNOSIS — I771 Stricture of artery: Secondary | ICD-10-CM

## 2021-01-28 DIAGNOSIS — I6523 Occlusion and stenosis of bilateral carotid arteries: Secondary | ICD-10-CM

## 2021-01-28 NOTE — Progress Notes (Signed)
History of Present Illness:  Patient is a 70 y.o. year old male who presents for evaluation of carotid stenosis.  He has hx of combination CABG and left CEA with Dr. Alla German and Dr. Darrick Penna in July 2019.   Preoperative duplex demonstrated bilateral critical carotid stenosis 80 to 99% by duplex. Postoperatively right carotid duplex demonstrated only 60 to 79% by duplex.  The patient denies symptoms of TIA, amaurosis, or stroke.  He continues to have dizziness with positional changes that has been present since prior to the surgery and he has known vertebral artery stenosis.   The pt is on a statin for cholesterol management.  The pt is not on a daily aspirin.   Other AC:  Plavix The pt is on BB for hypertension.   The pt is not diabetic.   Tobacco hx:  never  Past Medical History:  Diagnosis Date  . ADHD (attention deficit hyperactivity disorder) 03/03/2018  . 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 Bilateral carotid artery disease (HCC) 03/07/2018   Left CEA 03/08/18 at time of CABG   . CAD (coronary artery disease) 03/03/2018   Cardiac cath  03/03/18 70% distal left main, 99% proximal LAD, 40-50% proximal circumflex, 60% intermediate, 90% ostial right coronary artery, 55% LVEF with some mild distal anterior hypokinesis  . Depression 06/30/2016  . Dyslipidemia 07/24/2015   The 10-year ASCVD risk score Denman George DC Montez Hageman., et al., 2013) is: 15.7%   Values used to calculate the score:     Age: 74 years     Sex: Male     Is Non-Hispanic African American: No     Diabetic: No     Tobacco smoker: No     Systolic Blood Pressure: 114 mmHg     Is BP treated: No     HDL Cholesterol: 32 mg/dL     Total Cholesterol: 209 mg/dL The [id number] ASCVD risk score [name], et al., 2013) is  . Essential tremor   . Generalized anxiety disorder 01/22/2016  . History of traumatic head injury   . Hyperlipidemia 03/03/2018  . Hypogonadism in male 06/20/2018  . Hypotension after procedure 06/22/2018  . Memory loss  06/30/2016  . On amiodarone therapy 06/22/2018  . PAF (paroxysmal atrial fibrillation) (HCC) 06/22/2018  . Post concussion syndrome 06/30/2016  . Post-concussion headache 06/30/2016  . 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 TIA (transient ischemic attack) 01/18/2020    Past Surgical History:  Procedure Laterality Date  . ANKLE SURGERY    . CORONARY ARTERY BYPASS GRAFT N/A 03/08/2018   Procedure: CORONARY ARTERY BYPASS GRAFTING (CABG) X4, RIGHT SAPHENOUS VEIN HARVEST. LIMA TO LAD, SVG TO OM, SVG TO RAMUS, SVG TO PD.;  Surgeon: [name], MD;  Location: [facility] OR;  Service: Open Heart Surgery;  Laterality: N/A;  . ENDARTERECTOMY Left 03/08/2018   Procedure: ENDARTERECTOMY CAROTID;  Surgeon: [name], MD;  Location: [facility] OR;  Service: Vascular;  Laterality: Left;  . KNEE SURGERY    . LEFT HEART CATH AND CORONARY ANGIOGRAPHY N/A 03/03/2018   Procedure: LEFT HEART CATH AND CORONARY ANGIOGRAPHY;  Surgeon: [name], MD;  Location: [facility] INVASIVE CV LAB;  Service: Cardiovascular;  Laterality: N/A;  . TEE WITHOUT CARDIOVERSION N/A 03/08/2018   Procedure: TRANSESOPHAGEAL ECHOCARDIOGRAM (TEE);  Surgeon: [name], [name], MD;  Location: [facility] OR;  Service: Open Heart Surgery;  Laterality: N/A;  . TONSILLECTOMY       Social History Social History  Tobacco Use  . Smoking status: Never Smoker  . Smokeless tobacco: Never Used  Vaping Use  . Vaping Use: Never used  Substance Use Topics  . Alcohol use: Not Currently  . Drug use: Never    Family History Family History  Problem Relation Age of Onset  . Lung cancer Mother   . Diabetes Brother     Allergies  No Known Allergies   Current Outpatient Medications  Medication Sig Dispense Refill  . atorvastatin (LIPITOR) 20 MG tablet TAKE 1 TABLET BY MOUTH EVERY DAY AT 6PM 90 tablet 3  . buPROPion (WELLBUTRIN XL) 300 MG 24 hr tablet Take 300 mg by mouth daily.    . clopidogrel (PLAVIX) 75 MG tablet TAKE 1 TABLET BY MOUTH EVERY DAY 30 tablet 4  .  escitalopram (LEXAPRO) 5 MG tablet Take 5 mg by mouth daily.  0  . NON FORMULARY Apply 50 mg topically daily. Testosterone 50mg /ml  0  . primidone (MYSOLINE) 50 MG tablet 1 tablet at bedtime 30 tablet 6  . propranolol ER (INDERAL LA) 80 MG 24 hr capsule Take 1 capsule (80 mg total) by mouth daily. 90 capsule 3   No current facility-administered medications for this visit.   Facility-Administered Medications Ordered in Other Visits  Medication Dose Route Frequency Provider Last Rate Last Admin  . sodium chloride flush (NS) 0.9 % injection 10 mL  10 mL Intravenous PRN Jaffe, Adam R, DO   20 mL at 03/20/20 1125    ROS:   General:  No weight loss, Fever, chills  HEENT: No recent headaches, no nasal bleeding, no visual changes, no sore throat  Neurologic: chronic hx of dizziness, blackouts, seizures. No recent symptoms of stroke or mini- stroke. No recent episodes of slurred speech, or temporary blindness.  Cardiac: No recent episodes of chest pain/pressure, no shortness of breath at rest.  No shortness of breath with exertion.  Denies history of atrial fibrillation or irregular heartbeat  Vascular: No history of rest pain in feet.  No history of claudication.  No history of non-healing ulcer, No history of DVT   Pulmonary: No home oxygen, no productive cough, no hemoptysis,  No asthma or wheezing  Musculoskeletal:  [ ]  Arthritis, [ ]  Low back pain,  [ ]  Joint pain  Hematologic:No history of hypercoagulable state.  No history of easy bleeding.  No history of anemia  Gastrointestinal: No hematochezia or melena,  No gastroesophageal reflux, no trouble swallowing  Urinary: [ ]  chronic Kidney disease, [ ]  on HD - [ ]  MWF or [ ]  TTHS, [ ]  Burning with urination, [ ]  Frequent urination, [ ]  Difficulty urinating;   Skin: No rashes  Psychological: No history of anxiety,  No history of depression   Physical Examination  Vitals:   01/28/21 1336 01/28/21 1338  BP: 109/63 117/63  Pulse:  (!) 56   Resp: 20   Temp: 98.7 F (37.1 C)   TempSrc: Temporal   SpO2: 99%   Weight: 155 lb 3.2 oz (70.4 kg)   Height: 5\' 10"  (1.778 m)     Body mass index is 22.27 kg/m.  General:  Alert and oriented, no acute distress HEENT: Normal Neck: No bruit or JVD Pulmonary: Clear to auscultation bilaterally Cardiac: Regular Rate and Rhythm without murmur Gastrointestinal: Soft, non-tender, non-distended, no mass, no scars Skin: No rash Extremity Pulses:  2+ radial, brachial, femoral, dorsalis pedis, posterior tibial pulses bilaterally Musculoskeletal: No deformity or edema  Neurologic: Upper and lower extremity motor  5/5 and symmetric  DATA:    Right Carotid Findings:  +----------+--------+--------+--------+------------------+--------+       PSV cm/sEDV cm/sStenosisPlaque DescriptionComments  +----------+--------+--------+--------+------------------+--------+  CCA Prox 103   11                      +----------+--------+--------+--------+------------------+--------+  CCA Distal110   21                      +----------+--------+--------+--------+------------------+--------+  ICA Prox 292   83   60-79%                +----------+--------+--------+--------+------------------+--------+  ICA Mid  205   60                      +----------+--------+--------+--------+------------------+--------+  ICA Distal144   34                      +----------+--------+--------+--------+------------------+--------+  ECA    135   18                      +----------+--------+--------+--------+------------------+--------+   +----------+--------+-------+--------+-------------------+       PSV cm/sEDV cmsDescribeArm Pressure (mmHG)  +----------+--------+-------+--------+-------------------+  [id number]        Stenotic            +----------+--------+-------+--------+-------------------+   +---------+--------+--+--------+-+---------+  VertebralPSV cm/s59EDV cm/s8Antegrade  +---------+--------+--+--------+-+---------+      Left Carotid Findings:  +----------+--------+--------+--------+------------------+--------+       PSV cm/sEDV cm/sStenosisPlaque DescriptionComments  +----------+--------+--------+--------+------------------+--------+  CCA Prox 120   27                      +----------+--------+--------+--------+------------------+--------+  CCA Distal138   27                      +----------+--------+--------+--------+------------------+--------+  ICA Prox 61   14   1-39%                 +----------+--------+--------+--------+------------------+--------+  ICA Mid  74   20                      +----------+--------+--------+--------+------------------+--------+  ICA Distal97   33                      +----------+--------+--------+--------+------------------+--------+  ECA    116   18                      +----------+--------+--------+--------+------------------+--------+   +----------+--------+--------+--------+-------------------+       PSV cm/sEDV cm/sDescribeArm Pressure (mmHG)  +----------+--------+--------+--------+-------------------+  [id number]       Stenotic            +----------+--------+--------+--------+-------------------+   +---------+--------+---+--------+--+---------+  VertebralPSV cm/s118EDV cm/s33Antegrade  +---------+--------+---+--------+--+---------+      ASSESSMENT:  S/Pcombination CABG and left CEA with Dr. Alla GermanVanTright and Dr. Darrick PennaFields in July 2019.   He has had stenosis of 60-79% on the right so we  are keeping a close eye on him.  He denise recent events of stroke or TIA since he was seen 6 months ago.  He has subclavian stenosis.  His BP on the right UE 117/63 and the left 109/63.  Stable disposition.   Right Carotid: Velocities in the right ICA are consistent with a 60-79%         stenosis.   Left Carotid: Velocities in the left ICA are consistent with a 1-39%  stenosis.    PLAN: Continue activity, good nutrition to improve weight goal and exercise.  He has lost over 30 lbs since his surgery.  He will f/u in 6 months for repeat duplex.     Mosetta Pigeon PA-C [facility] Office: [phone]

## 2021-01-29 DIAGNOSIS — F341 Dysthymic disorder: Secondary | ICD-10-CM | POA: Diagnosis not present

## 2021-01-30 ENCOUNTER — Other Ambulatory Visit: Payer: Self-pay

## 2021-01-30 DIAGNOSIS — I6523 Occlusion and stenosis of bilateral carotid arteries: Secondary | ICD-10-CM

## 2021-02-02 DIAGNOSIS — F341 Dysthymic disorder: Secondary | ICD-10-CM | POA: Diagnosis not present

## 2021-02-03 ENCOUNTER — Other Ambulatory Visit: Payer: Self-pay | Admitting: Neurology

## 2021-02-03 ENCOUNTER — Other Ambulatory Visit: Payer: Self-pay | Admitting: Cardiology

## 2021-02-05 DIAGNOSIS — F341 Dysthymic disorder: Secondary | ICD-10-CM | POA: Diagnosis not present

## 2021-02-09 DIAGNOSIS — F341 Dysthymic disorder: Secondary | ICD-10-CM | POA: Diagnosis not present

## 2021-02-09 DIAGNOSIS — H43812 Vitreous degeneration, left eye: Secondary | ICD-10-CM | POA: Diagnosis not present

## 2021-02-12 DIAGNOSIS — F341 Dysthymic disorder: Secondary | ICD-10-CM | POA: Diagnosis not present

## 2021-02-16 DIAGNOSIS — F341 Dysthymic disorder: Secondary | ICD-10-CM | POA: Diagnosis not present

## 2021-02-19 DIAGNOSIS — F341 Dysthymic disorder: Secondary | ICD-10-CM | POA: Diagnosis not present

## 2021-02-23 DIAGNOSIS — F341 Dysthymic disorder: Secondary | ICD-10-CM | POA: Diagnosis not present

## 2021-02-25 DIAGNOSIS — F341 Dysthymic disorder: Secondary | ICD-10-CM | POA: Diagnosis not present

## 2021-03-01 ENCOUNTER — Other Ambulatory Visit: Payer: Self-pay | Admitting: Neurology

## 2021-03-02 DIAGNOSIS — R059 Cough, unspecified: Secondary | ICD-10-CM | POA: Diagnosis not present

## 2021-03-02 DIAGNOSIS — R519 Headache, unspecified: Secondary | ICD-10-CM | POA: Diagnosis not present

## 2021-03-02 DIAGNOSIS — F341 Dysthymic disorder: Secondary | ICD-10-CM | POA: Diagnosis not present

## 2021-03-02 DIAGNOSIS — R5383 Other fatigue: Secondary | ICD-10-CM | POA: Diagnosis not present

## 2021-03-02 DIAGNOSIS — U071 COVID-19: Secondary | ICD-10-CM | POA: Diagnosis not present

## 2021-03-05 DIAGNOSIS — F341 Dysthymic disorder: Secondary | ICD-10-CM | POA: Diagnosis not present

## 2021-03-05 LAB — CUP PACEART REMOTE DEVICE CHECK
Date Time Interrogation Session: 20220630152824
Implantable Pulse Generator Implant Date: 20210803

## 2021-03-06 ENCOUNTER — Ambulatory Visit (INDEPENDENT_AMBULATORY_CARE_PROVIDER_SITE_OTHER): Payer: Medicare PPO

## 2021-03-06 DIAGNOSIS — G459 Transient cerebral ischemic attack, unspecified: Secondary | ICD-10-CM | POA: Diagnosis not present

## 2021-03-09 DIAGNOSIS — F341 Dysthymic disorder: Secondary | ICD-10-CM | POA: Diagnosis not present

## 2021-03-12 DIAGNOSIS — F341 Dysthymic disorder: Secondary | ICD-10-CM | POA: Diagnosis not present

## 2021-03-13 ENCOUNTER — Other Ambulatory Visit: Payer: Self-pay

## 2021-03-16 DIAGNOSIS — F341 Dysthymic disorder: Secondary | ICD-10-CM | POA: Diagnosis not present

## 2021-03-19 DIAGNOSIS — F341 Dysthymic disorder: Secondary | ICD-10-CM | POA: Diagnosis not present

## 2021-03-23 ENCOUNTER — Encounter: Payer: Self-pay | Admitting: Cardiology

## 2021-03-23 ENCOUNTER — Other Ambulatory Visit: Payer: Self-pay

## 2021-03-23 ENCOUNTER — Ambulatory Visit: Payer: Medicare PPO | Admitting: Cardiology

## 2021-03-23 ENCOUNTER — Telehealth: Payer: Self-pay | Admitting: Cardiology

## 2021-03-23 VITALS — BP 102/62 | HR 60 | Ht 70.0 in | Wt 155.0 lb

## 2021-03-23 DIAGNOSIS — I2510 Atherosclerotic heart disease of native coronary artery without angina pectoris: Secondary | ICD-10-CM

## 2021-03-23 DIAGNOSIS — G459 Transient cerebral ischemic attack, unspecified: Secondary | ICD-10-CM

## 2021-03-23 DIAGNOSIS — F341 Dysthymic disorder: Secondary | ICD-10-CM | POA: Diagnosis not present

## 2021-03-23 DIAGNOSIS — I480 Paroxysmal atrial fibrillation: Secondary | ICD-10-CM

## 2021-03-23 DIAGNOSIS — I6523 Occlusion and stenosis of bilateral carotid arteries: Secondary | ICD-10-CM

## 2021-03-23 NOTE — Telephone Encounter (Signed)
Spoke to the patient just now and let him know that it is recommended that he takes this medication in the evening with his evening meal. He verbalizes understanding and thanks me for the call back.

## 2021-03-23 NOTE — Patient Instructions (Signed)

## 2021-03-23 NOTE — Progress Notes (Signed)
NEUROLOGY FOLLOW UP OFFICE NOTE  Zachary Ponce [id number]  Assessment/Plan:   Transient ischemic attack  Essential tremor  Right carotid artery stenosis  Dyslipidemia  1.For tremor:  He will retry primidone - 25mg  in evening (earlier than bedtime) for one week, then increase to 50mg .  If still with nightmares, we can try taking it in morning.  If his loop recorder detects a fib and he needs to be changed to anticoagulation, I told him that we would have to stop primidone.  Another option is topiramate (discussed side effects) 2.Secondary stroke prevention as managed by PCP: Plavix 75mg  daily -  Statin therapy.  LDL goal less than 70 -  Glycemic control.  Hgb A1c goal less than 7 -  Normotensive blood pressure 3. Carotid stenosis monitored by vascular surgery 4. A fib monitoring with loop recorder 5. Follow up 6 months.  Subjective:  Zachary Ponce is a 70 year old ambidextrous Caucasian male with CAD s/p coronary artery bypass grafting, left carotid artery disease s/p CEA, HLD, depression and anxiety, essential tremor and history of traumatic brain injury who follows up for TIA.   UPDATE: Current medications:  Plavix 75mg  daily, atorvastatin 20mg  daily, propranolol ER 80mg  daily (higher doses caused hypotension), primidone 50mg  QHS   He is with implantable loop recorder.  No atrial fibrillation has yet been identified.  His cardiologist is concerned about the propranolol due to bradycardia (it was 52 at the office).  His average HR is reportedly 60.  He does report a little more lightheaded and fatigue.    In addition to propranolol, he was started on primidone in January to control tremor.  He stopped after 2 days because it caused him to have nightmares    Carotid ultrasound on 01/28/2021 showed right ICA 60-79% stenosis, no hemodynamically significant stenosis of left ICA    HISTORY:  In late April 2021, he had an episode of dizziness, nausea, vomiting.  He was laying  on the bed with his right arm laying over the side and couldn't move it for about 5 minutes.  EMS was called and blood pressure was reportedly 160 systolic and EKG reportedly normal.     Carotid ultrasound on 01/16/2020 showed 60-79% right ICA stenosis and 1-39% left ICA stenosis.  MRI of brain without contrast on 01/18/2020 personally reviewed showed mild chronic small vessel ischemic changes and small remote left frontal infact but no acute intracranial abnormality.    CTA of head and neck from 03/17/2020 personally reviewed showed moderate M1 MCA stenoses bilaterally and 65-70% proximal right ICA stenosis and moderate stenosis at origin of the dominant right vertebral artery.  2D echocardiogram on 03/20/2020 showed EF 60-65% with no cardiac source of embolus.    In 2019, he underwent left CEA for carotid artery stenosis and coronary bypass grafting.  Following procedure, he did have episodes of atrial fibrillation and was on Eliquis for a period of time.    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   Bilateral carotid artery disease (HCC) 03/07/2018   Left CEA 03/08/18 at time of CABG    CAD (coronary artery disease) 03/03/2018   Cardiac cath  03/03/18 70% distal left main, 99% proximal LAD, 40-50% proximal circumflex, 60% intermediate, 90% ostial right coronary artery, 55% LVEF with some mild distal anterior hypokinesis   Depression 06/30/2016   Dyslipidemia 07/24/2015   The 10-year ASCVD risk  score [name], et al., 2013) is: 15.7%   Values used to calculate the score:     Age: 88 years     Sex: Male     Is Non-Hispanic African American: No     Diabetic: No     Tobacco smoker: No     Systolic Blood Pressure: 114 mmHg     Is BP treated: No     HDL Cholesterol: 32 mg/dL     Total Cholesterol: 209 mg/dL The 70-JJKK ASCVD risk score [name], et al., 2013) is   Essential tremor     Generalized anxiety disorder 01/22/2016   History of traumatic head injury    Hyperlipidemia 03/03/2018   Hypogonadism in male 06/20/2018   Hypotension after procedure 06/22/2018   Memory loss 06/30/2016   On amiodarone therapy 06/22/2018   PAF (paroxysmal atrial fibrillation) (HCC) 06/22/2018   Post concussion syndrome 06/30/2016   Post-concussion headache 06/30/2016   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     clopidogrel (PLAVIX) 75 MG tablet TAKE 1 TABLET BY MOUTH EVERY DAY 30 tablet 0   escitalopram (LEXAPRO) 5 MG tablet Take 5 mg by mouth daily.  0   NON FORMULARY Apply 50 mg topically daily. Testosterone 50mg /ml  0   propranolol ER (INDERAL LA) 80 MG 24 hr capsule TAKE 1 CAPSULE BY MOUTH EVERY DAY 90 capsule 2   Current Facility-Administered Medications on File Prior to Visit  Medication Dose Route Frequency Provider Last Rate Last Admin   sodium chloride flush (NS) 0.9 % injection 10 mL  10 mL Intravenous PRN Whitnie Deleon R, DO   20 mL at 03/20/20 1125    ALLERGIES: No Known Allergies  FAMILY HISTORY: Family History  Problem Relation Age of Onset   Lung cancer Mother    Diabetes Brother       Objective:  Blood pressure (!) 100/53, pulse 61, height 5\' 10"  (1.778 m), weight 156 lb 3.2 oz (70.9 kg), SpO2 96 %. General: No acute distress.  Patient appears well-groomed.   Head:  Normocephalic/atraumatic Eyes:  Fundi examined but not visualized Neck: supple, no paraspinal tenderness, full range of motion Heart:  Regular rate and rhythm Lungs:  Clear to auscultation bilaterally Back: No paraspinal tenderness Neurological Exam: alert and oriented to person, place, and time.  Speech  fluent and not dysarthric, language intact.  CN II-XII intact. Bulk and tone normal, muscle strength 5/5 throughout.  Action tremor in both hands.  Sensation to light touch intact.  Deep tendon reflexes 2+ throughout, toes downgoing.  Finger to nose testing intact.  Gait normal, Romberg negative.   03/22/20, DO  CC: , MD

## 2021-03-23 NOTE — Telephone Encounter (Signed)
Pt c/o medication issue:  1. Name of Medication:  atorvastatin (LIPITOR) 20 MG tablet  2. How are you currently taking this medication (dosage and times per day)? 1 tablet in the morning with all other medications   3. Are you having a reaction (difficulty breathing--STAT)? No   4. What is your medication issue? Wants to know if it's okay that he takes this in the morning instead of 6 PM as advised. States he takes all his medications at one time in the morning. Please advise.

## 2021-03-25 ENCOUNTER — Encounter: Payer: Self-pay | Admitting: Neurology

## 2021-03-25 ENCOUNTER — Other Ambulatory Visit: Payer: Self-pay | Admitting: Neurology

## 2021-03-25 ENCOUNTER — Ambulatory Visit (INDEPENDENT_AMBULATORY_CARE_PROVIDER_SITE_OTHER): Payer: Medicare PPO | Admitting: Neurology

## 2021-03-25 ENCOUNTER — Other Ambulatory Visit: Payer: Self-pay

## 2021-03-25 VITALS — BP 100/53 | HR 61 | Ht 70.0 in | Wt 156.2 lb

## 2021-03-25 DIAGNOSIS — I6521 Occlusion and stenosis of right carotid artery: Secondary | ICD-10-CM

## 2021-03-25 DIAGNOSIS — E785 Hyperlipidemia, unspecified: Secondary | ICD-10-CM

## 2021-03-25 DIAGNOSIS — G459 Transient cerebral ischemic attack, unspecified: Secondary | ICD-10-CM | POA: Diagnosis not present

## 2021-03-25 DIAGNOSIS — G250 Essential tremor: Secondary | ICD-10-CM | POA: Diagnosis not present

## 2021-03-25 MED ORDER — PRIMIDONE 50 MG PO TABS: ORAL_TABLET | ORAL | 0 refills | Status: DC

## 2021-03-25 NOTE — Patient Instructions (Signed)
Stop propranolol.  Start primidone 50mg  - take 1/2 tablet in evening for one week, then increase to 1 tablet every evening.  We can increase dose if needed. Follow up 6 months.

## 2021-03-26 ENCOUNTER — Encounter: Payer: Self-pay | Admitting: Neurology

## 2021-03-26 DIAGNOSIS — F341 Dysthymic disorder: Secondary | ICD-10-CM | POA: Diagnosis not present

## 2021-03-27 NOTE — Progress Notes (Signed)
Carelink Summary Report / Loop Recorder 

## 2021-03-30 DIAGNOSIS — F341 Dysthymic disorder: Secondary | ICD-10-CM | POA: Diagnosis not present

## 2021-04-02 DIAGNOSIS — F341 Dysthymic disorder: Secondary | ICD-10-CM | POA: Diagnosis not present

## 2021-04-06 ENCOUNTER — Ambulatory Visit (INDEPENDENT_AMBULATORY_CARE_PROVIDER_SITE_OTHER): Payer: Medicare PPO

## 2021-04-06 ENCOUNTER — Telehealth: Payer: Self-pay

## 2021-04-06 ENCOUNTER — Other Ambulatory Visit: Payer: Self-pay

## 2021-04-06 DIAGNOSIS — I480 Paroxysmal atrial fibrillation: Secondary | ICD-10-CM

## 2021-04-06 DIAGNOSIS — F341 Dysthymic disorder: Secondary | ICD-10-CM | POA: Diagnosis not present

## 2021-04-06 MED ORDER — TOPIRAMATE 25 MG PO TABS: ORAL_TABLET | ORAL | 1 refills | Status: DC

## 2021-04-06 NOTE — Telephone Encounter (Signed)
Pt called and informed that per Dr Everlena Cooper we can start topiramate 25mg  tablet.  Side effect may cause numbness and tingling, so he should not worry if he experiences this.  He should stay hydrated because it rarely causes kidney stones.  If agreeable, start 25mg  at bedtime for one week, then increase to 25mg  twice daily.  We can increase dose from there if needed. Script sent into pharmacy

## 2021-04-07 LAB — CUP PACEART REMOTE DEVICE CHECK
Date Time Interrogation Session: 20220802153106
Implantable Pulse Generator Implant Date: 20210803

## 2021-04-09 DIAGNOSIS — F341 Dysthymic disorder: Secondary | ICD-10-CM | POA: Diagnosis not present

## 2021-04-13 DIAGNOSIS — F341 Dysthymic disorder: Secondary | ICD-10-CM | POA: Diagnosis not present

## 2021-04-16 DIAGNOSIS — F341 Dysthymic disorder: Secondary | ICD-10-CM | POA: Diagnosis not present

## 2021-04-17 ENCOUNTER — Telehealth: Payer: Self-pay | Admitting: Cardiology

## 2021-04-17 NOTE — Telephone Encounter (Signed)
Pt c/o BP issue: STAT if pt c/o blurred vision, one-sided weakness or slurred speech  1. What are your last 5 BP readings? 160/81 pulse 73  2. Are you having any other symptoms (ex. Dizziness, headache, blurred vision, passed out)?  Super anxious, and shaking  3. What is your BP issue? High blood pressure- he took 3 Brain Save and wonder if this might cause his blood pressure to go up

## 2021-04-17 NOTE — Telephone Encounter (Signed)
Advised pt to call [employer] as they would be able to see chemical makeup and advise what to do.

## 2021-04-20 DIAGNOSIS — F341 Dysthymic disorder: Secondary | ICD-10-CM | POA: Diagnosis not present

## 2021-04-23 DIAGNOSIS — F341 Dysthymic disorder: Secondary | ICD-10-CM | POA: Diagnosis not present

## 2021-04-27 DIAGNOSIS — F341 Dysthymic disorder: Secondary | ICD-10-CM | POA: Diagnosis not present

## 2021-04-29 NOTE — Progress Notes (Signed)
Carelink Summary Report / Loop Recorder 

## 2021-04-30 DIAGNOSIS — F341 Dysthymic disorder: Secondary | ICD-10-CM | POA: Diagnosis not present

## 2021-05-04 DIAGNOSIS — F341 Dysthymic disorder: Secondary | ICD-10-CM | POA: Diagnosis not present

## 2021-05-05 ENCOUNTER — Telehealth: Payer: Self-pay | Admitting: Neurology

## 2021-05-05 NOTE — Telephone Encounter (Signed)
Advised pt, At his last visit with Dr.Jaffe, Per AVS instructions.  2.Secondary stroke prevention as managed by PCP: Plavix 75mg  daily -  Statin therapy.  LDL goal less than 70 -  Glycemic control.  Hgb A1c goal less than 7 -  Normotensive blood pressure.  Per pt he do not remember that conversation. He will call his PCP to have them refill script.

## 2021-05-05 NOTE — Telephone Encounter (Signed)
Pt called in and left a message with the access nurse. He is out of his blood thinner.

## 2021-05-05 NOTE — Telephone Encounter (Signed)
Pt is calling regarding his plavix. He stated he has called a few times and has not had anyone return his call. The medication needs to be sent to [facility] [place]  [address] [place] [phone] # [phone]  he said he has one pill left and he is worried to go without it.

## 2021-05-06 ENCOUNTER — Other Ambulatory Visit: Payer: Self-pay

## 2021-05-06 MED ORDER — CLOPIDOGREL BISULFATE 75 MG PO TABS: 75.0000 mg | ORAL_TABLET | Freq: Every day | ORAL | 3 refills | Status: DC

## 2021-05-07 ENCOUNTER — Ambulatory Visit (INDEPENDENT_AMBULATORY_CARE_PROVIDER_SITE_OTHER): Payer: Medicare PPO

## 2021-05-07 DIAGNOSIS — I480 Paroxysmal atrial fibrillation: Secondary | ICD-10-CM

## 2021-05-07 DIAGNOSIS — F341 Dysthymic disorder: Secondary | ICD-10-CM | POA: Diagnosis not present

## 2021-05-11 DIAGNOSIS — F341 Dysthymic disorder: Secondary | ICD-10-CM | POA: Diagnosis not present

## 2021-05-12 LAB — CUP PACEART REMOTE DEVICE CHECK
Date Time Interrogation Session: 20220904153059
Implantable Pulse Generator Implant Date: 20210803

## 2021-05-13 DIAGNOSIS — F900 Attention-deficit hyperactivity disorder, predominantly inattentive type: Secondary | ICD-10-CM | POA: Diagnosis not present

## 2021-05-14 DIAGNOSIS — F341 Dysthymic disorder: Secondary | ICD-10-CM | POA: Diagnosis not present

## 2021-05-18 DIAGNOSIS — F341 Dysthymic disorder: Secondary | ICD-10-CM | POA: Diagnosis not present

## 2021-05-19 NOTE — Progress Notes (Signed)
Carelink Summary Report / Loop Recorder 

## 2021-05-21 DIAGNOSIS — F341 Dysthymic disorder: Secondary | ICD-10-CM | POA: Diagnosis not present

## 2021-05-25 DIAGNOSIS — F341 Dysthymic disorder: Secondary | ICD-10-CM | POA: Diagnosis not present

## 2021-05-28 DIAGNOSIS — F341 Dysthymic disorder: Secondary | ICD-10-CM | POA: Diagnosis not present

## 2021-05-29 DIAGNOSIS — L989 Disorder of the skin and subcutaneous tissue, unspecified: Secondary | ICD-10-CM | POA: Diagnosis not present

## 2021-05-29 DIAGNOSIS — R634 Abnormal weight loss: Secondary | ICD-10-CM | POA: Diagnosis not present

## 2021-06-01 DIAGNOSIS — F341 Dysthymic disorder: Secondary | ICD-10-CM | POA: Diagnosis not present

## 2021-06-03 DIAGNOSIS — N400 Enlarged prostate without lower urinary tract symptoms: Secondary | ICD-10-CM | POA: Diagnosis not present

## 2021-06-03 DIAGNOSIS — R948 Abnormal results of function studies of other organs and systems: Secondary | ICD-10-CM | POA: Diagnosis not present

## 2021-06-03 DIAGNOSIS — N5201 Erectile dysfunction due to arterial insufficiency: Secondary | ICD-10-CM | POA: Diagnosis not present

## 2021-06-03 DIAGNOSIS — E291 Testicular hypofunction: Secondary | ICD-10-CM | POA: Diagnosis not present

## 2021-06-03 DIAGNOSIS — F341 Dysthymic disorder: Secondary | ICD-10-CM | POA: Diagnosis not present

## 2021-06-08 ENCOUNTER — Ambulatory Visit (INDEPENDENT_AMBULATORY_CARE_PROVIDER_SITE_OTHER): Payer: Medicare PPO

## 2021-06-08 DIAGNOSIS — G459 Transient cerebral ischemic attack, unspecified: Secondary | ICD-10-CM | POA: Diagnosis not present

## 2021-06-08 DIAGNOSIS — F341 Dysthymic disorder: Secondary | ICD-10-CM | POA: Diagnosis not present

## 2021-06-10 ENCOUNTER — Telehealth: Payer: Self-pay | Admitting: Cardiology

## 2021-06-10 DIAGNOSIS — R634 Abnormal weight loss: Secondary | ICD-10-CM

## 2021-06-10 NOTE — Telephone Encounter (Signed)
Ambulatory referral done to nutritionist.

## 2021-06-10 NOTE — Telephone Encounter (Signed)
Will forward to Dr Bing Matter to see if this is okay .

## 2021-06-10 NOTE — Telephone Encounter (Signed)
Zachary Ponce is calling stating his primary care advised he is needing to see a nutritionist. He is requesting a referral from Dr. Bing Matter and that he be called back to know it was made.

## 2021-06-11 DIAGNOSIS — F341 Dysthymic disorder: Secondary | ICD-10-CM | POA: Diagnosis not present

## 2021-06-15 DIAGNOSIS — L570 Actinic keratosis: Secondary | ICD-10-CM | POA: Diagnosis not present

## 2021-06-15 DIAGNOSIS — F341 Dysthymic disorder: Secondary | ICD-10-CM | POA: Diagnosis not present

## 2021-06-15 LAB — CUP PACEART REMOTE DEVICE CHECK
Date Time Interrogation Session: 20221007153058
Implantable Pulse Generator Implant Date: 20210803

## 2021-06-16 NOTE — Progress Notes (Signed)
Carelink Summary Report / Loop Recorder 

## 2021-06-18 DIAGNOSIS — F341 Dysthymic disorder: Secondary | ICD-10-CM | POA: Diagnosis not present

## 2021-06-22 DIAGNOSIS — F341 Dysthymic disorder: Secondary | ICD-10-CM | POA: Diagnosis not present

## 2021-06-25 DIAGNOSIS — F341 Dysthymic disorder: Secondary | ICD-10-CM | POA: Diagnosis not present

## 2021-06-29 DIAGNOSIS — F341 Dysthymic disorder: Secondary | ICD-10-CM | POA: Diagnosis not present

## 2021-07-02 DIAGNOSIS — F341 Dysthymic disorder: Secondary | ICD-10-CM | POA: Diagnosis not present

## 2021-07-03 DIAGNOSIS — F341 Dysthymic disorder: Secondary | ICD-10-CM | POA: Diagnosis not present

## 2021-07-09 ENCOUNTER — Ambulatory Visit: Payer: Medicare PPO

## 2021-07-13 DIAGNOSIS — F341 Dysthymic disorder: Secondary | ICD-10-CM | POA: Diagnosis not present

## 2021-07-14 DIAGNOSIS — F341 Dysthymic disorder: Secondary | ICD-10-CM | POA: Diagnosis not present

## 2021-07-15 DIAGNOSIS — F909 Attention-deficit hyperactivity disorder, unspecified type: Secondary | ICD-10-CM | POA: Diagnosis not present

## 2021-07-15 DIAGNOSIS — Z0000 Encounter for general adult medical examination without abnormal findings: Secondary | ICD-10-CM | POA: Diagnosis not present

## 2021-07-15 DIAGNOSIS — F419 Anxiety disorder, unspecified: Secondary | ICD-10-CM | POA: Diagnosis not present

## 2021-07-15 DIAGNOSIS — R634 Abnormal weight loss: Secondary | ICD-10-CM | POA: Diagnosis not present

## 2021-07-15 DIAGNOSIS — G250 Essential tremor: Secondary | ICD-10-CM | POA: Diagnosis not present

## 2021-07-15 DIAGNOSIS — I25119 Atherosclerotic heart disease of native coronary artery with unspecified angina pectoris: Secondary | ICD-10-CM | POA: Diagnosis not present

## 2021-07-15 DIAGNOSIS — E291 Testicular hypofunction: Secondary | ICD-10-CM | POA: Diagnosis not present

## 2021-07-16 ENCOUNTER — Ambulatory Visit: Payer: Medicare PPO | Admitting: Dietician

## 2021-07-16 DIAGNOSIS — F341 Dysthymic disorder: Secondary | ICD-10-CM | POA: Diagnosis not present

## 2021-07-17 LAB — CUP PACEART REMOTE DEVICE CHECK
Date Time Interrogation Session: 20221109152621
Implantable Pulse Generator Implant Date: 20210803

## 2021-07-20 ENCOUNTER — Other Ambulatory Visit: Payer: Self-pay

## 2021-07-20 ENCOUNTER — Encounter: Payer: Medicare PPO | Attending: Cardiology | Admitting: Registered"

## 2021-07-20 ENCOUNTER — Encounter: Payer: Self-pay | Admitting: Registered"

## 2021-07-20 DIAGNOSIS — F341 Dysthymic disorder: Secondary | ICD-10-CM | POA: Diagnosis not present

## 2021-07-20 DIAGNOSIS — R634 Abnormal weight loss: Secondary | ICD-10-CM | POA: Diagnosis not present

## 2021-07-20 DIAGNOSIS — Z6822 Body mass index (BMI) 22.0-22.9, adult: Secondary | ICD-10-CM | POA: Insufficient documentation

## 2021-07-20 HISTORY — DX: Abnormal weight loss: R63.4

## 2021-07-20 NOTE — Patient Instructions (Signed)
Ideas to add calories:  Perfect Protein (refrigerated section, but can be out of refrigerator for a day or 2) GoMacro bars Nurse, adult cream  Read labels for calories (at least 200 for snacks) Use dips, spreads, sauces (check label for sodium and sat fat not too high)  Avoid drinking with meals Limit the amount of carbonated drinks Add walnuts to your oatmeal  Add buttery spread (look for ones with stanols for additional health benefit) to your potatoes, sweet potatoes, toast  When eating salmon include higher calories vegetables.  Keep a 3-day food log prior to your next appointment

## 2021-07-20 NOTE — Progress Notes (Signed)
Medical Nutrition Therapy  Appointment Start time:  66  Appointment End time:  1118  Primary concerns today:  Referral diagnosis: R63.4 (ICD-10-CM) - Weight loss (s/p bypass 3 yrs ago) Preferred learning style: no preference indicated Learning readiness: ready  3 months ago stopped propannol (was taking for tremmors)   NUTRITION ASSESSMENT   Anthropometrics  Wt Readings from Last 3 Encounters:  07/20/21 151 lb 9.6 oz (68.8 kg)  03/25/21 156 lb 3.2 oz (70.9 kg)  03/23/21 155 lb (70.3 kg)   Clinical Medical Hx: reviewed Medications: reviewed Pt reports propranolol was discontinued about 3 months ago but restarted d/t worsening sxs of hand tremmors Labs: reviewed Notable Signs/Symptoms: weight loss  Lifestyle & Dietary Hx Pt reports he recently was on a cruise and gain some weight but lost his gains since returning home.  Pt reports prior to weight loss he was 179 lbs. After 2019 quadruple bypass weighed mid-160's and gained 4 lbs after rehab x3-4 months. Patient did a lot of walking after rehab, but with stopped going to the [facility] due to concerns of COVID in the community.  Pt states he doesn't think weight loss is due to adderal because he has taken it for 10 years. Pt states after surgery he made some changes to his diet including cut out sodas, cut back beer to 1 per month, no milk shakes.   Estimated daily fluid intake: ~45 oz total with water and other beverages Supplements: [employer] mens vitamin Sleep: 7 hrs  Stress / self-care: 5/10 (was higher recently but has improved) Current average weekly physical activity: 2- 3.5 miles every day, walk dogs, rowing machine, dumb bells, pushups.  24-Hr Dietary Recall First Meal: high fiber cereal -2 bowls OR instant oatmeal, berries OR eggs, toast, butter maybe jelly Snack: muffin Second Meal: sandwich, low sodium ham OR vegetable, meat bowl OR salad restaurant Snack: Tostitos Third Meal: center cut pork chop, sweet potato, vegetable OR  chicken tenders OR meatloaf OR Outback salad with chicken or steak Snack: chips OR gluten-free girl scout cookies (wife is trying to lose weight) Beverages: carbonated drinks, ~32 oz, cranberry juice and ginger ale 4x/week  Estimated Energy Needs Calories: ~2400 for weight gain   NUTRITION DIAGNOSIS  NI-1.4 Inadequate energy intake As related to reduced caloric intake taking out sugary foods without replacing calories from other sources.  As evidenced by continued weight loss over 3 yrs.   NUTRITION INTERVENTION  Nutrition education (E-1) on the following topics:  Saturated fat vs unsaturated fat Food with higher calorie content Strategies to consume more calories  Handouts Provided Include  none  Learning Style & Readiness for Change Teaching method utilized: Visual & Auditory  Demonstrated degree of understanding via: Teach Back  Barriers to learning/adherence to lifestyle change: none  Goals Established by Pt Ideas to add calories:  Perfect Protein (refrigerated section, but can be out of refrigerator for a day or 2) GoMacro bars [employer] cream  Read labels for calories (at least 200 for snacks) Use dips, spreads, sauces (check label for sodium and sat fat not too high)  Avoid drinking with meals Limit the amount of carbonated drinks Add walnuts to your oatmeal  Add buttery spread (look for ones with stanols for additional health benefit) to your potatoes, sweet potatoes, toast  When eating salmon include higher calories vegetables.  Keep a 3-day food log prior to your next appointment   MONITORING & EVALUATION Dietary intake, weekly physical activity, and labs in 1 month.  Next Steps  Patient is to work on goals and return for weight check.

## 2021-07-24 DIAGNOSIS — F341 Dysthymic disorder: Secondary | ICD-10-CM | POA: Diagnosis not present

## 2021-07-26 DIAGNOSIS — J0100 Acute maxillary sinusitis, unspecified: Secondary | ICD-10-CM | POA: Diagnosis not present

## 2021-07-26 DIAGNOSIS — J069 Acute upper respiratory infection, unspecified: Secondary | ICD-10-CM | POA: Diagnosis not present

## 2021-07-27 DIAGNOSIS — F341 Dysthymic disorder: Secondary | ICD-10-CM | POA: Diagnosis not present

## 2021-07-29 DIAGNOSIS — F341 Dysthymic disorder: Secondary | ICD-10-CM | POA: Diagnosis not present

## 2021-08-03 DIAGNOSIS — F341 Dysthymic disorder: Secondary | ICD-10-CM | POA: Diagnosis not present

## 2021-08-06 DIAGNOSIS — F341 Dysthymic disorder: Secondary | ICD-10-CM | POA: Diagnosis not present

## 2021-08-07 ENCOUNTER — Encounter: Payer: Self-pay | Admitting: Physician Assistant

## 2021-08-07 ENCOUNTER — Other Ambulatory Visit: Payer: Self-pay

## 2021-08-07 ENCOUNTER — Ambulatory Visit: Payer: Medicare PPO | Admitting: Physician Assistant

## 2021-08-07 ENCOUNTER — Ambulatory Visit (HOSPITAL_COMMUNITY)
Admission: RE | Admit: 2021-08-07 | Discharge: 2021-08-07 | Disposition: A | Discharge status: Home / Self Care | Payer: Medicare PPO | Source: Ambulatory Visit | Attending: Physician Assistant | Admitting: Physician Assistant

## 2021-08-07 VITALS — BP 103/56 | HR 72 | Temp 98.3°F | Resp 20 | Ht 69.5 in | Wt 154.0 lb

## 2021-08-07 DIAGNOSIS — I6523 Occlusion and stenosis of bilateral carotid arteries: Secondary | ICD-10-CM | POA: Insufficient documentation

## 2021-08-07 NOTE — Progress Notes (Signed)
Office Note     CC:  follow up Requesting Provider:  [name], MD  HPI: Zachary Ponce is a 70 y.o. (1951-06-08) male who presents for follow up of carotid artery stenosis. He has hx of combination CABG and left CEA with Dr. Alla German and Dr. Darrick Penna in July 2019. Preoperative duplex demonstrated bilateral critical carotid stenosis 80 to 99% by duplex. Postoperatively right carotid duplex demonstrated only 60 to 79% by duplex. We have been following his right carotid stenosis closely since on routine surveillance follow up.   The patient denies symptoms of TIA or stroke. He denies any visual changes, amaurosis fugax, slurred speech, facial drooping, unilateral upper or lower extremity weakness or numbness. He says he has an tremor in his left hand that he has had for 15+ years. He was originally taking a BB for it but it was slowing his HR too much that his cardiologist took him off of it. They tried him on some anti seizure medications but he had to many bad side effects to continue them. He says the tremor is 10-15% worse now then when he was on the BB but it is very minimally interfering with is daily activities.   The pt is on a statin for cholesterol management.  The pt is not on a daily aspirin.   Other AC:  Plavix The pt is on BB for hypertension.   The pt is not diabetic.   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   Bilateral carotid artery disease (HCC) 03/07/2018   Left CEA 03/08/18 at time of CABG    CAD (coronary artery disease) 03/03/2018   Cardiac cath  03/03/18 70% distal left main, 99% proximal LAD, 40-50% proximal circumflex, 60% intermediate, 90% ostial right coronary artery, 55% LVEF with some mild distal anterior hypokinesis   Depression 06/30/2016   Dyslipidemia 07/24/2015   The 10-year ASCVD risk score [name], et al., 2013) is: 15.7%   Values used to  calculate the score:     Age: 66 years     Sex: Male     Is Non-Hispanic African American: No     Diabetic: No     Tobacco smoker: No     Systolic Blood Pressure: 114 mmHg     Is BP treated: No     HDL Cholesterol: 32 mg/dL     Total Cholesterol: 209 mg/dL The 25-KNLZ ASCVD risk score [name], et al., 2013) is   Essential tremor    Generalized anxiety disorder 01/22/2016   History of traumatic head injury    Hyperlipidemia 03/03/2018   Hypogonadism in male 06/20/2018   Hypotension after procedure 06/22/2018   Memory loss 06/30/2016   On amiodarone therapy 06/22/2018   PAF (paroxysmal atrial fibrillation) (HCC) 06/22/2018   Post concussion syndrome 06/30/2016   Post-concussion headache 06/30/2016   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 LIMA TO LAD, SVG TO OM, SVG TO RAMUS, SVG TO PD.;  Surgeon: [name], MD;  Location: [facility] OR;  Service: Open Heart Surgery;  Laterality: N/A;   ENDARTERECTOMY Left 03/08/2018   Procedure: ENDARTERECTOMY CAROTID;  Surgeon: Fabienne Bruns  E, MD;  Location: [facility] OR;  Service: Vascular;  Laterality: Left;   KNEE SURGERY     LEFT HEART CATH AND CORONARY ANGIOGRAPHY N/A 03/03/2018   Procedure: LEFT HEART CATH AND CORONARY ANGIOGRAPHY;  Surgeon: [name], MD;  Location: [facility];  Service: Cardiovascular;  Laterality: N/A;   TEE WITHOUT CARDIOVERSION N/A 03/08/2018   Procedure: TRANSESOPHAGEAL ECHOCARDIOGRAM (TEE);  Surgeon: [name], [name], MD;  Location: [facility];  Service: Open Heart Surgery;  Laterality: N/A;   TONSILLECTOMY      Social History   Socioeconomic History   Marital status: Married    Spouse name: Not on file   Number of children: Not on file   Years of education: Not on file   Highest education  level: Not on file  Occupational History   Not on file  Tobacco Use   Smoking status: Never   Smokeless tobacco: Never  Vaping Use   Vaping Use: Never used  Substance and Sexual Activity   Alcohol use: Not Currently   Drug use: Never   Sexual activity: Yes  Other Topics Concern   Not on file  Social History Narrative   Left handed   3 story home   Drink decaffaine   Social Determinants of Health   Financial Resource Strain: Not on file  Food Insecurity: No Food Insecurity   Worried About Running Out of Food in the Last Year: Never true   Ran Out of Food in the Last Year: Never true  Transportation Needs: Not on file  Physical Activity: Not on file  Stress: Not on file  Social Connections: Not on file  Intimate Partner Violence: Not on file    Family History  Problem Relation Age of Onset   Lung cancer Mother    Diabetes Brother     Current Outpatient Medications  Medication Sig Dispense Refill   atorvastatin (LIPITOR) 20 MG tablet TAKE 1 TABLET BY MOUTH EVERY DAY AT 6PM (Patient taking differently: Take 20 mg by mouth daily.) 90 tablet 3   buPROPion (WELLBUTRIN XL) 300 MG 24 hr tablet Take 300 mg by mouth daily.     clopidogrel (PLAVIX) 75 MG tablet Take 1 tablet (75 mg total) by mouth daily. 90 tablet 3   DEXTROAMPHETAMINE SULFATE PO Take by mouth.     escitalopram (LEXAPRO) 5 MG tablet Take 5 mg by mouth daily.  0   NON FORMULARY Apply 50 mg topically daily. Testosterone 50mg /ml  0   No current facility-administered medications for this visit.   Facility-Administered Medications Ordered in Other Visits  Medication Dose Route Frequency Provider Last Rate Last Admin   sodium chloride flush (NS) 0.9 % injection 10 mL  10 mL Intravenous PRN Jaffe, Adam R, DO   20 mL at 03/20/20 1125    No Known Allergies   REVIEW OF SYSTEMS:  [X]  denotes positive finding, [ ]  denotes negative finding Cardiac  Comments:  Chest pain or chest pressure:    Shortness of breath  upon exertion:    Short of breath when lying flat:    Irregular heart rhythm:        Vascular    Pain in calf, thigh, or hip brought on by ambulation:    Pain in feet at night that wakes you up from your sleep:     Blood clot in your veins:    Leg swelling:         Pulmonary    Oxygen at home:  Productive cough:     Wheezing:         Neurologic    Sudden weakness in arms or legs:     Sudden numbness in arms or legs:     Sudden onset of difficulty speaking or slurred speech:    Temporary loss of vision in one eye:     Problems with dizziness:         Gastrointestinal    Blood in stool:     Vomited blood:         Genitourinary    Burning when urinating:     Blood in urine:        Psychiatric    Major depression:         Hematologic    Bleeding problems:    Problems with blood clotting too easily:        Skin    Rashes or ulcers:        Constitutional    Fever or chills:      PHYSICAL EXAMINATION:  Vitals:   08/07/21 1040 08/07/21 1042  BP: 114/65 (!) 103/56  Pulse: 72   Resp: 20   Temp: 98.3 F (36.8 C)   SpO2: 99%   Weight: 154 lb (69.9 kg)   Height: 5' 9.5" (1.765 m)     General:  WDWN in NAD; vital signs documented above Gait: Normal HENT: WNL, normocephalic Pulmonary: normal non-labored breathing , without wheezing Cardiac: regular HR, without  Murmurs without carotid bruit Abdomen: soft, NT, no masses Vascular Exam/Pulses:  Right Left  Radial 2+ (normal) 2+ (normal)  Femoral 2+ (normal) 2+ (normal)  Popliteal Not palpable Not palpable  DP 2+ (normal) 2+ (normal)  PT 2+ (normal) 2+ (normal)   Extremities: without ischemic changes, without Gangrene , without cellulitis; without open wounds;  Musculoskeletal: no muscle wasting or atrophy  Neurologic: A&O X 3;  No focal weakness or paresthesias are detected Psychiatric:  The pt has Normal affect.   Non-Invasive Vascular Imaging:   VAS Carotid Duplex: Summary:  Right Carotid: Velocities  in the right ICA are consistent with a 40-59% stenosis. Non-hemodynamically significant plaque <50% noted in the CCA.   Left Carotid: Velocities in the left ICA are consistent with a 1-39% stenosis.   Vertebrals:  Bilateral vertebral arteries demonstrate antegrade flow.  Subclavians: Normal flow hemodynamics were seen in bilateral subclavian arteries.    ASSESSMENT/PLAN:: 70 y.o. male here for follow up for of carotid artery stenosis. He has hx of combination CABG and left CEA with Dr. Alla German and Dr. Darrick Penna in July 2019.He has had known right ICA stenosis of 60-79% on duplex follow up. He remains without any TIA or stroke like symptoms. His duplex today shows 40-59%. Ultrasound tech was unable to reproduce same velocities as on prior examination.  - continue Statin and Plavix - Reviewed signs and symptoms of TIA and stroke and he understands should these occur to seek immediate medical attention - he will follow up in 6 months with repeat carotid duplex   Graceann Congress, PA-C [facility] [phone]  On call MD:  Edilia Bo

## 2021-08-10 DIAGNOSIS — F341 Dysthymic disorder: Secondary | ICD-10-CM | POA: Diagnosis not present

## 2021-08-11 ENCOUNTER — Other Ambulatory Visit: Payer: Self-pay | Admitting: *Deleted

## 2021-08-11 DIAGNOSIS — I6523 Occlusion and stenosis of bilateral carotid arteries: Secondary | ICD-10-CM

## 2021-08-13 DIAGNOSIS — F341 Dysthymic disorder: Secondary | ICD-10-CM | POA: Diagnosis not present

## 2021-08-17 DIAGNOSIS — F341 Dysthymic disorder: Secondary | ICD-10-CM | POA: Diagnosis not present

## 2021-08-18 ENCOUNTER — Ambulatory Visit (INDEPENDENT_AMBULATORY_CARE_PROVIDER_SITE_OTHER): Payer: Medicare PPO

## 2021-08-18 DIAGNOSIS — G459 Transient cerebral ischemic attack, unspecified: Secondary | ICD-10-CM | POA: Diagnosis not present

## 2021-08-18 LAB — CUP PACEART REMOTE DEVICE CHECK
Date Time Interrogation Session: 20221212152522
Implantable Pulse Generator Implant Date: 20210803

## 2021-08-20 DIAGNOSIS — F341 Dysthymic disorder: Secondary | ICD-10-CM | POA: Diagnosis not present

## 2021-08-25 DIAGNOSIS — F900 Attention-deficit hyperactivity disorder, predominantly inattentive type: Secondary | ICD-10-CM | POA: Diagnosis not present

## 2021-08-28 NOTE — Progress Notes (Signed)
Carelink Summary Report / Loop Recorder 

## 2021-09-07 ENCOUNTER — Ambulatory Visit: Payer: Medicare PPO | Admitting: Registered"

## 2021-09-07 DIAGNOSIS — F341 Dysthymic disorder: Secondary | ICD-10-CM | POA: Diagnosis not present

## 2021-09-10 DIAGNOSIS — F341 Dysthymic disorder: Secondary | ICD-10-CM | POA: Diagnosis not present

## 2021-09-14 DIAGNOSIS — F341 Dysthymic disorder: Secondary | ICD-10-CM | POA: Diagnosis not present

## 2021-09-17 DIAGNOSIS — F341 Dysthymic disorder: Secondary | ICD-10-CM | POA: Diagnosis not present

## 2021-09-21 ENCOUNTER — Ambulatory Visit: Payer: Medicare PPO | Admitting: Cardiology

## 2021-09-21 ENCOUNTER — Other Ambulatory Visit: Payer: Self-pay

## 2021-09-21 ENCOUNTER — Ambulatory Visit (INDEPENDENT_AMBULATORY_CARE_PROVIDER_SITE_OTHER): Payer: Medicare PPO

## 2021-09-21 ENCOUNTER — Encounter: Payer: Self-pay | Admitting: Cardiology

## 2021-09-21 VITALS — BP 130/50 | HR 74 | Ht 70.0 in | Wt 153.0 lb

## 2021-09-21 DIAGNOSIS — G459 Transient cerebral ischemic attack, unspecified: Secondary | ICD-10-CM

## 2021-09-21 DIAGNOSIS — R251 Tremor, unspecified: Secondary | ICD-10-CM

## 2021-09-21 DIAGNOSIS — R634 Abnormal weight loss: Secondary | ICD-10-CM | POA: Diagnosis not present

## 2021-09-21 DIAGNOSIS — E785 Hyperlipidemia, unspecified: Secondary | ICD-10-CM

## 2021-09-21 DIAGNOSIS — F341 Dysthymic disorder: Secondary | ICD-10-CM | POA: Diagnosis not present

## 2021-09-21 DIAGNOSIS — Z951 Presence of aortocoronary bypass graft: Secondary | ICD-10-CM

## 2021-09-21 LAB — CUP PACEART REMOTE DEVICE CHECK
Date Time Interrogation Session: 20230115230743
Implantable Pulse Generator Implant Date: 20210803

## 2021-09-21 MED ORDER — PROPRANOLOL HCL ER 80 MG PO CP24: 80.0000 mg | ORAL_CAPSULE | Freq: Every day | ORAL | 3 refills | Status: DC

## 2021-09-21 NOTE — Addendum Note (Signed)
Addended by: Roosvelt Harps R on: 09/21/2021 01:30 PM   Modules accepted: Orders

## 2021-09-21 NOTE — Patient Instructions (Signed)
Medication Instructions:  Your physician has recommended you make the following change in your medication:  START INDERAL LA 80 MG ONCE DAILY  *If you need a refill on your cardiac medications before your next appointment, please call your pharmacy*   Lab Work: NONE If you have labs (blood work) drawn today and your tests are completely normal, you will receive your results only by: MyChart Message (if you have MyChart) OR A paper copy in the mail If you have any lab test that is abnormal or we need to change your treatment, we will call you to review the results.   Testing/Procedures: NONE   Follow-Up: At [facility], you and your health needs are our priority.  As part of our continuing mission to provide you with exceptional heart care, we have created designated Provider Care Teams.  These Care Teams include your primary Cardiologist (physician) and Advanced Practice Providers (APPs -  Physician Assistants and Nurse Practitioners) who all work together to provide you with the care you need, when you need it.  We recommend signing up for the patient portal called "MyChart".  Sign up information is provided on this After Visit Summary.  MyChart is used to connect with patients for Virtual Visits (Telemedicine).  Patients are able to view lab/test results, encounter notes, upcoming appointments, etc.  Non-urgent messages can be sent to your provider as well.   To learn more about what you can do with MyChart, go to [email or web address].    Your next appointment:   6 month(s)  The format for your next appointment:   In Person  Provider:   [name], MD    Other Instructions

## 2021-09-21 NOTE — Progress Notes (Signed)
Cardiology Office Note:    Date:  09/21/2021   ID:  Zachary Ponce, DOB 08-Oct-1950, MRN [id number]  PCP:  Shirline Frees, MD  Cardiologist:  Jenne Campus, MD    Referring MD: Shirline Frees, MD   Chief Complaint  Patient presents with   Medication Management        followup monitor    History of Present Illness:    Zachary Ponce is a 71 y.o. male   with past medical history significant for coronary artery disease, status post coronary to bypass graft only 2019, carotic arterial disease with up to 79% stenosis on the right side, also subclavian artery stenosis not critical, narrowing of the vertebral arteries.  He does have implantable loop recorder because of episode of TIA and basically looking for evidence of atrial fibrillation.  He comes today to my office for follow-up overall he seems to be doing fine however the tremor became really troubling.  He wants to go back on propranolol.  He denies have any chest pain tightness squeezing pressure burning chest still doing some exercise but admits that his lack of during the holiday.  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   Bilateral carotid artery disease ([place]) 03/07/2018   Left CEA 03/08/18 at time of CABG    CAD (coronary artery disease) 03/03/2018   Cardiac cath  03/03/18 70% distal left main, 99% proximal LAD, 40-50% proximal circumflex, 60% intermediate, 90% ostial right coronary artery, 55% LVEF with some mild distal anterior hypokinesis   Depression 06/30/2016   Dyslipidemia 07/24/2015   The 10-year ASCVD risk score [name] DC Jr., et al., 2013) is: 15.7%   Values used to calculate the score:     Age: 3 years     Sex: Male     Is Non-Hispanic African American: No     Diabetic: No     Tobacco smoker: No     Systolic Blood Pressure: 99991111 mmHg     Is BP treated: No     HDL Cholesterol: 32 mg/dL     Total Cholesterol: 209 mg/dL The  10-year ASCVD risk score [name] DC Jr., et al., 2013) is   Essential tremor    Generalized anxiety disorder 01/22/2016   History of traumatic head injury    Hyperlipidemia 03/03/2018   Hypogonadism in male 06/20/2018   Hypotension after procedure 06/22/2018   Memory loss 06/30/2016   On amiodarone therapy 06/22/2018   PAF (paroxysmal atrial fibrillation) ([place]) 06/22/2018   Post concussion syndrome 06/30/2016   Post-concussion headache 06/30/2016   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 LIMA TO LAD, SVG TO OM, SVG TO RAMUS, SVG TO PD.;  Surgeon: Ivin Poot, MD;  Location: [place];  Service: Open Heart Surgery;  Laterality: N/A;   ENDARTERECTOMY Left 03/08/2018   Procedure: ENDARTERECTOMY CAROTID;  Surgeon: Elam Dutch, MD;  Location: [place];  Service: Vascular;  Laterality: Left;   KNEE SURGERY     LEFT HEART CATH AND CORONARY ANGIOGRAPHY N/A 03/03/2018   Procedure: LEFT HEART CATH AND CORONARY ANGIOGRAPHY;  Surgeon: [place] Sine, MD;  Location: [place] [facility];  Service: Cardiovascular;  Laterality: N/A;  TEE WITHOUT CARDIOVERSION N/A 03/08/2018   Procedure: TRANSESOPHAGEAL ECHOCARDIOGRAM (TEE);  Surgeon: Prescott Gum, Collier Salina, MD;  Location: [place];  Service: Open Heart Surgery;  Laterality: N/A;   TONSILLECTOMY      Current Medications: Current Meds  Medication Sig   amphetamine-dextroamphetamine (ADDERALL XR) 30 MG 24 hr capsule Take 1 capsule by mouth daily.   atorvastatin (LIPITOR) 20 MG tablet TAKE 1 TABLET BY MOUTH EVERY DAY AT 6PM   buPROPion (WELLBUTRIN XL) 300 MG 24 hr tablet Take 300 mg by mouth daily.   clopidogrel (PLAVIX) 75 MG tablet Take 1 tablet (75 mg total) by mouth daily.   escitalopram (LEXAPRO) 5 MG tablet Take 10 mg by mouth  daily.   NON FORMULARY Apply 50 mg topically daily. Testosterone 50mg /ml     Allergies:   Patient has no known allergies.   Social History   Socioeconomic History   Marital status: Married    Spouse name: Not on file   Number of children: Not on file   Years of education: Not on file   Highest education level: Not on file  Occupational History   Not on file  Tobacco Use   Smoking status: Never   Smokeless tobacco: Never  Vaping Use   Vaping Use: Never used  Substance and Sexual Activity   Alcohol use: Not Currently   Drug use: Never   Sexual activity: Yes  Other Topics Concern   Not on file  Social History Narrative   Left handed   3 story home   Drink decaffaine   Social Determinants of Health   Financial Resource Strain: Not on file  Food Insecurity: No Food Insecurity   Worried About Charity fundraiser in the Last Year: Never true   Ran Out of Food in the Last Year: Never true  Transportation Needs: Not on file  Physical Activity: Not on file  Stress: Not on file  Social Connections: Not on file     Family History: The patient's family history includes Diabetes in his brother; Lung cancer in his mother. ROS:   Please see the history of present illness.    All 14 point review of systems negative except as described per history of present illness  EKGs/Labs/Other Studies Reviewed:      Recent Labs: No results found for requested labs within last 8760 hours.  Recent Lipid Panel    Component Value Date/Time   CHOL 116 09/02/2020 0821   TRIG 104 09/02/2020 0821   HDL 37 (L) 09/02/2020 0821   CHOLHDL 3.1 09/02/2020 0821   LDLCALC 60 09/02/2020 0821    Physical Exam:    VS:  BP (!) 130/50 (BP Location: Right Arm, Patient Position: Sitting)    Pulse 74    Ht 5\' 10"  (1.778 m)    Wt 153 lb (69.4 kg)    SpO2 96%    BMI 21.95 kg/m     Wt Readings from Last 3 Encounters:  09/21/21 153 lb (69.4 kg)  08/07/21 154 lb (69.9 kg)  07/20/21 151 lb 9.6 oz  (68.8 kg)     GEN:  Well nourished, well developed in no acute distress HEENT: Normal NECK: No JVD; No carotid bruits LYMPHATICS: No lymphadenopathy CARDIAC: RRR, no murmurs, no rubs, no gallops RESPIRATORY:  Clear to auscultation without rales, wheezing or rhonchi  ABDOMEN: Soft, non-tender, non-distended MUSCULOSKELETAL:  No edema; No deformity  SKIN: Warm and dry LOWER EXTREMITIES: no swelling NEUROLOGIC:  Alert and oriented x 3 PSYCHIATRIC:  Normal affect   ASSESSMENT:    1. S/P CABG x 4   2. TIA (transient ischemic attack)   3. Unintentional weight loss   4. Dyslipidemia   5. Tremor    PLAN:    In order of problems listed above:  Status post coronary bypass graft stable from that point review.  On Plavix which I will continue. History of TIA denies having any. Unintentional weight loss that being follow-up in investigated by primary care physician seems to gaining some weight. Dyslipidemia I did review his K PN which show me data from last year with LDL 60 HDL 37.  Continue present management. Tremor: He would like to go back on long-acting propranolol which we will do with caution I told him that the dizziness passing out he must stop.  Also he noticed some increased fatigue he need to stop it.  There was also an issue about potentially worsening depression however he was taking the medication before had no difficulty doing it. Cardiac ultrasound reviewed done in December showing no critical lesions   Medication Adjustments/Labs and Tests Ordered: Current medicines are reviewed at length with the patient today.  Concerns regarding medicines are outlined above.  No orders of the defined types were placed in this encounter.  Medication changes: No orders of the defined types were placed in this encounter.   Signed, Park Liter, MD, [facility] 09/21/2021 1:23 PM    [place]

## 2021-09-24 DIAGNOSIS — F341 Dysthymic disorder: Secondary | ICD-10-CM | POA: Diagnosis not present

## 2021-09-28 DIAGNOSIS — F341 Dysthymic disorder: Secondary | ICD-10-CM | POA: Diagnosis not present

## 2021-09-29 NOTE — Progress Notes (Deleted)
NEUROLOGY FOLLOW UP OFFICE NOTE  Zachary Ponce [id number]  Assessment/Plan:   Transient ischemic attack  Essential tremor  Right carotid artery stenosis  Dyslipidemia   1.For tremor:  He will retry primidone - 25mg  in evening (earlier than bedtime) for one week, then increase to 50mg .  If still with nightmares, we can try taking it in morning.  If his loop recorder detects a fib and he needs to be changed to anticoagulation, I told him that we would have to stop primidone.  Another option is topiramate (discussed side effects) 2.Secondary stroke prevention as managed by PCP: Plavix 75mg  daily -  Statin therapy.  LDL goal less than 70 -  Glycemic control.  Hgb A1c goal less than 7 -  Normotensive blood pressure 3. Carotid stenosis monitored by vascular surgery 4. A fib monitoring with loop recorder 5. Follow up 6 months.   Subjective:  Zachary Ponce is a 71 year old ambidextrous Caucasian male with CAD s/p coronary artery bypass grafting, left carotid artery disease s/p CEA, HLD, depression and anxiety, essential tremor and history of traumatic brain injury and TIA who follows up for essential tremor.   UPDATE: Current medications:  Plavix 75mg  daily, atorvastatin 20mg  daily, propranolol ER 80mg  daily (higher doses caused hypotension)  He was advised to retry primidone which continued to cause nightmares.  In August he was started on topiramate ***   He is with implantable loop recorder.  No atrial fibrillation has yet been identified.  ***        HISTORY:  In late April 2021, he had an episode of dizziness, nausea, vomiting.  He was laying on the bed with his right arm laying over the side and couldn't move it for about 5 minutes.  EMS was called and blood pressure was reportedly 160 systolic and EKG reportedly normal.     Carotid ultrasound on 01/16/2020 showed 60-79% right ICA stenosis and 1-39% left ICA stenosis.  MRI of brain without contrast on 01/18/2020 personally  reviewed showed mild chronic small vessel ischemic changes and small remote left frontal infact but no acute intracranial abnormality.     CTA of head and neck from 03/17/2020 personally reviewed showed moderate M1 MCA stenoses bilaterally and 65-70% proximal right ICA stenosis and moderate stenosis at origin of the dominant right vertebral artery.  2D echocardiogram on 03/20/2020 showed EF 60-65% with no cardiac source of embolus.  Carotid ultrasound on 01/28/2021 showed right ICA 60-79% stenosis, no hemodynamically significant stenosis of left ICA   In 2019, he underwent left CEA for carotid artery stenosis and coronary bypass grafting.  Following procedure, he did have episodes of atrial fibrillation and was on Eliquis for a period of time.    He also has essential tremor.  Primidone caused nightmares.  He is on propranolol ER 80mg  but cannot increase dose due to bradycardia and hypotension.    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   Bilateral carotid artery disease (HCC) 03/07/2018   Left CEA 03/08/18 at time of CABG    CAD (coronary artery disease) 03/03/2018   Cardiac cath  03/03/18 70% distal left main, 99% proximal LAD, 40-50% proximal circumflex, 60% intermediate, 90% ostial right coronary artery, 55% LVEF with some mild distal anterior hypokinesis   Depression 06/30/2016   Dyslipidemia 07/24/2015   The 10-year ASCVD risk score 07/02/2016 DC Jr., et al., 2013) is: 15.7%  Values used to calculate the score:     Age: 34 years     Sex: Male     Is Non-Hispanic African American: No     Diabetic: No     Tobacco smoker: No     Systolic Blood Pressure: 114 mmHg     Is BP treated: No     HDL Cholesterol: 32 mg/dL     Total Cholesterol: 209 mg/dL The 16-LAGT ASCVD risk score [name], et al., 2013) is   Essential tremor    Generalized anxiety disorder 01/22/2016   History of traumatic  head injury    Hyperlipidemia 03/03/2018   Hypogonadism in male 06/20/2018   Hypotension after procedure 06/22/2018   Memory loss 06/30/2016   On amiodarone therapy 06/22/2018   PAF (paroxysmal atrial fibrillation) (HCC) 06/22/2018   Post concussion syndrome 06/30/2016   Post-concussion headache 06/30/2016   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     atorvastatin (LIPITOR) 20 MG tablet TAKE 1 TABLET BY MOUTH EVERY DAY AT 6PM 90 tablet 3   buPROPion (WELLBUTRIN XL) 300 MG 24 hr tablet Take 300 mg by mouth daily.     clopidogrel (PLAVIX) 75 MG tablet Take 1 tablet (75 mg total) by mouth daily. 90 tablet 3   escitalopram (LEXAPRO) 5 MG tablet Take 10 mg by mouth daily.  0   NON FORMULARY Apply 50 mg topically daily. Testosterone 50mg /ml  0   propranolol ER (INDERAL LA) 80 MG 24 hr capsule Take 1 capsule (80 mg total) by mouth daily. 90 capsule 3   Current Facility-Administered Medications on File Prior to Visit  Medication Dose Route Frequency Provider Last Rate Last Admin   sodium chloride flush (NS) 0.9 % injection 10 mL  10 mL Intravenous PRN Artasia Thang R, DO   20 mL at 03/20/20 1125    ALLERGIES: No Known Allergies  FAMILY HISTORY: Family History  Problem Relation Age of Onset   Lung cancer Mother    Diabetes Brother       Objective:  *** General: No acute distress.  Patient appears ***-groomed.   Head:  Normocephalic/atraumatic Eyes:  Fundi examined but not visualized Neck: supple, no paraspinal tenderness, full range of motion Heart:  Regular rate and rhythm Lungs:  Clear to auscultation bilaterally Back: No paraspinal tenderness Neurological Exam: alert and oriented to person, place, and time.  Speech fluent and not dysarthric, language  intact.  CN II-XII intact. Bulk and tone normal, muscle strength 5/5 throughout.  Sensation to light touch intact.  Deep tendon reflexes 2+ throughout, toes downgoing.  Finger to nose testing intact.  Gait normal, Romberg negative.   03/22/20, DO  CC: ***

## 2021-09-30 ENCOUNTER — Ambulatory Visit: Payer: Medicare PPO | Admitting: Neurology

## 2021-10-01 DIAGNOSIS — F341 Dysthymic disorder: Secondary | ICD-10-CM | POA: Diagnosis not present

## 2021-10-02 NOTE — Progress Notes (Signed)
Carelink Summary Report / Loop Recorder 

## 2021-10-05 DIAGNOSIS — F341 Dysthymic disorder: Secondary | ICD-10-CM | POA: Diagnosis not present

## 2021-10-08 DIAGNOSIS — F341 Dysthymic disorder: Secondary | ICD-10-CM | POA: Diagnosis not present

## 2021-10-12 DIAGNOSIS — F341 Dysthymic disorder: Secondary | ICD-10-CM | POA: Diagnosis not present

## 2021-10-13 DIAGNOSIS — F900 Attention-deficit hyperactivity disorder, predominantly inattentive type: Secondary | ICD-10-CM | POA: Diagnosis not present

## 2021-10-15 DIAGNOSIS — F341 Dysthymic disorder: Secondary | ICD-10-CM | POA: Diagnosis not present

## 2021-10-19 DIAGNOSIS — F341 Dysthymic disorder: Secondary | ICD-10-CM | POA: Diagnosis not present

## 2021-10-19 NOTE — Progress Notes (Signed)
NEUROLOGY FOLLOW UP OFFICE NOTE  Zachary Ponce [id number]  Assessment/Plan:   Transient ischemic attack  Essential tremor  Right carotid artery stenosis  Dyslipidemia   1.For tremor:  continue propranolol ER 80mg  daily 2.Secondary stroke prevention as managed by PCP: Plavix 75mg  daily -  Statin therapy.  LDL goal less than 70 -  Glycemic control.  Hgb A1c goal less than 7 -  Normotensive blood pressure 3. Carotid stenosis monitored by vascular surgery 4. A fib monitoring with loop recorder 5. Follow up 9 months.   Subjective:  Zachary Ponce is a 71 year old ambidextrous Caucasian male with CAD s/p coronary artery bypass grafting, left carotid artery disease s/p CEA, HLD, depression and anxiety, essential tremor and history of traumatic brain injury and TIA who follows up for essential tremor.   UPDATE: Current medications:  Plavix 75mg  daily, atorvastatin 20mg  daily, propranolol ER 80mg  daily (higher doses caused hypotension)  He was advised to retry primidone which continued to cause nightmares.  In August he was started on topiramate.  However, it made him feel "wired". He contacted his cardiologist and requested to go back on propranolol.  It has been helpful.  He is able to write and use utensils.     He is with implantable loop recorder.  No atrial fibrillation has yet been identified.  Carotid ultrasound in December showed right ICA of 40-59% stenosis.          HISTORY:  In late April 2021, he had an episode of dizziness, nausea, vomiting.  He was laying on the bed with his right arm laying over the side and couldn't move it for about 5 minutes.  EMS was called and blood pressure was reportedly 0000000 systolic and EKG reportedly normal.     Carotid ultrasound on 01/16/2020 showed 60-79% right ICA stenosis and 1-39% left ICA stenosis.  MRI of brain without contrast on 01/18/2020 personally reviewed showed mild chronic small vessel ischemic changes and small remote left  frontal infact but no acute intracranial abnormality.     CTA of head and neck from 03/17/2020 personally reviewed showed moderate M1 MCA stenoses bilaterally and 65-70% proximal right ICA stenosis and moderate stenosis at origin of the dominant right vertebral artery.  2D echocardiogram on 03/20/2020 showed EF 60-65% with no cardiac source of embolus.  Carotid ultrasound on 01/28/2021 showed right ICA 60-79% stenosis, no hemodynamically significant stenosis of left ICA   In 2019, he underwent left CEA for carotid artery stenosis and coronary bypass grafting.  Following procedure, he did have episodes of atrial fibrillation and was on Eliquis for a period of time.    He also has essential tremor.  Primidone caused nightmares.  Topiramate made him feel "wired".   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   Bilateral carotid artery disease ([place]) 03/07/2018   Left CEA 03/08/18 at time of CABG    CAD (coronary artery disease) 03/03/2018   Cardiac cath  03/03/18 70% distal left main, 99% proximal LAD, 40-50% proximal circumflex, 60% intermediate, 90% ostial right coronary artery, 55% LVEF with some mild distal anterior hypokinesis   Depression 06/30/2016   Dyslipidemia 07/24/2015   The 10-year ASCVD risk score [name] DC Jr., et al., 2013) is: 15.7%   Values used to calculate the score:     Age: 79 years     Sex: Male     Is  Non-Hispanic African American: No     Diabetic: No     Tobacco smoker: No     Systolic Blood Pressure: 99991111 mmHg     Is BP treated: No     HDL Cholesterol: 32 mg/dL     Total Cholesterol: 209 mg/dL The 10-year ASCVD risk score [name] DC Jr., et al., 2013) is   Essential tremor    Generalized anxiety disorder 01/22/2016   History of traumatic head injury    Hyperlipidemia 03/03/2018   Hypogonadism in male 06/20/2018   Hypotension after procedure 06/22/2018   Memory loss  06/30/2016   On amiodarone therapy 06/22/2018   PAF (paroxysmal atrial fibrillation) ([place]) 06/22/2018   Post concussion syndrome 06/30/2016   Post-concussion headache 06/30/2016   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     atorvastatin (LIPITOR) 20 MG tablet TAKE 1 TABLET BY MOUTH EVERY DAY AT 6PM 90 tablet 3   buPROPion (WELLBUTRIN XL) 300 MG 24 hr tablet Take 300 mg by mouth daily.     clopidogrel (PLAVIX) 75 MG tablet Take 1 tablet (75 mg total) by mouth daily. 90 tablet 3   escitalopram (LEXAPRO) 5 MG tablet Take 10 mg by mouth daily.  0   NON FORMULARY Apply 50 mg topically daily. Testosterone 50mg /ml  0   propranolol ER (INDERAL LA) 80 MG 24 hr capsule Take 1 capsule (80 mg total) by mouth daily. 90 capsule 3   Current Facility-Administered Medications on File Prior to Visit  Medication Dose Route Frequency Provider Last Rate Last Admin   sodium chloride flush (NS) 0.9 % injection 10 mL  10 mL Intravenous PRN Shaun Runyon R, DO   20 mL at 03/20/20 1125    ALLERGIES: No Known Allergies  FAMILY HISTORY: Family History  Problem Relation Age of Onset   Lung cancer Mother    Diabetes Brother       Objective:  Blood pressure (!) 111/51, pulse 60, height 5\' 10"  (1.778 m), weight 157 lb (71.2 kg), SpO2 100 %. General: No acute distress.  Patient appears well-groomed.   Head:  Normocephalic/atraumatic Eyes:  Fundi examined but not visualized Neck: supple, no paraspinal tenderness, full range of motion Heart:  Regular rate and rhythm Lungs:  Clear to auscultation bilaterally Back: No paraspinal tenderness Neurological Exam: alert and oriented to person, place, and time.  Speech fluent and not dysarthric, language intact.  CN II-XII intact. Bulk and tone  normal, muscle strength 5/5 throughout.  Sensation to light touch intact.  Deep tendon reflexes 2+ throughout, toes downgoing.  Finger to nose testing with bilateral intention tremor.  Gait normal, Romberg negative.   Zachary Clines, DO  CC: Shirline Frees, MD

## 2021-10-20 ENCOUNTER — Other Ambulatory Visit: Payer: Self-pay

## 2021-10-20 ENCOUNTER — Encounter: Payer: Self-pay | Admitting: Neurology

## 2021-10-20 ENCOUNTER — Ambulatory Visit: Payer: Medicare PPO | Admitting: Neurology

## 2021-10-20 VITALS — BP 111/51 | HR 60 | Ht 70.0 in | Wt 157.0 lb

## 2021-10-20 DIAGNOSIS — I6521 Occlusion and stenosis of right carotid artery: Secondary | ICD-10-CM

## 2021-10-20 DIAGNOSIS — G459 Transient cerebral ischemic attack, unspecified: Secondary | ICD-10-CM

## 2021-10-20 DIAGNOSIS — E785 Hyperlipidemia, unspecified: Secondary | ICD-10-CM | POA: Diagnosis not present

## 2021-10-20 DIAGNOSIS — G250 Essential tremor: Secondary | ICD-10-CM | POA: Diagnosis not present

## 2021-10-22 DIAGNOSIS — F341 Dysthymic disorder: Secondary | ICD-10-CM | POA: Diagnosis not present

## 2021-10-26 DIAGNOSIS — F341 Dysthymic disorder: Secondary | ICD-10-CM | POA: Diagnosis not present

## 2021-10-29 DIAGNOSIS — F341 Dysthymic disorder: Secondary | ICD-10-CM | POA: Diagnosis not present

## 2021-11-02 DIAGNOSIS — F341 Dysthymic disorder: Secondary | ICD-10-CM | POA: Diagnosis not present

## 2021-11-05 DIAGNOSIS — F341 Dysthymic disorder: Secondary | ICD-10-CM | POA: Diagnosis not present

## 2021-11-09 DIAGNOSIS — F341 Dysthymic disorder: Secondary | ICD-10-CM | POA: Diagnosis not present

## 2021-11-12 DIAGNOSIS — F341 Dysthymic disorder: Secondary | ICD-10-CM | POA: Diagnosis not present

## 2021-11-16 DIAGNOSIS — F341 Dysthymic disorder: Secondary | ICD-10-CM | POA: Diagnosis not present

## 2021-11-19 DIAGNOSIS — F341 Dysthymic disorder: Secondary | ICD-10-CM | POA: Diagnosis not present

## 2021-11-23 DIAGNOSIS — F341 Dysthymic disorder: Secondary | ICD-10-CM | POA: Diagnosis not present

## 2021-11-26 DIAGNOSIS — F341 Dysthymic disorder: Secondary | ICD-10-CM | POA: Diagnosis not present

## 2021-11-30 ENCOUNTER — Ambulatory Visit (INDEPENDENT_AMBULATORY_CARE_PROVIDER_SITE_OTHER): Payer: Medicare PPO

## 2021-11-30 DIAGNOSIS — G459 Transient cerebral ischemic attack, unspecified: Secondary | ICD-10-CM

## 2021-11-30 DIAGNOSIS — F341 Dysthymic disorder: Secondary | ICD-10-CM | POA: Diagnosis not present

## 2021-12-01 LAB — CUP PACEART REMOTE DEVICE CHECK
Date Time Interrogation Session: 20230326230542
Implantable Pulse Generator Implant Date: 20210803

## 2021-12-02 ENCOUNTER — Ambulatory Visit: Payer: Medicare PPO | Admitting: Neurology

## 2021-12-03 DIAGNOSIS — F341 Dysthymic disorder: Secondary | ICD-10-CM | POA: Diagnosis not present

## 2021-12-09 DIAGNOSIS — F341 Dysthymic disorder: Secondary | ICD-10-CM | POA: Diagnosis not present

## 2021-12-10 DIAGNOSIS — F341 Dysthymic disorder: Secondary | ICD-10-CM | POA: Diagnosis not present

## 2021-12-11 NOTE — Progress Notes (Signed)
Carelink Summary Report / Loop Recorder 

## 2021-12-14 DIAGNOSIS — F341 Dysthymic disorder: Secondary | ICD-10-CM | POA: Diagnosis not present

## 2021-12-17 DIAGNOSIS — F341 Dysthymic disorder: Secondary | ICD-10-CM | POA: Diagnosis not present

## 2021-12-21 DIAGNOSIS — F341 Dysthymic disorder: Secondary | ICD-10-CM | POA: Diagnosis not present

## 2021-12-22 ENCOUNTER — Other Ambulatory Visit: Payer: Self-pay

## 2021-12-22 MED ORDER — ATORVASTATIN CALCIUM 20 MG PO TABS: 20.0000 mg | ORAL_TABLET | Freq: Every day | ORAL | 2 refills | Status: DC

## 2021-12-24 DIAGNOSIS — F341 Dysthymic disorder: Secondary | ICD-10-CM | POA: Diagnosis not present

## 2021-12-28 DIAGNOSIS — F341 Dysthymic disorder: Secondary | ICD-10-CM | POA: Diagnosis not present

## 2021-12-31 DIAGNOSIS — F341 Dysthymic disorder: Secondary | ICD-10-CM | POA: Diagnosis not present

## 2022-01-04 ENCOUNTER — Ambulatory Visit (INDEPENDENT_AMBULATORY_CARE_PROVIDER_SITE_OTHER): Payer: Medicare PPO

## 2022-01-04 DIAGNOSIS — G459 Transient cerebral ischemic attack, unspecified: Secondary | ICD-10-CM

## 2022-01-04 DIAGNOSIS — F341 Dysthymic disorder: Secondary | ICD-10-CM | POA: Diagnosis not present

## 2022-01-04 LAB — CUP PACEART REMOTE DEVICE CHECK
Date Time Interrogation Session: 20230428230325
Implantable Pulse Generator Implant Date: 20210803

## 2022-01-07 DIAGNOSIS — F341 Dysthymic disorder: Secondary | ICD-10-CM | POA: Diagnosis not present

## 2022-01-11 DIAGNOSIS — F341 Dysthymic disorder: Secondary | ICD-10-CM | POA: Diagnosis not present

## 2022-01-14 DIAGNOSIS — F341 Dysthymic disorder: Secondary | ICD-10-CM | POA: Diagnosis not present

## 2022-01-15 DIAGNOSIS — I25119 Atherosclerotic heart disease of native coronary artery with unspecified angina pectoris: Secondary | ICD-10-CM | POA: Diagnosis not present

## 2022-01-15 DIAGNOSIS — F419 Anxiety disorder, unspecified: Secondary | ICD-10-CM | POA: Diagnosis not present

## 2022-01-15 DIAGNOSIS — G250 Essential tremor: Secondary | ICD-10-CM | POA: Diagnosis not present

## 2022-01-15 DIAGNOSIS — M7022 Olecranon bursitis, left elbow: Secondary | ICD-10-CM | POA: Diagnosis not present

## 2022-01-15 DIAGNOSIS — E291 Testicular hypofunction: Secondary | ICD-10-CM | POA: Diagnosis not present

## 2022-01-15 DIAGNOSIS — F909 Attention-deficit hyperactivity disorder, unspecified type: Secondary | ICD-10-CM | POA: Diagnosis not present

## 2022-01-18 DIAGNOSIS — F341 Dysthymic disorder: Secondary | ICD-10-CM | POA: Diagnosis not present

## 2022-01-19 NOTE — Progress Notes (Signed)
Carelink Summary Report / Loop Recorder 

## 2022-01-21 DIAGNOSIS — F341 Dysthymic disorder: Secondary | ICD-10-CM | POA: Diagnosis not present

## 2022-01-25 DIAGNOSIS — F341 Dysthymic disorder: Secondary | ICD-10-CM | POA: Diagnosis not present

## 2022-01-28 DIAGNOSIS — F341 Dysthymic disorder: Secondary | ICD-10-CM | POA: Diagnosis not present

## 2022-02-01 DIAGNOSIS — F341 Dysthymic disorder: Secondary | ICD-10-CM | POA: Diagnosis not present

## 2022-02-03 ENCOUNTER — Ambulatory Visit (HOSPITAL_COMMUNITY)
Admission: RE | Admit: 2022-02-03 | Discharge: 2022-02-03 | Disposition: A | Discharge status: Home / Self Care | Payer: Medicare PPO | Source: Ambulatory Visit | Attending: Physician Assistant | Admitting: Physician Assistant

## 2022-02-03 ENCOUNTER — Ambulatory Visit: Payer: Medicare PPO | Admitting: Physician Assistant

## 2022-02-03 VITALS — BP 108/59 | HR 58 | Temp 98.2°F | Resp 20 | Ht 70.0 in | Wt 156.4 lb

## 2022-02-03 DIAGNOSIS — I6523 Occlusion and stenosis of bilateral carotid arteries: Secondary | ICD-10-CM | POA: Insufficient documentation

## 2022-02-03 NOTE — Progress Notes (Signed)
History of Present Illness:  Patient is a 71 y.o. year old male who presents for evaluation of carotid stenosis.   He has hx of combination CABG and left CEA with Dr. Alla German and Dr. Darrick Penna in July 2019.  Originally he had a scan that stated he had high grade stenosis B ICA.  On follow up the right ICA has been stable and the left ICA has had no evidence of restenosis.  He denies symptoms of stroke/TIA.  No amaurosis, weakness, or aphasia since his last visit.  He is medically managed on a daily Statin.   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   Bilateral carotid artery disease (HCC) 03/07/2018   Left CEA 03/08/18 at time of CABG    CAD (coronary artery disease) 03/03/2018   Cardiac cath  03/03/18 70% distal left main, 99% proximal LAD, 40-50% proximal circumflex, 60% intermediate, 90% ostial right coronary artery, 55% LVEF with some mild distal anterior hypokinesis   Depression 06/30/2016   Dyslipidemia 07/24/2015   The 10-year ASCVD risk score [name], et al., 2013) is: 15.7%   Values used to calculate the score:     Age: 20 years     Sex: Male     Is Non-Hispanic African American: No     Diabetic: No     Tobacco smoker: No     Systolic Blood Pressure: 114 mmHg     Is BP treated: No     HDL Cholesterol: 32 mg/dL     Total Cholesterol: 209 mg/dL The 16-XWRU ASCVD risk score [name], et al., 2013) is   Essential tremor    Generalized anxiety disorder 01/22/2016   History of traumatic head injury    Hyperlipidemia 03/03/2018   Hypogonadism in male 06/20/2018   Hypotension after procedure 06/22/2018   Memory loss 06/30/2016   On amiodarone therapy 06/22/2018   PAF (paroxysmal atrial fibrillation) (HCC) 06/22/2018   Post concussion syndrome 06/30/2016   Post-concussion headache 06/30/2016   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 LIMA TO LAD, SVG TO OM, SVG TO RAMUS, SVG TO PD.;  Surgeon: [name], MD;  Location: [facility] OR;  Service: Open Heart Surgery;  Laterality: N/A;   ENDARTERECTOMY Left 03/08/2018   Procedure: ENDARTERECTOMY CAROTID;  Surgeon: [name], MD;  Location: [facility] OR;  Service: Vascular;  Laterality: Left;   KNEE SURGERY     LEFT HEART CATH AND CORONARY ANGIOGRAPHY N/A 03/03/2018   Procedure: LEFT HEART CATH AND CORONARY ANGIOGRAPHY;  Surgeon: [name], MD;  Location: [facility] INVASIVE CV LAB;  Service: Cardiovascular;  Laterality: N/A;   TEE WITHOUT CARDIOVERSION N/A 03/08/2018   Procedure: TRANSESOPHAGEAL ECHOCARDIOGRAM (TEE);  Surgeon: [name], [name], MD;  Location: [facility] OR;  Service: Open Heart Surgery;  Laterality: N/A;   TONSILLECTOMY       Social History Social History   Tobacco Use   Smoking status: Never   Smokeless tobacco: Never  Vaping Use   Vaping Use: Never used  Substance Use Topics   Alcohol use: Not Currently   Drug use: Never    Family History Family History  Problem Relation Age  of Onset   Lung cancer Mother    Diabetes Brother     Allergies  No Known Allergies   Current Outpatient Medications  Medication Sig Dispense Refill   atorvastatin (LIPITOR) 20 MG tablet Take 1 tablet (20 mg total) by mouth daily. 90 tablet 2   buPROPion (WELLBUTRIN XL) 300 MG 24 hr tablet Take 300 mg by mouth daily.     clopidogrel (PLAVIX) 75 MG tablet Take 1 tablet (75 mg total) by mouth daily. 90 tablet 3   escitalopram (LEXAPRO) 5 MG tablet Take 10 mg by mouth daily.  0   lisdexamfetamine (VYVANSE) 40 MG capsule      NON FORMULARY Apply 50 mg topically daily. Testosterone 50mg /ml  0   propranolol ER (INDERAL LA) 80 MG 24 hr capsule Take 1 capsule (80 mg total) by mouth daily. 90 capsule 3   No  current facility-administered medications for this visit.   Facility-Administered Medications Ordered in Other Visits  Medication Dose Route Frequency Provider Last Rate Last Admin   sodium chloride flush (NS) 0.9 % injection 10 mL  10 mL Intravenous PRN Jaffe, Adam R, DO   20 mL at 03/20/20 1125    ROS:   General:  No weight loss, Fever, chills  HEENT: No recent headaches, no nasal bleeding, no visual changes, no sore throat  Neurologic: No dizziness, blackouts, seizures. No recent symptoms of stroke or mini- stroke. No recent episodes of slurred speech, or temporary blindness.  Cardiac: No recent episodes of chest pain/pressure, no shortness of breath at rest.  No shortness of breath with exertion.  Denies history of atrial fibrillation or irregular heartbeat  Vascular: No history of rest pain in feet.  No history of claudication.  No history of non-healing ulcer, No history of DVT   Pulmonary: No home oxygen, no productive cough, no hemoptysis,  No asthma or wheezing  Musculoskeletal:  [ ]  Arthritis, [ ]  Low back pain,  [ ]  Joint pain  Hematologic:No history of hypercoagulable state.  No history of easy bleeding.  No history of anemia  Gastrointestinal: No hematochezia or melena,  No gastroesophageal reflux, no trouble swallowing  Urinary: [ ]  chronic Kidney disease, [ ]  on HD - [ ]  MWF or [ ]  TTHS, [ ]  Burning with urination, [ ]  Frequent urination, [ ]  Difficulty urinating;   Skin: No rashes  Psychological: No history of anxiety,  No history of depression   Physical Examination  Vitals:   02/03/22 1430 02/03/22 1431  BP: 120/63 (!) 108/59  Pulse: (!) 58   Resp: 20   Temp: 98.2 F (36.8 C)   TempSrc: Temporal   SpO2: 97%   Weight: 156 lb 6.4 oz (70.9 kg)   Height: 5\' 10"  (1.778 m)     Body mass index is 22.44 kg/m.  General:  Alert and oriented, no acute distress HEENT: Normal Neck: positive right bruit or JVD Pulmonary: Clear to auscultation  bilaterally Cardiac: Regular Rate and Rhythm without murmur Gastrointestinal: Soft, non-tender, non-distended, no mass, no scars Skin: No rash Extremity Pulses:  2+ radial pulses bilaterally Musculoskeletal: No deformity or edema  Neurologic: Upper and lower extremity motor 5/5 and symmetric  DATA:   Right Carotid Findings:  +----------+--------+--------+--------+--------------------------+--------+             PSV cm/sEDV cm/sStenosisPlaque Description          Comments  +----------+--------+--------+--------+--------------------------+--------+   CCA Prox  98      0                                                     +----------+--------+--------+--------+--------------------------+--------+  CCA Mid   107     14                                                    +----------+--------+--------+--------+--------------------------+--------+   CCA Distal86      13                                                    +----------+--------+--------+--------+--------------------------+--------+   ICA Prox  305     66      60-79%  irregular and heterogenous            +----------+--------+--------+--------+--------------------------+--------+   ICA Mid   171     37                                                    +----------+--------+--------+--------+--------------------------+--------+   ICA Distal143     17                                                    +----------+--------+--------+--------+--------------------------+--------+   ECA       160     0                                                     +----------+--------+--------+--------+--------------------------+--------+    +----------+--------+-------+----------------+-------------------+            PSV cm/sEDV cmsDescribe        Arm Pressure (mmHG)  +----------+--------+-------+----------------+-------------------+  [id number]      0       Multiphasic, WNL                     +----------+--------+-------+----------------+-------------------+   +---------+--------+--+--------+-+---------+  VertebralPSV cm/s63EDV cm/s8Antegrade  +---------+--------+--+--------+-+---------+       Left Carotid Findings:  +----------+--------+--------+--------+------------------+--------+            PSV cm/sEDV cm/sStenosisPlaque DescriptionComments  +----------+--------+--------+--------+------------------+--------+  CCA Prox  113     15                                          +----------+--------+--------+--------+------------------+--------+  CCA Mid   107     16                                          +----------+--------+--------+--------+------------------+--------+  CCA Distal72      0                                           +----------+--------+--------+--------+------------------+--------+  ICA Prox  62      16      1-39%   diffuse                     +----------+--------+--------+--------+------------------+--------+  ICA Mid   95      20                                          +----------+--------+--------+--------+------------------+--------+  ICA Distal101     22                                          +----------+--------+--------+--------+------------------+--------+  ECA       152     0       >50%    irregular                   +----------+--------+--------+--------+------------------+--------+   +----------+--------+--------+----------------+-------------------+            PSV cm/sEDV cm/sDescribe        Arm Pressure (mmHG)  +----------+--------+--------+----------------+-------------------+  Subclavian132     0       Multiphasic, WNL                     +----------+--------+--------+----------------+-------------------+   +---------+--------+--+--------+--+---------+  VertebralPSV cm/s95EDV cm/s18Antegrade   +---------+--------+--+--------+--+---------+    Summary:  Right Carotid: Velocities in the right ICA are consistent with a 60-79%                 stenosis.   Left Carotid: Velocities in the left ICA are consistent with a 1-39%  stenosis.                The ECA appears >50% stenosed.   Vertebrals:  Bilateral vertebral arteries demonstrate antegrade flow.  Subclavians: Normal flow hemodynamics were seen in bilateral subclavian               arteries.    ASSESSMENT/PLAN: Asymptomatic high grade stenosis left ICA s/p left CEA CABG combo 2019. He denies new symptoms of stroke/TIA.  His duplex demonstrates no re stenosis on the left ICA and stable right ICA stenosis 60-79%.     He will stay active, continue to take daily Stain for medical management and f/u in 6 months for surveillance of ICA stenosis.  If he has symptoms of stroke he will call 911.      Fabienne Bruns, MD [facility] Office: [phone] Pager: [phone]

## 2022-02-04 DIAGNOSIS — F341 Dysthymic disorder: Secondary | ICD-10-CM | POA: Diagnosis not present

## 2022-02-08 ENCOUNTER — Ambulatory Visit (INDEPENDENT_AMBULATORY_CARE_PROVIDER_SITE_OTHER): Payer: Medicare PPO

## 2022-02-08 DIAGNOSIS — G459 Transient cerebral ischemic attack, unspecified: Secondary | ICD-10-CM | POA: Diagnosis not present

## 2022-02-08 DIAGNOSIS — F341 Dysthymic disorder: Secondary | ICD-10-CM | POA: Diagnosis not present

## 2022-02-08 LAB — CUP PACEART REMOTE DEVICE CHECK
Date Time Interrogation Session: 20230531230435
Implantable Pulse Generator Implant Date: 20210803

## 2022-02-09 ENCOUNTER — Other Ambulatory Visit: Payer: Self-pay | Admitting: *Deleted

## 2022-02-09 DIAGNOSIS — I6523 Occlusion and stenosis of bilateral carotid arteries: Secondary | ICD-10-CM

## 2022-02-11 DIAGNOSIS — F341 Dysthymic disorder: Secondary | ICD-10-CM | POA: Diagnosis not present

## 2022-02-15 DIAGNOSIS — F341 Dysthymic disorder: Secondary | ICD-10-CM | POA: Diagnosis not present

## 2022-02-17 DIAGNOSIS — F341 Dysthymic disorder: Secondary | ICD-10-CM | POA: Diagnosis not present

## 2022-02-22 DIAGNOSIS — F341 Dysthymic disorder: Secondary | ICD-10-CM | POA: Diagnosis not present

## 2022-02-25 DIAGNOSIS — F341 Dysthymic disorder: Secondary | ICD-10-CM | POA: Diagnosis not present

## 2022-02-25 NOTE — Progress Notes (Signed)
Carelink Summary Report / Loop Recorder 

## 2022-02-26 DIAGNOSIS — Z1389 Encounter for screening for other disorder: Secondary | ICD-10-CM | POA: Diagnosis not present

## 2022-02-26 DIAGNOSIS — Z832 Family history of diseases of the blood and blood-forming organs and certain disorders involving the immune mechanism: Secondary | ICD-10-CM | POA: Diagnosis not present

## 2022-02-26 DIAGNOSIS — Z1329 Encounter for screening for other suspected endocrine disorder: Secondary | ICD-10-CM | POA: Diagnosis not present

## 2022-02-26 DIAGNOSIS — Z0000 Encounter for general adult medical examination without abnormal findings: Secondary | ICD-10-CM | POA: Diagnosis not present

## 2022-02-26 DIAGNOSIS — Z13220 Encounter for screening for lipoid disorders: Secondary | ICD-10-CM | POA: Diagnosis not present

## 2022-03-01 DIAGNOSIS — F341 Dysthymic disorder: Secondary | ICD-10-CM | POA: Diagnosis not present

## 2022-03-05 DIAGNOSIS — F341 Dysthymic disorder: Secondary | ICD-10-CM | POA: Diagnosis not present

## 2022-03-09 DIAGNOSIS — F341 Dysthymic disorder: Secondary | ICD-10-CM | POA: Diagnosis not present

## 2022-03-11 DIAGNOSIS — F341 Dysthymic disorder: Secondary | ICD-10-CM | POA: Diagnosis not present

## 2022-03-15 ENCOUNTER — Ambulatory Visit (INDEPENDENT_AMBULATORY_CARE_PROVIDER_SITE_OTHER): Payer: Medicare PPO

## 2022-03-15 DIAGNOSIS — G459 Transient cerebral ischemic attack, unspecified: Secondary | ICD-10-CM

## 2022-03-15 DIAGNOSIS — F341 Dysthymic disorder: Secondary | ICD-10-CM | POA: Diagnosis not present

## 2022-03-15 LAB — CUP PACEART REMOTE DEVICE CHECK
Date Time Interrogation Session: 20230703231208
Implantable Pulse Generator Implant Date: 20210803

## 2022-03-17 DIAGNOSIS — F341 Dysthymic disorder: Secondary | ICD-10-CM | POA: Diagnosis not present

## 2022-03-22 DIAGNOSIS — F341 Dysthymic disorder: Secondary | ICD-10-CM | POA: Diagnosis not present

## 2022-03-25 DIAGNOSIS — F341 Dysthymic disorder: Secondary | ICD-10-CM | POA: Diagnosis not present

## 2022-03-29 DIAGNOSIS — F341 Dysthymic disorder: Secondary | ICD-10-CM | POA: Diagnosis not present

## 2022-04-01 DIAGNOSIS — F341 Dysthymic disorder: Secondary | ICD-10-CM | POA: Diagnosis not present

## 2022-04-05 DIAGNOSIS — F341 Dysthymic disorder: Secondary | ICD-10-CM | POA: Diagnosis not present

## 2022-04-07 ENCOUNTER — Encounter: Payer: Self-pay | Admitting: Cardiology

## 2022-04-07 ENCOUNTER — Ambulatory Visit: Payer: Medicare PPO | Admitting: Cardiology

## 2022-04-07 VITALS — BP 118/54 | HR 56 | Ht 70.0 in | Wt 158.0 lb

## 2022-04-07 DIAGNOSIS — Z951 Presence of aortocoronary bypass graft: Secondary | ICD-10-CM | POA: Diagnosis not present

## 2022-04-07 DIAGNOSIS — I480 Paroxysmal atrial fibrillation: Secondary | ICD-10-CM | POA: Diagnosis not present

## 2022-04-07 DIAGNOSIS — I2510 Atherosclerotic heart disease of native coronary artery without angina pectoris: Secondary | ICD-10-CM | POA: Diagnosis not present

## 2022-04-07 DIAGNOSIS — E782 Mixed hyperlipidemia: Secondary | ICD-10-CM

## 2022-04-07 NOTE — Patient Instructions (Signed)
Medication Instructions:  Your physician recommends that you continue on your current medications as directed. Please refer to the Current Medication list given to you today.  *If you need a refill on your cardiac medications before your next appointment, please call your pharmacy*   Lab Work: Your physician recommends that you return for lab work in: Lipid profile today fasting  If you have labs (blood work) drawn today and your tests are completely normal, you will receive your results only by: MyChart Message (if you have MyChart) OR A paper copy in the mail If you have any lab test that is abnormal or we need to change your treatment, we will call you to review the results.   Testing/Procedures: None    Follow-Up: At [facility], you and your health needs are our priority.  As part of our continuing mission to provide you with exceptional heart care, we have created designated Provider Care Teams.  These Care Teams include your primary Cardiologist (physician) and Advanced Practice Providers (APPs -  Physician Assistants and Nurse Practitioners) who all work together to provide you with the care you need, when you need it.  We recommend signing up for the patient portal called "MyChart".  Sign up information is provided on this After Visit Summary.  MyChart is used to connect with patients for Virtual Visits (Telemedicine).  Patients are able to view lab/test results, encounter notes, upcoming appointments, etc.  Non-urgent messages can be sent to your provider as well.   To learn more about what you can do with MyChart, go to [email or web address].    Your next appointment:   6 month(s)  The format for your next appointment:   In Person  Provider:   [name], MD    Other Instructions   Important Information About Sugar

## 2022-04-07 NOTE — Progress Notes (Unsigned)
Cardiology Office Note:    Date:  04/07/2022   ID:  Zachary Ponce, DOB 1951/05/27, MRN [id number]  PCP:  Zachary Blamer, MD  Cardiologist:  Zachary Balsam, MD    Referring MD: Zachary Blamer, MD   Chief Complaint  Patient presents with   Follow-up    History of Present Illness:    [name] is a 71 y.o. male with past medical history significant for coronary artery disease, status post coronary bypass graft in 2019, carotic arterial disease with up to 79% stenosis, that being followed by vascular surgeon in [place], history of TIA, he does have implantable loop recorder, essential hypertension, dyslipidemia, bradycardia. Comes today 2 months of follow-up overall seems to be doing well.  Denies have any chest pain tightness squeezing pressure burning chest.  Described to have some fatigue tiredness when he is trying to walk outside and is very hot.  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   Bilateral carotid artery disease (HCC) 03/07/2018   Left CEA 03/08/18 at time of CABG    CAD (coronary artery disease) 03/03/2018   Cardiac cath  03/03/18 70% distal left main, 99% proximal LAD, 40-50% proximal circumflex, 60% intermediate, 90% ostial right coronary artery, 55% LVEF with some mild distal anterior hypokinesis   Depression 06/30/2016   Dyslipidemia 07/24/2015   The 10-year ASCVD risk score Zachary George DC Jr., et al., 2013) is: 15.7%   Values used to calculate the score:     Age: 36 years     Sex: Male     Is Non-Hispanic African American: No     Diabetic: No     Tobacco smoker: No     Systolic Blood Pressure: 114 mmHg     Is BP treated: No     HDL Cholesterol: 32 mg/dL     Total Cholesterol: 209 mg/dL The 04-UGQB ASCVD risk score Zachary George DC Jr., et al., 2013) is   Essential tremor    Generalized anxiety disorder 01/22/2016   History of traumatic head injury    Hyperlipidemia 03/03/2018    Hypogonadism in male 06/20/2018   Hypotension after procedure 06/22/2018   Memory loss 06/30/2016   On amiodarone therapy 06/22/2018   PAF (paroxysmal atrial fibrillation) (HCC) 06/22/2018   Post concussion syndrome 06/30/2016   Post-concussion headache 06/30/2016   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 LIMA TO LAD, SVG TO OM, SVG TO RAMUS, SVG TO PD.;  Surgeon: Zachary Perna, MD;  Location: [facility] OR;  Service: Open Heart Surgery;  Laterality: N/A;   ENDARTERECTOMY Left 03/08/2018   Procedure: ENDARTERECTOMY CAROTID;  Surgeon: Zachary Kerns, MD;  [facility];  Service: Vascular;  Laterality: Left;   KNEE SURGERY     LEFT HEART CATH AND CORONARY ANGIOGRAPHY N/A 03/03/2018   Procedure: LEFT HEART CATH AND CORONARY ANGIOGRAPHY;  Surgeon: Zachary Bihari, MD;  Location: [facility] INVASIVE CV LAB;  Service: Cardiovascular;  Laterality: N/A;   TEE WITHOUT CARDIOVERSION N/A 03/08/2018   Procedure: TRANSESOPHAGEAL ECHOCARDIOGRAM (TEE);  Surgeon: Zachary Ponce, Zachary Arista, MD;  Location: [facility];  Service: Open Heart Surgery;  Laterality: N/A;   TONSILLECTOMY      Current Medications:  Current Meds  Medication Sig   atorvastatin (LIPITOR) 20 MG tablet Take 1 tablet (20 mg total) by mouth daily.   buPROPion (WELLBUTRIN XL) 300 MG 24 hr tablet Take 300 mg by mouth daily.   clopidogrel (PLAVIX) 75 MG tablet Take 1 tablet (75 mg total) by mouth daily.   escitalopram (LEXAPRO) 5 MG tablet Take 10 mg by mouth daily.   lisdexamfetamine (VYVANSE) 40 MG capsule Take 40 mg by mouth every morning.   NON FORMULARY Apply 50 mg topically daily. Testosterone 50mg /ml   propranolol ER (INDERAL LA) 80 MG 24 hr capsule Take 1 capsule (80 mg total) by mouth daily.     Allergies:   Patient has no  known allergies.   Social History   Socioeconomic History   Marital status: Married    Spouse name: Not on file   Number of children: Not on file   Years of education: Not on file   Highest education level: Not on file  Occupational History   Not on file  Tobacco Use   Smoking status: Never   Smokeless tobacco: Never  Vaping Use   Vaping Use: Never used  Substance and Sexual Activity   Alcohol use: Not Currently   Drug use: Never   Sexual activity: Yes  Other Topics Concern   Not on file  Social History Narrative   Left handed   3 story home   Drink decaffaine   Social Determinants of Health   Financial Resource Strain: Not on file  Food Insecurity: No Food Insecurity (07/20/2021)   Hunger Vital Sign    Worried About Running Out of Food in the Last Year: Never true    Ran Out of Food in the Last Year: Never true  Transportation Needs: Not on file  Physical Activity: Not on file  Stress: Not on file  Social Connections: Not on file     Family History: The patient's family history includes Diabetes in his brother; Lung cancer in his mother. ROS:   Please see the history of present illness.    All 14 point review of systems negative except as described per history of present illness  EKGs/Labs/Other Studies Reviewed:      Recent Labs: No results found for requested labs within last 365 days.  Recent Lipid Panel    Component Value Date/Time   CHOL 116 09/02/2020 0821   TRIG 104 09/02/2020 0821   HDL 37 (L) 09/02/2020 0821   CHOLHDL 3.1 09/02/2020 0821   LDLCALC 60 09/02/2020 0821    Physical Exam:    VS:  BP (!) 118/54 (BP Location: Left Arm, Patient Position: Sitting)   Pulse (!) 56   Ht 5\' 10"  (1.778 m)   Wt 158 lb (71.7 kg)   SpO2 97%   BMI 22.67 kg/m     Wt Readings from Last 3 Encounters:  04/07/22 158 lb (71.7 kg)  02/03/22 156 lb 6.4 oz (70.9 kg)  10/20/21 157 lb (71.2 kg)     GEN:  Well nourished, well developed in no acute  distress HEENT: Normal NECK: No JVD; No carotid bruits LYMPHATICS: No lymphadenopathy CARDIAC: RRR, no murmurs, no rubs, no gallops RESPIRATORY:  Clear to auscultation without rales, wheezing or rhonchi  ABDOMEN: Soft, non-tender, non-distended MUSCULOSKELETAL:  No edema; No deformity  SKIN: Warm and dry LOWER EXTREMITIES: no swelling NEUROLOGIC:  Alert and oriented x 3 PSYCHIATRIC:  Normal affect   ASSESSMENT:    1. Coronary artery disease involving native coronary artery  of native heart without angina pectoris   2. PAF (paroxysmal atrial fibrillation) (HCC)   3. S/P CABG x 4   4. Mixed hyperlipidemia    PLAN:    In order of problems listed above:  Coronary artery disease stable from that point review and appropriate medications which I will continue. He does have implantable loop recorder looking for paroxysmal atrial fibrillation which so far we are unable to identified.  The reason for that is the fact that he did have some TIA.  In the meantime he is on antiplatelet therapy and statin. Dyslipidemia I will ask him to have fasting lipid profile done today. Tremor that being followed by neurology.  He is on small dose of Inderal he is not convinced if it is helping likely he does not have any signs and symptoms of significant bradycardia, no dizziness no passing out   Medication Adjustments/Labs and Tests Ordered: Current medicines are reviewed at length with the patient today.  Concerns regarding medicines are outlined above.  No orders of the defined types were placed in this encounter.  Medication changes: No orders of the defined types were placed in this encounter.   Signed, Georgeanna Lea, MD, [facility] 04/07/2022 3:24 PM    [facility]

## 2022-04-08 DIAGNOSIS — F341 Dysthymic disorder: Secondary | ICD-10-CM | POA: Diagnosis not present

## 2022-04-08 LAB — LIPID PANEL
Chol/HDL Ratio: 2.9 ratio (ref 0.0–5.0)
Cholesterol, Total: 112 mg/dL (ref 100–199)
HDL: 39 mg/dL — ABNORMAL LOW (ref >39)
LDL Chol Calc (NIH): 51 mg/dL (ref 0–99)
Triglycerides: 120 mg/dL (ref 0–149)
VLDL Cholesterol Cal: 22 mg/dL (ref 5–40)

## 2022-04-12 DIAGNOSIS — F341 Dysthymic disorder: Secondary | ICD-10-CM | POA: Diagnosis not present

## 2022-04-12 NOTE — Progress Notes (Signed)
Carelink Summary Report / Loop Recorder 

## 2022-04-13 ENCOUNTER — Telehealth: Payer: Self-pay

## 2022-04-13 NOTE — Telephone Encounter (Signed)
-----   Message from Georgeanna Lea, MD sent at 04/12/2022  9:34 AM EDT ----- Cholesterol perfect, continue present management

## 2022-04-13 NOTE — Telephone Encounter (Signed)
Patient notified of results.

## 2022-04-15 DIAGNOSIS — F341 Dysthymic disorder: Secondary | ICD-10-CM | POA: Diagnosis not present

## 2022-04-15 LAB — CUP PACEART REMOTE DEVICE CHECK
Date Time Interrogation Session: 20230805230821
Implantable Pulse Generator Implant Date: 20210803

## 2022-04-19 ENCOUNTER — Ambulatory Visit: Payer: Medicare PPO

## 2022-04-19 DIAGNOSIS — F341 Dysthymic disorder: Secondary | ICD-10-CM | POA: Diagnosis not present

## 2022-04-22 DIAGNOSIS — F341 Dysthymic disorder: Secondary | ICD-10-CM | POA: Diagnosis not present

## 2022-04-26 DIAGNOSIS — F341 Dysthymic disorder: Secondary | ICD-10-CM | POA: Diagnosis not present

## 2022-04-27 ENCOUNTER — Other Ambulatory Visit: Payer: Self-pay | Admitting: Cardiology

## 2022-04-29 DIAGNOSIS — F341 Dysthymic disorder: Secondary | ICD-10-CM | POA: Diagnosis not present

## 2022-05-03 DIAGNOSIS — F341 Dysthymic disorder: Secondary | ICD-10-CM | POA: Diagnosis not present

## 2022-05-06 DIAGNOSIS — F341 Dysthymic disorder: Secondary | ICD-10-CM | POA: Diagnosis not present

## 2022-05-10 DIAGNOSIS — F341 Dysthymic disorder: Secondary | ICD-10-CM | POA: Diagnosis not present

## 2022-05-13 DIAGNOSIS — F341 Dysthymic disorder: Secondary | ICD-10-CM | POA: Diagnosis not present

## 2022-05-17 DIAGNOSIS — F341 Dysthymic disorder: Secondary | ICD-10-CM | POA: Diagnosis not present

## 2022-05-20 DIAGNOSIS — F341 Dysthymic disorder: Secondary | ICD-10-CM | POA: Diagnosis not present

## 2022-05-20 DIAGNOSIS — H43813 Vitreous degeneration, bilateral: Secondary | ICD-10-CM | POA: Diagnosis not present

## 2022-05-24 ENCOUNTER — Ambulatory Visit (INDEPENDENT_AMBULATORY_CARE_PROVIDER_SITE_OTHER): Payer: Medicare PPO

## 2022-05-24 DIAGNOSIS — G459 Transient cerebral ischemic attack, unspecified: Secondary | ICD-10-CM

## 2022-05-24 DIAGNOSIS — F900 Attention-deficit hyperactivity disorder, predominantly inattentive type: Secondary | ICD-10-CM | POA: Diagnosis not present

## 2022-05-24 DIAGNOSIS — F341 Dysthymic disorder: Secondary | ICD-10-CM | POA: Diagnosis not present

## 2022-05-25 LAB — CUP PACEART REMOTE DEVICE CHECK
Date Time Interrogation Session: 20230917230928
Implantable Pulse Generator Implant Date: 20210803

## 2022-05-27 DIAGNOSIS — F341 Dysthymic disorder: Secondary | ICD-10-CM | POA: Diagnosis not present

## 2022-05-31 DIAGNOSIS — F341 Dysthymic disorder: Secondary | ICD-10-CM | POA: Diagnosis not present

## 2022-06-03 DIAGNOSIS — F341 Dysthymic disorder: Secondary | ICD-10-CM | POA: Diagnosis not present

## 2022-06-04 DIAGNOSIS — N5202 Corporo-venous occlusive erectile dysfunction: Secondary | ICD-10-CM | POA: Diagnosis not present

## 2022-06-04 DIAGNOSIS — R948 Abnormal results of function studies of other organs and systems: Secondary | ICD-10-CM | POA: Diagnosis not present

## 2022-06-04 DIAGNOSIS — E291 Testicular hypofunction: Secondary | ICD-10-CM | POA: Diagnosis not present

## 2022-06-07 DIAGNOSIS — F341 Dysthymic disorder: Secondary | ICD-10-CM | POA: Diagnosis not present

## 2022-06-08 NOTE — Progress Notes (Signed)
Carelink Summary Report / Loop Recorder 

## 2022-06-10 DIAGNOSIS — F341 Dysthymic disorder: Secondary | ICD-10-CM | POA: Diagnosis not present

## 2022-06-14 DIAGNOSIS — F341 Dysthymic disorder: Secondary | ICD-10-CM | POA: Diagnosis not present

## 2022-06-17 DIAGNOSIS — F341 Dysthymic disorder: Secondary | ICD-10-CM | POA: Diagnosis not present

## 2022-06-21 DIAGNOSIS — F341 Dysthymic disorder: Secondary | ICD-10-CM | POA: Diagnosis not present

## 2022-06-24 DIAGNOSIS — F341 Dysthymic disorder: Secondary | ICD-10-CM | POA: Diagnosis not present

## 2022-06-28 ENCOUNTER — Ambulatory Visit (INDEPENDENT_AMBULATORY_CARE_PROVIDER_SITE_OTHER): Payer: Medicare PPO

## 2022-06-28 DIAGNOSIS — G459 Transient cerebral ischemic attack, unspecified: Secondary | ICD-10-CM

## 2022-06-28 DIAGNOSIS — F341 Dysthymic disorder: Secondary | ICD-10-CM | POA: Diagnosis not present

## 2022-06-29 LAB — CUP PACEART REMOTE DEVICE CHECK
Date Time Interrogation Session: 20231022231311
Implantable Pulse Generator Implant Date: 20210803

## 2022-07-01 DIAGNOSIS — F341 Dysthymic disorder: Secondary | ICD-10-CM | POA: Diagnosis not present

## 2022-07-05 DIAGNOSIS — F341 Dysthymic disorder: Secondary | ICD-10-CM | POA: Diagnosis not present

## 2022-07-08 DIAGNOSIS — F341 Dysthymic disorder: Secondary | ICD-10-CM | POA: Diagnosis not present

## 2022-07-13 DIAGNOSIS — F341 Dysthymic disorder: Secondary | ICD-10-CM | POA: Diagnosis not present

## 2022-07-15 DIAGNOSIS — F341 Dysthymic disorder: Secondary | ICD-10-CM | POA: Diagnosis not present

## 2022-07-19 DIAGNOSIS — F341 Dysthymic disorder: Secondary | ICD-10-CM | POA: Diagnosis not present

## 2022-07-19 NOTE — Progress Notes (Unsigned)
NEUROLOGY FOLLOW UP OFFICE NOTE  Zachary Ponce [id number]  Assessment/Plan:   Essential tremor  Right carotid artery stenosis  Dyslipidemia History of transient ischemic attack   1.For tremor:  continue propranolol ER 80mg  daily.  We will try lamotrigine 25mg  daily for 2 weeks, then increase to 25mg  twice daily. 2.Secondary stroke prevention as managed by PCP: Plavix 75mg  daily -  Statin therapy.  LDL goal less than 70 -  Glycemic control.  Hgb A1c goal less than 7 -  Normotensive blood pressure 3. Carotid stenosis monitored by vascular surgery 4. A fib monitoring with loop recorder 5. Follow up 4 to 5 months.   Subjective:  Zachary Ponce is a 71 year old ambidextrous Caucasian male with CAD s/p coronary artery bypass grafting, left carotid artery disease s/p CEA, HLD, depression and anxiety, essential tremor and history of traumatic brain injury and TIA who follows up for essential tremor.   UPDATE: Current medications:  Plavix 75mg  daily, atorvastatin 20mg  daily, propranolol ER 80mg  daily (higher doses caused hypotension)  He was advised to retry primidone which continued to cause nightmares.  In August he was started on topiramate.  However, it made him feel "wired". He contacted his cardiologist and requested to go back on propranolol.  However, it has not been helpful for the last 3 weeks.  May be aggravated by stress.  Difficulty holding objects, using utensils, writing, using his cell phone.  He is noticing it in his right hand more as well.     He is with implantable loop recorder.  No atrial fibrillation has yet been identified.  Carotid ultrasound in May revealed 60-79% right ICA stenosis, 1-39% in left ICA.     HISTORY:  In late April 2021, he had an episode of dizziness, nausea, vomiting.  He was laying on the bed with his right arm laying over the side and couldn't move it for about 5 minutes.  EMS was called and blood pressure was reportedly 160 systolic and  EKG reportedly normal.     Carotid ultrasound on 01/16/2020 showed 60-79% right ICA stenosis and 1-39% left ICA stenosis.  MRI of brain without contrast on 01/18/2020 personally reviewed showed mild chronic small vessel ischemic changes and small remote left frontal infact but no acute intracranial abnormality.     CTA of head and neck from 03/17/2020 personally reviewed showed moderate M1 MCA stenoses bilaterally and 65-70% proximal right ICA stenosis and moderate stenosis at origin of the dominant right vertebral artery.  2D echocardiogram on 03/20/2020 showed EF 60-65% with no cardiac source of embolus.  Carotid ultrasound on 01/28/2021 showed right ICA 60-79% stenosis, no hemodynamically significant stenosis of left ICA   In 2019, he underwent left CEA for carotid artery stenosis and coronary bypass grafting.  Following procedure, he did have episodes of atrial fibrillation and was on Eliquis for a period of time.    He also has essential tremor.  Primidone caused nightmares.  Topiramate made him feel "wired".   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   Bilateral carotid artery disease (HCC) 03/07/2018   Left CEA 03/08/18 at time of CABG    CAD (coronary artery disease) 03/03/2018   Cardiac cath  03/03/18 70% distal left main, 99% proximal LAD, 40-50% proximal circumflex, 60% intermediate, 90% ostial right coronary artery, 55% LVEF with some mild distal anterior hypokinesis   Depression 06/30/2016  Dyslipidemia 07/24/2015   The 10-year ASCVD risk score [name], et al., 2013) is: 15.7%   Values used to calculate the score:     Age: 82 years     Sex: Male     Is Non-Hispanic African American: No     Diabetic: No     Tobacco smoker: No     Systolic Blood Pressure: 114 mmHg     Is BP treated: No     HDL Cholesterol: 32 mg/dL     Total Cholesterol: 209 mg/dL The 43-XVQM ASCVD risk score  [name], et al., 2013) is   Essential tremor    Generalized anxiety disorder 01/22/2016   History of traumatic head injury    Hyperlipidemia 03/03/2018   Hypogonadism in male 06/20/2018   Hypotension after procedure 06/22/2018   Memory loss 06/30/2016   On amiodarone therapy 06/22/2018   PAF (paroxysmal atrial fibrillation) (HCC) 06/22/2018   Post concussion syndrome 06/30/2016   Post-concussion headache 06/30/2016   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 90 tablet 2   buPROPion (WELLBUTRIN XL) 300 MG 24 hr tablet Take 300 mg by mouth daily.     clopidogrel (PLAVIX) 75 MG tablet TAKE 1 TABLET BY MOUTH EVERY DAY 90 tablet 2   escitalopram (LEXAPRO) 5 MG tablet Take 10 mg by mouth daily.  0   lisdexamfetamine (VYVANSE) 40 MG capsule Take 40 mg by mouth every morning.     NON FORMULARY Apply 50 mg topically daily. Testosterone 50mg /ml  0   propranolol ER (INDERAL LA) 80 MG 24 hr capsule Take 1 capsule (80 mg total) by mouth daily. 90 capsule 3   Current Facility-Administered Medications on File Prior to Visit  Medication Dose Route Frequency Provider Last Rate Last Admin   sodium chloride flush (NS) 0.9 % injection 10 mL  10 mL Intravenous PRN , Rande Roylance R, DO   20 mL at 03/20/20 1125    ALLERGIES: No Known Allergies  FAMILY HISTORY: Family History  Problem Relation Age of Onset   Lung cancer Mother    Diabetes Brother       Objective:  Blood pressure 133/67, pulse 73, height 5\' 10"  (1.778 m), weight 155 lb 9.6 oz (70.6 kg), SpO2 100 %. General: No acute distress.  Patient appears well-groomed.   Head:  Normocephalic/atraumatic Eyes:  Fundi examined but not visualized Neck: supple, no paraspinal tenderness, full range of motion Heart:  Regular rate and  rhythm Neurological Exam: alert and oriented to person, place, and time.  Speech fluent and not dysarthric, language intact.  CN II-XII intact. Bulk and tone normal, muscle strength 5/5 throughout.  Sensation to light touch intact.  Deep tendon reflexes 2+ throughout.  Finger to nose testing demonstrates bilateral kinetic tremor.  Gait normal, Romberg negative.   03/22/20, DO  CC: , MD

## 2022-07-20 ENCOUNTER — Ambulatory Visit: Payer: Medicare PPO | Admitting: Neurology

## 2022-07-20 ENCOUNTER — Encounter: Payer: Self-pay | Admitting: Neurology

## 2022-07-20 VITALS — BP 133/67 | HR 73 | Ht 70.0 in | Wt 155.6 lb

## 2022-07-20 DIAGNOSIS — I6521 Occlusion and stenosis of right carotid artery: Secondary | ICD-10-CM | POA: Diagnosis not present

## 2022-07-20 DIAGNOSIS — G250 Essential tremor: Secondary | ICD-10-CM

## 2022-07-20 MED ORDER — LAMOTRIGINE 25 MG PO TABS: ORAL_TABLET | ORAL | 0 refills | Status: DC

## 2022-07-20 NOTE — Patient Instructions (Signed)
Start lamotrigine 25mg  - take 1 pill daily for 2 weeks, then increase to 1 pill twice daily.  If no improvement in 4-6 weeks, contact me Continue propranolol ER 80mg  daily for now Follow up 4-5 months.

## 2022-07-22 DIAGNOSIS — F341 Dysthymic disorder: Secondary | ICD-10-CM | POA: Diagnosis not present

## 2022-07-26 DIAGNOSIS — F341 Dysthymic disorder: Secondary | ICD-10-CM | POA: Diagnosis not present

## 2022-07-26 NOTE — Progress Notes (Signed)
Carelink Summary Report / Loop Recorder 

## 2022-08-02 DIAGNOSIS — F341 Dysthymic disorder: Secondary | ICD-10-CM | POA: Diagnosis not present

## 2022-08-03 ENCOUNTER — Ambulatory Visit (INDEPENDENT_AMBULATORY_CARE_PROVIDER_SITE_OTHER): Payer: Medicare PPO

## 2022-08-03 DIAGNOSIS — G459 Transient cerebral ischemic attack, unspecified: Secondary | ICD-10-CM | POA: Diagnosis not present

## 2022-08-03 LAB — CUP PACEART REMOTE DEVICE CHECK
Date Time Interrogation Session: 20231127231136
Implantable Pulse Generator Implant Date: 20210803

## 2022-08-05 DIAGNOSIS — F341 Dysthymic disorder: Secondary | ICD-10-CM | POA: Diagnosis not present

## 2022-08-18 ENCOUNTER — Other Ambulatory Visit: Payer: Self-pay | Admitting: Neurology

## 2022-08-27 ENCOUNTER — Encounter: Payer: Self-pay | Admitting: Cardiology

## 2022-08-31 DIAGNOSIS — F419 Anxiety disorder, unspecified: Secondary | ICD-10-CM | POA: Diagnosis not present

## 2022-08-31 DIAGNOSIS — Z0000 Encounter for general adult medical examination without abnormal findings: Secondary | ICD-10-CM | POA: Diagnosis not present

## 2022-08-31 DIAGNOSIS — E291 Testicular hypofunction: Secondary | ICD-10-CM | POA: Diagnosis not present

## 2022-08-31 DIAGNOSIS — I25119 Atherosclerotic heart disease of native coronary artery with unspecified angina pectoris: Secondary | ICD-10-CM | POA: Diagnosis not present

## 2022-08-31 DIAGNOSIS — F909 Attention-deficit hyperactivity disorder, unspecified type: Secondary | ICD-10-CM | POA: Diagnosis not present

## 2022-08-31 DIAGNOSIS — G250 Essential tremor: Secondary | ICD-10-CM | POA: Diagnosis not present

## 2022-09-02 NOTE — Progress Notes (Signed)
Carelink Summary Report / Loop Recorder 

## 2022-09-07 ENCOUNTER — Ambulatory Visit (INDEPENDENT_AMBULATORY_CARE_PROVIDER_SITE_OTHER): Payer: Medicare PPO

## 2022-09-07 DIAGNOSIS — G459 Transient cerebral ischemic attack, unspecified: Secondary | ICD-10-CM | POA: Diagnosis not present

## 2022-09-07 DIAGNOSIS — F341 Dysthymic disorder: Secondary | ICD-10-CM | POA: Diagnosis not present

## 2022-09-07 LAB — CUP PACEART REMOTE DEVICE CHECK
Date Time Interrogation Session: 20240101231107
Implantable Pulse Generator Implant Date: 20210803

## 2022-09-09 DIAGNOSIS — F341 Dysthymic disorder: Secondary | ICD-10-CM | POA: Diagnosis not present

## 2022-09-13 DIAGNOSIS — F341 Dysthymic disorder: Secondary | ICD-10-CM | POA: Diagnosis not present

## 2022-09-15 ENCOUNTER — Telehealth (HOSPITAL_COMMUNITY): Payer: Self-pay | Admitting: *Deleted

## 2022-09-15 ENCOUNTER — Encounter: Payer: Self-pay | Admitting: Cardiology

## 2022-09-15 ENCOUNTER — Ambulatory Visit: Payer: Medicare PPO | Attending: Cardiology | Admitting: Cardiology

## 2022-09-15 VITALS — BP 102/50 | HR 58 | Ht 70.0 in | Wt 154.1 lb

## 2022-09-15 DIAGNOSIS — F341 Dysthymic disorder: Secondary | ICD-10-CM | POA: Diagnosis not present

## 2022-09-15 DIAGNOSIS — E782 Mixed hyperlipidemia: Secondary | ICD-10-CM

## 2022-09-15 DIAGNOSIS — Z951 Presence of aortocoronary bypass graft: Secondary | ICD-10-CM | POA: Diagnosis not present

## 2022-09-15 DIAGNOSIS — E785 Hyperlipidemia, unspecified: Secondary | ICD-10-CM | POA: Diagnosis not present

## 2022-09-15 DIAGNOSIS — I6523 Occlusion and stenosis of bilateral carotid arteries: Secondary | ICD-10-CM

## 2022-09-15 DIAGNOSIS — R0609 Other forms of dyspnea: Secondary | ICD-10-CM

## 2022-09-15 DIAGNOSIS — R079 Chest pain, unspecified: Secondary | ICD-10-CM | POA: Diagnosis not present

## 2022-09-15 MED ORDER — RANOLAZINE ER 500 MG PO TB12: 500.0000 mg | ORAL_TABLET | Freq: Two times a day (BID) | ORAL | 6 refills | Status: DC

## 2022-09-15 NOTE — Patient Instructions (Signed)
Medication Instructions:   START: Ranolazine 500mg  one tablet twice daily  *If you need a refill on your cardiac medications before your next appointment, please call your pharmacy*   Lab Work: None ordered If you have labs (blood work) drawn today and your tests are completely normal, you will receive your results only by: MyChart Message (if you have MyChart) OR A paper copy in the mail If you have any lab test that is abnormal or we need to change your treatment, we will call you to review the results.   Testing/Procedures: Your physician has requested that you have a Exercise Cardiolite.  For further information please visit . Please follow instruction sheet, as given.  The test will take approximately 3 to 4 hours to complete; you may bring reading material.  If someone comes with you to your appointment, they will need to remain in the main lobby due to limited space in the testing area.  How to prepare for your Myocardial Perfusion Test: Do not eat or drink 3 hours prior to your test, except you may have water. Do not consume products containing caffeine (regular or decaffeinated) 12 hours prior to your test. (ex: coffee, chocolate, sodas, tea). Do bring a list of your current medications with you.  If not listed below, you may take your medications as normal. Do wear comfortable clothes (no dresses or overalls) and walking shoes, tennis shoes preferred (No heels or open toe shoes are allowed). Do NOT wear cologne, perfume, aftershave, or lotions (deodorant is allowed). If these instructions are not followed, your test will have to be rescheduled.  Your physician has requested that you have an echocardiogram. Echocardiography is a painless test that uses sound waves to create images of your heart. It provides your doctor with information about the size and shape of your heart and how well your heart's chambers and valves are working. This procedure takes  approximately one hour. There are no restrictions for this procedure.   Follow-Up: At [facility], you and your health needs are our priority.  As part of our continuing mission to provide you with exceptional heart care, we have created designated Provider Care Teams.  These Care Teams include your primary Cardiologist (physician) and Advanced Practice Providers (APPs -  Physician Assistants and Nurse Practitioners) who all work together to provide you with the care you need, when you need it.  We recommend signing up for the patient portal called "MyChart".  Sign up information is provided on this After Visit Summary.  MyChart is used to connect with patients for Virtual Visits (Telemedicine).  Patients are able to view lab/test results, encounter notes, upcoming appointments, etc.  Non-urgent messages can be sent to your provider as well.   To learn more about what you can do with MyChart, go to [facility].    Your next appointment:   3 month(s)  The format for your next appointment:   In Person  Provider:   [email or web address], MD   Other Instructions Cardiac Nuclear Scan A cardiac nuclear scan is a test that is done to check the flow of blood to your heart. It is done when you are resting and when you are exercising. The test looks for problems such as: Not enough blood reaching a portion of the heart. The heart muscle not working as it should. You may need this test if: You have heart disease. You have had lab results that are not normal. You have had heart surgery or a balloon  procedure to open up blocked arteries (angioplasty). You have chest pain. You have shortness of breath. In this test, a special dye (tracer) is put into your bloodstream. The tracer will travel to your heart. A camera will then take pictures of your heart to see how the tracer moves through your heart. This test is usually done at a hospital and takes 2-4 hours. Tell a doctor about: Any  allergies you have. All medicines you are taking, including vitamins, herbs, eye drops, creams, and over-the-counter medicines. Any problems you or family members have had with anesthetic medicines. Any blood disorders you have. Any surgeries you have had. Any medical conditions you have. Whether you are pregnant or may be pregnant. What are the risks? Generally, this is a safe test. However, problems may occur, such as: Serious chest pain and heart attack. This is only a risk if the stress portion of the test is done. Rapid heartbeat. A feeling of warmth in your chest. This feeling usually does not last long. Allergic reaction to the tracer. What happens before the test? Ask your doctor about changing or stopping your normal medicines. This is important. Follow instructions from your doctor about what you cannot eat or drink. Remove your jewelry on the day of the test. What happens during the test? An IV tube will be inserted into one of your veins. Your doctor will give you a small amount of tracer through the IV tube. You will wait for 20-40 minutes while the tracer moves through your bloodstream. Your heart will be monitored with an electrocardiogram (ECG). You will lie down on an exam table. Pictures of your heart will be taken for about 15-20 minutes. You may also have a stress test. For this test, one of these things may be done: You will be asked to exercise on a treadmill or a stationary bike. You will be given medicines that will make your heart work harder. This is done if you are unable to exercise. When blood flow to your heart has peaked, a tracer will again be given through the IV tube. After 20-40 minutes, you will get back on the exam table. More pictures will be taken of your heart. Depending on the tracer that is used, more pictures may need to be taken 3-4 hours later. Your IV tube will be removed when the test is over. The test may vary among doctors and  hospitals. What happens after the test? Ask your doctor: Whether you can return to your normal schedule, including diet, activities, and medicines. Whether you should drink more fluids. This will help to remove the tracer from your body. Drink enough fluid to keep your pee (urine) pale yellow. Ask your doctor, or the department that is doing the test: When will my results be ready? How will I get my results? Summary A cardiac nuclear scan is a test that is done to check the flow of blood to your heart. Tell your doctor whether you are pregnant or may be pregnant. Before the test, ask your doctor about changing or stopping your normal medicines. This is important. Ask your doctor whether you can return to your normal activities. You may be asked to drink more fluids. This information is not intended to replace advice given to you by your health care provider. Make sure you discuss any questions you have with your health care provider. Document Revised: 12/13/2018 Document Reviewed: 02/06/2018 Elsevier Patient Education  2021 [employer].    Echocardiogram An echocardiogram is a  test that uses sound waves (ultrasound) to produce images of the heart. Images from an echocardiogram can provide important information about: Heart size and shape. The size and thickness and movement of your heart's walls. Heart muscle function and strength. Heart valve function or if you have stenosis. Stenosis is when the heart valves are too narrow. If blood is flowing backward through the heart valves (regurgitation). A tumor or infectious growth around the heart valves. Areas of heart muscle that are not working well because of poor blood flow or injury from a heart attack. Aneurysm detection. An aneurysm is a weak or damaged part of an artery wall. The wall bulges out from the normal force of blood pumping through the body. Tell a health care provider about: Any allergies you have. All medicines you are  taking, including vitamins, herbs, eye drops, creams, and over-the-counter medicines. Any blood disorders you have. Any surgeries you have had. Any medical conditions you have. Whether you are pregnant or may be pregnant. What are the risks? Generally, this is a safe test. However, problems may occur, including an allergic reaction to dye (contrast) that may be used during the test. What happens before the test? No specific preparation is needed. You may eat and drink normally. What happens during the test? You will take off your clothes from the waist up and put on a hospital gown. Electrodes or electrocardiogram (ECG)patches may be placed on your chest. The electrodes or patches are then connected to a device that monitors your heart rate and rhythm. You will lie down on a table for an ultrasound exam. A gel will be applied to your chest to help sound waves pass through your skin. A handheld device, called a transducer, will be pressed against your chest and moved over your heart. The transducer produces sound waves that travel to your heart and bounce back (or "echo" back) to the transducer. These sound waves will be captured in real-time and changed into images of your heart that can be viewed on a video monitor. The images will be recorded on a computer and reviewed by your health care provider. You may be asked to change positions or hold your breath for a short time. This makes it easier to get different views or better views of your heart. In some cases, you may receive contrast through an IV in one of your veins. This can improve the quality of the pictures from your heart. The procedure may vary among health care providers and hospitals.    What can I expect after the test? You may return to your normal, everyday life, including diet, activities, and medicines, unless your health care provider tells you not to do that. Follow these instructions at home: It is up to you to get the  results of your test. Ask your health care provider, or the department that is doing the test, when your results will be ready. Keep all follow-up visits. This is important. Summary An echocardiogram is a test that uses sound waves (ultrasound) to produce images of the heart. Images from an echocardiogram can provide important information about the size and shape of your heart, heart muscle function, heart valve function, and other possible heart problems. You do not need to do anything to prepare before this test. You may eat and drink normally. After the echocardiogram is completed, you may return to your normal, everyday life, unless your health care provider tells you not to do that. This information is not intended to  replace advice given to you by your health care provider. Make sure you discuss any questions you have with your health care provider. Document Revised: 04/15/2020 Document Reviewed: 04/15/2020 Elsevier Patient Education  2021 [employer].

## 2022-09-15 NOTE — Progress Notes (Signed)
Cardiology Office Note:    Date:  09/15/2022   ID:  Zachary Ponce, DOB 11-Aug-1951, MRN [id number]  PCP:  Shirline Frees, MD  Cardiologist:  Jenne Campus, MD    Referring MD: Shirline Frees, MD   Chief Complaint  Patient presents with   Shortness of Breath    Ongoing for 3-4 month    History of Present Illness:    Zachary Ponce is a 72 y.o. male past medical history significant for coronary artery disease status post coronary bypass grafting 2019, carotic artery stenosis up to 79% followed by vascular surgeon from [facility], intermittent 5 he does have appoint with them tomorrow.  He does have implantable loop recorder, no arrhythmia detected, essential hypertension, dyslipidemia, bradycardia.  He comes today to my office for follow-up he complained of having some shortness of breath with exercise.  He does have a grandson who I do playing with him but lately he noted shortness of breath.  Denies have any chest pain tightness squeezing pressure burning chest.  What is worrisome is the fact that before his bypass surgery in 2019 his symptoms were also shortness of breath.  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   Bilateral carotid artery disease ([place]) 03/07/2018   Left CEA 03/08/18 at time of CABG    CAD (coronary artery disease) 03/03/2018   Cardiac cath  03/03/18 70% distal left main, 99% proximal LAD, 40-50% proximal circumflex, 60% intermediate, 90% ostial right coronary artery, 55% LVEF with some mild distal anterior hypokinesis   Depression 06/30/2016   Dyslipidemia 07/24/2015   The 10-year ASCVD risk score [name] DC Jr., et al., 2013) is: 15.7%   Values used to calculate the score:     Age: 61 years     Sex: Male     Is Non-Hispanic African American: No     Diabetic: No     Tobacco smoker: No     Systolic Blood Pressure: 941 mmHg     Is BP treated: No     HDL Cholesterol: 32 mg/dL      Total Cholesterol: 209 mg/dL The 10-year ASCVD risk score [name] DC Jr., et al., 2013) is   Essential tremor    Generalized anxiety disorder 01/22/2016   History of traumatic head injury    Hyperlipidemia 03/03/2018   Hypogonadism in male 06/20/2018   Hypotension after procedure 06/22/2018   Memory loss 06/30/2016   On amiodarone therapy 06/22/2018   PAF (paroxysmal atrial fibrillation) ([place]) 06/22/2018   Post concussion syndrome 06/30/2016   Post-concussion headache 06/30/2016   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 LIMA TO LAD, SVG TO OM, SVG TO RAMUS, SVG TO PD.;  Surgeon: Ivin Poot, MD;  Location: [facility];  Service: Open Heart Surgery;  Laterality: N/A;   ENDARTERECTOMY Left 03/08/2018   Procedure: ENDARTERECTOMY CAROTID;  Surgeon: Elam Dutch, MD;  Location: [facility];  Service: Vascular;  Laterality: Left;   KNEE SURGERY     LEFT HEART CATH AND CORONARY ANGIOGRAPHY N/A 03/03/2018   Procedure: LEFT HEART CATH AND CORONARY ANGIOGRAPHY;  Surgeon: Troy Sine, MD;  Location: [facility];  Service: Cardiovascular;  Laterality:  N/A;   TEE WITHOUT CARDIOVERSION N/A 03/08/2018   Procedure: TRANSESOPHAGEAL ECHOCARDIOGRAM (TEE);  Surgeon: Prescott Gum, Collier Salina, MD;  Location: [facility];  Service: Open Heart Surgery;  Laterality: N/A;   TONSILLECTOMY      Current Medications: Current Meds  Medication Sig   atorvastatin (LIPITOR) 20 MG tablet Take 1 tablet (20 mg total) by mouth daily.   buPROPion (WELLBUTRIN XL) 300 MG 24 hr tablet Take 300 mg by mouth daily.   clopidogrel (PLAVIX) 75 MG tablet TAKE 1 TABLET BY MOUTH EVERY DAY   escitalopram (LEXAPRO) 5 MG tablet Take 10 mg by mouth daily.   lamoTRIgine (LAMICTAL) 25 MG tablet TAKE 1 TABLET DAILY FOR TWO  WEEKS, THEN INCREASE TO 1 TABLET TWICE DAILY (Patient taking differently: Take 25 mg by mouth daily. Take 1 tablet daily for two weeks, then increase to 1 tablet twice daily)   lisdexamfetamine (VYVANSE) 40 MG capsule Take 40 mg by mouth every morning.   NON FORMULARY Apply 50 mg topically daily. Testosterone 50mg /ml   propranolol ER (INDERAL LA) 80 MG 24 hr capsule Take 1 capsule (80 mg total) by mouth daily.     Allergies:   Patient has no known allergies.   Social History   Socioeconomic History   Marital status: Married    Spouse name: Not on file   Number of children: Not on file   Years of education: Not on file   Highest education level: Not on file  Occupational History   Not on file  Tobacco Use   Smoking status: Never   Smokeless tobacco: Never  Vaping Use   Vaping Use: Never used  Substance and Sexual Activity   Alcohol use: Not Currently   Drug use: Never   Sexual activity: Yes  Other Topics Concern   Not on file  Social History Narrative   Left handed   3 story home   Drink decaffaine   Social Determinants of Health   Financial Resource Strain: Not on file  Food Insecurity: No Food Insecurity (07/20/2021)   Hunger Vital Sign    Worried About Running Out of Food in the Last Year: Never true    Ran Out of Food in the Last Year: Never true  Transportation Needs: Not on file  Physical Activity: Not on file  Stress: Not on file  Social Connections: Not on file     Family History: The patient's family history includes Diabetes in his brother; Lung cancer in his mother. ROS:   Please see the history of present illness.    All 14 point review of systems negative except as described per history of present illness  EKGs/Labs/Other Studies Reviewed:      Recent Labs: No results found for requested labs within last 365 days.  Recent Lipid Panel    Component Value Date/Time   CHOL 112 04/07/2022 1531   TRIG 120 04/07/2022 1531   HDL 39 (L) 04/07/2022  1531   CHOLHDL 2.9 04/07/2022 1531   LDLCALC 51 04/07/2022 1531    Physical Exam:    VS:  BP (!) 102/50 (BP Location: Left Arm, Patient Position: Sitting)   Pulse (!) 58   Ht 5\' 10"  (1.778 m)   Wt 154 lb 1.9 oz (69.9 kg)   SpO2 98%   BMI 22.11 kg/m     Wt Readings from Last 3 Encounters:  09/15/22 154 lb 1.9 oz (69.9 kg)  07/20/22 155 lb 9.6 oz (70.6 kg)  04/07/22 158 lb (71.7 kg)  GEN:  Well nourished, well developed in no acute distress HEENT: Normal NECK: No JVD; No carotid bruits LYMPHATICS: No lymphadenopathy CARDIAC: RRR, no murmurs, no rubs, no gallops RESPIRATORY:  Clear to auscultation without rales, wheezing or rhonchi  ABDOMEN: Soft, non-tender, non-distended MUSCULOSKELETAL:  No edema; No deformity  SKIN: Warm and dry LOWER EXTREMITIES: no swelling NEUROLOGIC:  Alert and oriented x 3 PSYCHIATRIC:  Normal affect   ASSESSMENT:    1. S/P CABG x 4   2. Mixed hyperlipidemia   3. Dyslipidemia   4. Bilateral carotid artery stenosis    PLAN:    In order of problems listed above:  Dyspnea on exertion.  I will schedule him to have echocardiogram to assess left ventricle ejection fraction, he also will be scheduled to have exercise Cardiolite to rule out ischemia.  In the meantime I will initiate therapy with ranolazine 500 mg twice daily, I warned him about potential side effects which could be constipation. Dyslipidemia, I did review K PN which show me his LDL 51 HDL 39 we will continue present management. Bilateral carotic artery stenosis.  That being followed by vascular surgeon that he see tomorrow.   Medication Adjustments/Labs and Tests Ordered: Current medicines are reviewed at length with the patient today.  Concerns regarding medicines are outlined above.  No orders of the defined types were placed in this encounter.  Medication changes: No orders of the defined types were placed in this encounter.   Signed, Georgeanna Lea, MD,  [facility] 09/15/2022 10:17 AM    [facility]

## 2022-09-15 NOTE — Addendum Note (Signed)
Addended by: Jacobo Forest D on: 09/15/2022 04:45 PM   Modules accepted: Orders

## 2022-09-15 NOTE — Telephone Encounter (Signed)
Per DPR left detailed instructions for MPI study on 09/17/22.

## 2022-09-15 NOTE — Addendum Note (Signed)
Addended by: Jacobo Forest D on: 09/15/2022 10:50 AM   Modules accepted: Orders

## 2022-09-15 NOTE — Addendum Note (Signed)
Addended by: Jacobo Forest D on: 09/15/2022 10:41 AM   Modules accepted: Orders

## 2022-09-16 ENCOUNTER — Ambulatory Visit: Payer: Medicare PPO | Admitting: Physician Assistant

## 2022-09-16 ENCOUNTER — Ambulatory Visit (HOSPITAL_COMMUNITY)
Admission: RE | Admit: 2022-09-16 | Discharge: 2022-09-16 | Disposition: A | Discharge status: Home / Self Care | Payer: Medicare PPO | Source: Ambulatory Visit | Attending: Vascular Surgery | Admitting: Vascular Surgery

## 2022-09-16 ENCOUNTER — Encounter: Payer: Self-pay | Admitting: Physician Assistant

## 2022-09-16 VITALS — BP 128/66 | HR 54 | Temp 98.1°F | Resp 20 | Ht 70.0 in | Wt 156.9 lb

## 2022-09-16 DIAGNOSIS — I6523 Occlusion and stenosis of bilateral carotid arteries: Secondary | ICD-10-CM

## 2022-09-16 NOTE — Progress Notes (Signed)
History of Present Illness:  Patient is a 72 y.o. year old male who presents for evaluation of carotid stenosis.  The patient denies symptoms of TIA, amaurosis, or stroke.   He has hx of combination CABG and left CEA with Dr. Alla German and Dr. Darrick Penna in July 2019.  Originally he had a scan that stated he had high grade stenosis B ICA.  On follow up the right ICA has been stable and the left ICA has had no evidence of restenosis.   He was last seen 01/2022 and the duplex demonstrated 60-79 % stenosis with a PSV 66. He is medically managed on ASA.  He states he has had symptoms of SOB and he has a nuclear stress test.  He is very anxious.       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   Bilateral carotid artery disease (HCC) 03/07/2018   Left CEA 03/08/18 at time of CABG    CAD (coronary artery disease) 03/03/2018   Cardiac cath  03/03/18 70% distal left main, 99% proximal LAD, 40-50% proximal circumflex, 60% intermediate, 90% ostial right coronary artery, 55% LVEF with some mild distal anterior hypokinesis   Depression 06/30/2016   Dyslipidemia 07/24/2015   The 10-year ASCVD risk score [name], et al., 2013) is: 15.7%   Values used to calculate the score:     Age: 5 years     Sex: Male     Is Non-Hispanic African American: No     Diabetic: No     Tobacco smoker: No     Systolic Blood Pressure: 114 mmHg     Is BP treated: No     HDL Cholesterol: 32 mg/dL     Total Cholesterol: 209 mg/dL The 76-HYWV ASCVD risk score [name], et al., 2013) is   Essential tremor    Generalized anxiety disorder 01/22/2016   History of traumatic head injury    Hyperlipidemia 03/03/2018   Hypogonadism in male 06/20/2018   Hypotension after procedure 06/22/2018   Memory loss 06/30/2016   On amiodarone therapy 06/22/2018   PAF (paroxysmal atrial fibrillation) (HCC) 06/22/2018   Post concussion syndrome 06/30/2016    Post-concussion headache 06/30/2016   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 LIMA TO LAD, SVG TO OM, SVG TO RAMUS, SVG TO PD.;  Surgeon: [name], MD;  Location: [facility] OR;  Service: Open Heart Surgery;  Laterality: N/A;   ENDARTERECTOMY Left 03/08/2018   Procedure: ENDARTERECTOMY CAROTID;  Surgeon: [name], MD;  Location: [facility] OR;  Service: Vascular;  Laterality: Left;   KNEE SURGERY     LEFT HEART CATH AND CORONARY ANGIOGRAPHY N/A 03/03/2018   Procedure: LEFT HEART CATH AND CORONARY ANGIOGRAPHY;  Surgeon: [name], MD;  Location: [facility] INVASIVE CV LAB;  Service: Cardiovascular;  Laterality: N/A;   TEE WITHOUT CARDIOVERSION N/A 03/08/2018   Procedure: TRANSESOPHAGEAL ECHOCARDIOGRAM (TEE);  Surgeon: [name], [name], MD;  Location: [facility] OR;  Service: Open Heart Surgery;  Laterality: N/A;   TONSILLECTOMY       Social History Social History   Tobacco Use   Smoking status: Never    Passive exposure:  Never   Smokeless tobacco: Never  Vaping Use   Vaping Use: Never used  Substance Use Topics   Alcohol use: Not Currently   Drug use: Never    Family History Family History  Problem Relation Age of Onset   Lung cancer Mother    Diabetes Brother     Allergies  No Known Allergies   Current Outpatient Medications  Medication Sig Dispense Refill   atorvastatin (LIPITOR) 20 MG tablet Take 1 tablet (20 mg total) by mouth daily. 90 tablet 2   buPROPion (WELLBUTRIN XL) 300 MG 24 hr tablet Take 300 mg by mouth daily.     clopidogrel (PLAVIX) 75 MG tablet TAKE 1 TABLET BY MOUTH EVERY DAY 90 tablet 2   escitalopram (LEXAPRO) 5 MG tablet Take 10 mg by mouth daily.  0   lamoTRIgine (LAMICTAL) 25 MG tablet TAKE 1 TABLET DAILY FOR TWO WEEKS, THEN  INCREASE TO 1 TABLET TWICE DAILY (Patient taking differently: Take 25 mg by mouth daily. Take 1 tablet daily for two weeks, then increase to 1 tablet twice daily) 180 tablet 1   lisdexamfetamine (VYVANSE) 40 MG capsule Take 40 mg by mouth every morning.     NON FORMULARY Apply 50 mg topically daily. Testosterone 50mg /ml  0   propranolol ER (INDERAL LA) 80 MG 24 hr capsule Take 1 capsule (80 mg total) by mouth daily. 90 capsule 3   ranolazine (RANEXA) 500 MG 12 hr tablet Take 1 tablet (500 mg total) by mouth 2 (two) times daily. 60 tablet 6   No current facility-administered medications for this visit.   Facility-Administered Medications Ordered in Other Visits  Medication Dose Route Frequency Provider Last Rate Last Admin   sodium chloride flush (NS) 0.9 % injection 10 mL  10 mL Intravenous PRN Jaffe, Adam R, DO   20 mL at 03/20/20 1125    ROS:   General:  No weight loss, Fever, chills  HEENT: No recent headaches, no nasal bleeding, no visual changes, no sore throat  Neurologic: No dizziness, blackouts, seizures. No recent symptoms of stroke or mini- stroke. No recent episodes of slurred speech, or temporary blindness.  Cardiac: No recent episodes of chest pain/pressure, no shortness of breath at rest.  positive shortness of breath with exertion.  Denies history of atrial fibrillation or irregular heartbeat  Vascular: No history of rest pain in feet.  No history of claudication.  No history of non-healing ulcer, No history of DVT   Pulmonary: No home oxygen, no productive cough, no hemoptysis,  No asthma or wheezing  Musculoskeletal:  [ ]  Arthritis, [ ]  Low back pain,  [ ]  Joint pain  Hematologic:No history of hypercoagulable state.  No history of easy bleeding.  No history of anemia  Gastrointestinal: No hematochezia or melena,  No gastroesophageal reflux, no trouble swallowing  Urinary: [ ]  chronic Kidney disease, [ ]  on HD - [ ]  MWF or [ ]  TTHS, [ ]  Burning with urination, [ ]   Frequent urination, [ ]  Difficulty urinating;   Skin: No rashes  Psychological: positve history of anxiety,  No history of depression   Physical Examination  Vitals:   09/16/22 1035 09/16/22 1036  BP: 138/66 128/66  Pulse: (!) 54   Resp: 20   Temp: 98.1 F (36.7 C)   TempSrc: Temporal   SpO2: 100%   Weight: 156 lb 14.4 oz (71.2 kg)   Height: 5\' 10"  (1.778 m)     Body mass index is 22.51 kg/m.  General:  Alert and oriented, no acute distress HEENT: Normal Neck: No bruit or JVD Pulmonary: Clear to auscultation bilaterally Cardiac: Regular Rate and Rhythm without murmur Gastrointestinal: Soft, non-tender, non-distended, no mass, no scars Skin: No rash Extremity Pulses:  2+ radial pulses bilaterally Musculoskeletal: No deformity or edema  Neurologic: Upper and lower extremity motor 5/5 and symmetric  DATA:   Right Carotid Findings:  +----------+--------+--------+--------+--------------------------+--------+            PSV cm/sEDV cm/sStenosisPlaque Description         Comments  +----------+--------+--------+--------+--------------------------+--------+   CCA Prox  96      15                                                   +----------+--------+--------+--------+--------------------------+--------+   CCA Mid   99      17                                                   +----------+--------+--------+--------+--------------------------+--------+   CCA Distal69      15              heterogenous                         +----------+--------+--------+--------+--------------------------+--------+   ICA Prox  321     81      60-79%  heterogenous and irregular           +----------+--------+--------+--------+--------------------------+--------+   ICA Mid   129     29                                                   +----------+--------+--------+--------+--------------------------+--------+   ICA Distal66      15                                                    +----------+--------+--------+--------+--------------------------+--------+   ECA      117     13                                                   +----------+--------+--------+--------+--------------------------+--------+    +----------+--------+-------+----------------+-------------------+           PSV cm/sEDV cmsDescribe        Arm Pressure (mmHG)  +----------+--------+-------+----------------+-------------------+  [id number]    7      Multiphasic, [id number]                  +----------+--------+-------+----------------+-------------------+   +---------+--------+--+--------+-+---------+  VertebralPSV cm/s60EDV cm/s8Antegrade  +---------+--------+--+--------+-+---------+      Left Carotid Findings:  +----------+--------+--------+--------+------------------+--------+           PSV cm/sEDV cm/sStenosisPlaque DescriptionComments  +----------+--------+--------+--------+------------------+--------+  CCA Prox  134     23                                          +----------+--------+--------+--------+------------------+--------+  CCA Mid   111     17                                          +----------+--------+--------+--------+------------------+--------+  CCA Distal113     20                                          +----------+--------+--------+--------+------------------+--------+  ICA Prox  83      22      1-39%                               +----------+--------+--------+--------+------------------+--------+  ICA Mid   91      26                                          +----------+--------+--------+--------+------------------+--------+  ICA Distal76      23                                          +----------+--------+--------+--------+------------------+--------+  ECA      105     4                                            +----------+--------+--------+--------+------------------+--------+   +----------+--------+--------+----------------+-------------------+           PSV cm/sEDV cm/sDescribe        Arm Pressure (mmHG)  +----------+--------+--------+----------------+-------------------+  Subclavian241    15      Multiphasic, [id number]                  +----------+--------+--------+----------------+-------------------+   +---------+--------+--+--------+--+---------+  VertebralPSV cm/s48EDV cm/s13Antegrade  +---------+--------+--+--------+--+---------+   Summary:  Right Carotid: Velocities in the right ICA are consistent with a 60-79%                 stenosis.   Left Carotid: Velocities in the left ICA are consistent with a 1-39%  stenosis.   Vertebrals: Bilateral vertebral arteries demonstrate antegrade flow.  Subclavians: Normal flow hemodynamics were seen in bilateral subclavian               arteries.    ASSESSMENT/ PLAN:  History of CAD and left > 80% stenosis ICA s/p combination CABG and CEA on the left by Dr. Oneida Alar on 03/08/2018.  The duplex demonstrates no new re current ICA stenosis on the left and 60-79% stenosis asymptomatic on the right ICA.  The PSV is 81 compared to 66 last visit.  We will keep a close eye on his ica stenosis and have him f/u in 6 months for repeat carotid duplex.  He will call 911 if he develops symptoms of stroke or TIA.    Continue medical management with Statin and Plavix daily.    Roxy Horseman PA-C [facility] Office: [phone]  MD in clinic [place]

## 2022-09-16 NOTE — Addendum Note (Signed)
Addended by: Jenne Campus on: 09/16/2022 10:04 PM   Modules accepted: Orders

## 2022-09-17 ENCOUNTER — Ambulatory Visit (HOSPITAL_COMMUNITY): Payer: Medicare PPO | Attending: Cardiology

## 2022-09-17 DIAGNOSIS — R079 Chest pain, unspecified: Secondary | ICD-10-CM | POA: Diagnosis not present

## 2022-09-17 LAB — MYOCARDIAL PERFUSION IMAGING
LV dias vol: 94 mL (ref 62–150)
LV sys vol: 39 mL
Nuc Stress EF: 58 %
Peak HR: 112 {beats}/min
Rest HR: 52 {beats}/min
Rest Nuclear Isotope Dose: 10.9 mCi
SDS: 0
SRS: 0
SSS: 0
ST Depression (mm): 0 mm
Stress Nuclear Isotope Dose: 935 mCi
TID: 0.93

## 2022-09-17 MED ORDER — TECHNETIUM TC 99M TETROFOSMIN IV KIT
31.9000 | PACK | Freq: Once | INTRAVENOUS | Status: AC | PRN
  Administered 2022-09-17: 31.9 via INTRAVENOUS

## 2022-09-17 MED ORDER — REGADENOSON 0.4 MG/5ML IV SOLN
0.4000 mg | Freq: Once | INTRAVENOUS | Status: AC
  Administered 2022-09-17: 0.4 mg via INTRAVENOUS

## 2022-09-17 MED ORDER — TECHNETIUM TC 99M TETROFOSMIN IV KIT
10.9000 | PACK | Freq: Once | INTRAVENOUS | Status: AC | PRN
  Administered 2022-09-17: 10.9 via INTRAVENOUS

## 2022-09-20 DIAGNOSIS — F341 Dysthymic disorder: Secondary | ICD-10-CM | POA: Diagnosis not present

## 2022-09-21 DIAGNOSIS — F900 Attention-deficit hyperactivity disorder, predominantly inattentive type: Secondary | ICD-10-CM | POA: Diagnosis not present

## 2022-09-22 ENCOUNTER — Other Ambulatory Visit: Payer: Self-pay

## 2022-09-22 DIAGNOSIS — I6523 Occlusion and stenosis of bilateral carotid arteries: Secondary | ICD-10-CM

## 2022-09-23 DIAGNOSIS — F341 Dysthymic disorder: Secondary | ICD-10-CM | POA: Diagnosis not present

## 2022-09-27 DIAGNOSIS — F341 Dysthymic disorder: Secondary | ICD-10-CM | POA: Diagnosis not present

## 2022-09-28 ENCOUNTER — Ambulatory Visit: Payer: Medicare PPO | Admitting: Cardiology

## 2022-09-29 DIAGNOSIS — F341 Dysthymic disorder: Secondary | ICD-10-CM | POA: Diagnosis not present

## 2022-10-04 ENCOUNTER — Ambulatory Visit: Payer: Medicare PPO | Admitting: Physician Assistant

## 2022-10-08 ENCOUNTER — Ambulatory Visit (HOSPITAL_COMMUNITY): Payer: Medicare PPO | Attending: Cardiology

## 2022-10-08 DIAGNOSIS — R0609 Other forms of dyspnea: Secondary | ICD-10-CM | POA: Insufficient documentation

## 2022-10-08 DIAGNOSIS — F341 Dysthymic disorder: Secondary | ICD-10-CM | POA: Diagnosis not present

## 2022-10-08 LAB — ECHOCARDIOGRAM COMPLETE
Area-P 1/2: 3.3 cm2
S' Lateral: 2.7 cm

## 2022-10-08 NOTE — Progress Notes (Signed)
Carelink Summary Report / Loop Recorder 

## 2022-10-10 LAB — CUP PACEART REMOTE DEVICE CHECK
Date Time Interrogation Session: 20240203230746
Implantable Pulse Generator Implant Date: 20210803

## 2022-10-11 ENCOUNTER — Ambulatory Visit: Payer: Medicare PPO

## 2022-10-11 DIAGNOSIS — G459 Transient cerebral ischemic attack, unspecified: Secondary | ICD-10-CM | POA: Diagnosis not present

## 2022-10-14 ENCOUNTER — Other Ambulatory Visit: Payer: Self-pay | Admitting: Cardiology

## 2022-10-15 ENCOUNTER — Telehealth: Payer: Self-pay

## 2022-10-15 NOTE — Telephone Encounter (Signed)
Results reviewed with pt as per Dr. Krasowski's note.  Pt verbalized understanding and had no additional questions. Routed to PCP  

## 2022-11-08 DIAGNOSIS — F341 Dysthymic disorder: Secondary | ICD-10-CM | POA: Diagnosis not present

## 2022-11-11 DIAGNOSIS — F341 Dysthymic disorder: Secondary | ICD-10-CM | POA: Diagnosis not present

## 2022-11-15 ENCOUNTER — Ambulatory Visit (INDEPENDENT_AMBULATORY_CARE_PROVIDER_SITE_OTHER): Payer: Medicare PPO

## 2022-11-15 DIAGNOSIS — G459 Transient cerebral ischemic attack, unspecified: Secondary | ICD-10-CM

## 2022-11-15 DIAGNOSIS — F341 Dysthymic disorder: Secondary | ICD-10-CM | POA: Diagnosis not present

## 2022-11-17 LAB — CUP PACEART REMOTE DEVICE CHECK
Date Time Interrogation Session: 20240310232042
Implantable Pulse Generator Implant Date: 20210803

## 2022-11-17 NOTE — Progress Notes (Unsigned)
NEUROLOGY FOLLOW UP OFFICE NOTE  Zachary Ponce [id number]  Assessment/Plan:   Essential tremor Right internal carotid artery stenosis Dyslipidemia History of transient ischemic attack   1.For tremor:  continue propranolol ER '80mg'$  daily.  We will try lamotrigine '25mg'$  daily for 2 weeks, then increase to '25mg'$  twice daily. *** 2.Secondary stroke prevention as managed by PCP: Plavix '75mg'$  daily -  Statin therapy.  LDL goal less than 70 -  Glycemic control.  Hgb A1c goal less than 7 -  Normotensive blood pressure 3. Carotid stenosis monitored by vascular surgery 4. A fib monitoring with loop recorder 5. Follow up ***.   Subjective:  Zachary Ponce is a 72 year old ambidextrous Caucasian male with CAD s/p coronary artery bypass grafting, left carotid artery disease s/p CEA, HLD, depression and anxiety, essential tremor and history of traumatic brain injury and TIA who follows up for essential tremor.   UPDATE: Current medications:  Plavix '75mg'$  daily, atorvastatin '20mg'$  daily, propranolol ER '80mg'$  daily (higher doses caused hypotension), lamotrigine '25mg'$  BID  To better control tremors, lamotrigine was added last visit.  ***   He is with implantable loop recorder.  No atrial fibrillation has yet been identified.  Carotid ultrasound in January revealed stable 60-79% stenosis in the right ICA, 1-39% stenosis in the left ICA.  2D echocardiogram in February demonstrated LVEF 60-65% with no regional wall motion abnormalities, trivial MVR, and aortic valve sclerosis/calcification but without stenosis and no atrial level shunt.      HISTORY:  In late April 2021, he had an episode of dizziness, nausea, vomiting.  He was laying on the bed with his right arm laying over the side and couldn't move it for about 5 minutes.  EMS was called and blood pressure was reportedly 0000000 systolic and EKG reportedly normal.     Carotid ultrasound on 01/16/2020 showed 60-79% right ICA stenosis and 1-39% left  ICA stenosis.  MRI of brain without contrast on 01/18/2020 personally reviewed showed mild chronic small vessel ischemic changes and small remote left frontal infact but no acute intracranial abnormality.     CTA of head and neck from 03/17/2020 personally reviewed showed moderate M1 MCA stenoses bilaterally and 65-70% proximal right ICA stenosis and moderate stenosis at origin of the dominant right vertebral artery.  2D echocardiogram on 03/20/2020 showed EF 60-65% with no cardiac source of embolus.  Carotid ultrasound on 01/28/2021 showed right ICA 60-79% stenosis, no hemodynamically significant stenosis of left ICA   In 2019, he underwent left CEA for carotid artery stenosis and coronary bypass grafting.  Following procedure, he did have episodes of atrial fibrillation and was on Eliquis for a period of time.    He also has essential tremor.  Primidone caused nightmares.  Topiramate made him feel "wired".   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   Bilateral carotid artery disease ([place]) 03/07/2018   Left CEA 03/08/18 at time of CABG    CAD (coronary artery disease) 03/03/2018   Cardiac cath  03/03/18 70% distal left main, 99% proximal LAD, 40-50% proximal circumflex, 60% intermediate, 90% ostial right coronary artery, 55% LVEF with some mild distal anterior hypokinesis   Depression 06/30/2016   Dyslipidemia 07/24/2015   The 10-year ASCVD risk score [name] DC Jr., et al., 2013) is: 15.7%   Values used to calculate the score:     Age: 72 years  Sex: Male     Is Non-Hispanic African American: No     Diabetic: No     Tobacco smoker: No     Systolic Blood Pressure: 99991111 mmHg     Is BP treated: No     HDL Cholesterol: 32 mg/dL     Total Cholesterol: 209 mg/dL The 10-year ASCVD risk score [name] DC Jr., et al., 2013) is   Essential tremor    Generalized anxiety disorder 01/22/2016   History of  traumatic head injury    Hyperlipidemia 03/03/2018   Hypogonadism in male 06/20/2018   Hypotension after procedure 06/22/2018   Memory loss 06/30/2016   On amiodarone therapy 06/22/2018   PAF (paroxysmal atrial fibrillation) ([place]) 06/22/2018   Post concussion syndrome 06/30/2016   Post-concussion headache 06/30/2016   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 90 tablet 2   buPROPion (WELLBUTRIN XL) 300 MG 24 hr tablet Take 300 mg by mouth daily.     clopidogrel (PLAVIX) 75 MG tablet TAKE 1 TABLET BY MOUTH EVERY DAY 90 tablet 2   escitalopram (LEXAPRO) 5 MG tablet Take 10 mg by mouth daily.  0   lamoTRIgine (LAMICTAL) 25 MG tablet TAKE 1 TABLET DAILY FOR TWO WEEKS, THEN INCREASE TO 1 TABLET TWICE DAILY (Patient taking differently: Take 25 mg by mouth daily. Take 1 tablet daily for two weeks, then increase to 1 tablet twice daily) 180 tablet 1   lisdexamfetamine (VYVANSE) 40 MG capsule Take 40 mg by mouth every morning.     NON FORMULARY Apply 50 mg topically daily. Testosterone '50mg'$ /ml  0   propranolol ER (INDERAL LA) 80 MG 24 hr capsule Take 1 capsule (80 mg total) by mouth daily. 90 capsule 3   ranolazine (RANEXA) 500 MG 12 hr tablet Take 1 tablet (500 mg total) by mouth 2 (two) times daily. 60 tablet 6   Current Facility-Administered Medications on File Prior to Visit  Medication Dose Route Frequency Provider Last Rate Last Admin   sodium chloride flush (NS) 0.9 % injection 10 mL  10 mL Intravenous PRN Cerina Leary R, DO   20 mL at 03/20/20 1125    ALLERGIES: No Known Allergies  FAMILY HISTORY: Family History  Problem Relation Age of Onset   Lung cancer Mother    Diabetes Brother       Objective:  *** General: No acute distress.  Patient appears well-groomed.   Head:   Normocephalic/atraumatic Eyes:  Fundi examined but not visualized Neck: supple, no paraspinal tenderness, full range of motion Heart:  Regular rate and rhythm Neurological Exam: alert and oriented to person, place, and time.  Speech fluent and not dysarthric, language intact.  CN II-XII intact. Bulk and tone normal, muscle strength 5/5 throughout.  Sensation to light touch intact.  Deep tendon reflexes 2+ throughout.  Finger to nose testing demonstrates bilateral kinetic tremor in hands.  Gait normal, Romberg negative.    Metta Clines, DO  CC: Shirline Frees, MD

## 2022-11-18 ENCOUNTER — Ambulatory Visit (INDEPENDENT_AMBULATORY_CARE_PROVIDER_SITE_OTHER): Payer: Medicare PPO | Admitting: Neurology

## 2022-11-18 ENCOUNTER — Encounter: Payer: Self-pay | Admitting: Neurology

## 2022-11-18 VITALS — BP 102/48 | HR 51 | Ht 70.0 in | Wt 162.2 lb

## 2022-11-18 DIAGNOSIS — I6521 Occlusion and stenosis of right carotid artery: Secondary | ICD-10-CM

## 2022-11-18 DIAGNOSIS — E785 Hyperlipidemia, unspecified: Secondary | ICD-10-CM | POA: Diagnosis not present

## 2022-11-18 DIAGNOSIS — G459 Transient cerebral ischemic attack, unspecified: Secondary | ICD-10-CM

## 2022-11-18 DIAGNOSIS — G250 Essential tremor: Secondary | ICD-10-CM | POA: Diagnosis not present

## 2022-11-18 DIAGNOSIS — F341 Dysthymic disorder: Secondary | ICD-10-CM | POA: Diagnosis not present

## 2022-11-18 DIAGNOSIS — M62838 Other muscle spasm: Secondary | ICD-10-CM | POA: Diagnosis not present

## 2022-11-18 MED ORDER — LAMOTRIGINE 25 MG PO TABS: 50.0000 mg | ORAL_TABLET | Freq: Two times a day (BID) | ORAL | 5 refills | Status: DC

## 2022-11-18 NOTE — Patient Instructions (Signed)
Increase lamotrigine '25mg'$  tablet to 2 tablets ('50mg'$ ) twice daily

## 2022-11-22 DIAGNOSIS — F341 Dysthymic disorder: Secondary | ICD-10-CM | POA: Diagnosis not present

## 2022-11-25 DIAGNOSIS — F341 Dysthymic disorder: Secondary | ICD-10-CM | POA: Diagnosis not present

## 2022-11-25 NOTE — Progress Notes (Signed)
Carelink Summary Report / Loop Recorder 

## 2022-11-29 DIAGNOSIS — F341 Dysthymic disorder: Secondary | ICD-10-CM | POA: Diagnosis not present

## 2022-12-02 DIAGNOSIS — F341 Dysthymic disorder: Secondary | ICD-10-CM | POA: Diagnosis not present

## 2022-12-06 DIAGNOSIS — F341 Dysthymic disorder: Secondary | ICD-10-CM | POA: Diagnosis not present

## 2022-12-08 ENCOUNTER — Other Ambulatory Visit: Payer: Self-pay | Admitting: Cardiology

## 2022-12-09 DIAGNOSIS — F341 Dysthymic disorder: Secondary | ICD-10-CM | POA: Diagnosis not present

## 2022-12-13 DIAGNOSIS — F341 Dysthymic disorder: Secondary | ICD-10-CM | POA: Diagnosis not present

## 2022-12-16 DIAGNOSIS — F341 Dysthymic disorder: Secondary | ICD-10-CM | POA: Diagnosis not present

## 2022-12-20 ENCOUNTER — Ambulatory Visit (INDEPENDENT_AMBULATORY_CARE_PROVIDER_SITE_OTHER): Payer: Medicare PPO

## 2022-12-20 DIAGNOSIS — G459 Transient cerebral ischemic attack, unspecified: Secondary | ICD-10-CM | POA: Diagnosis not present

## 2022-12-20 DIAGNOSIS — F341 Dysthymic disorder: Secondary | ICD-10-CM | POA: Diagnosis not present

## 2022-12-21 LAB — CUP PACEART REMOTE DEVICE CHECK
Date Time Interrogation Session: 20240414230928
Implantable Pulse Generator Implant Date: 20210803

## 2022-12-23 DIAGNOSIS — F341 Dysthymic disorder: Secondary | ICD-10-CM | POA: Diagnosis not present

## 2022-12-27 DIAGNOSIS — F341 Dysthymic disorder: Secondary | ICD-10-CM | POA: Diagnosis not present

## 2022-12-27 NOTE — Progress Notes (Signed)
Carelink Summary Report / Loop Recorder 

## 2022-12-30 DIAGNOSIS — F341 Dysthymic disorder: Secondary | ICD-10-CM | POA: Diagnosis not present

## 2023-01-03 DIAGNOSIS — F341 Dysthymic disorder: Secondary | ICD-10-CM | POA: Diagnosis not present

## 2023-01-06 DIAGNOSIS — F341 Dysthymic disorder: Secondary | ICD-10-CM | POA: Diagnosis not present

## 2023-01-10 ENCOUNTER — Ambulatory Visit: Payer: Medicare PPO | Attending: Cardiology | Admitting: Cardiology

## 2023-01-10 ENCOUNTER — Encounter: Payer: Self-pay | Admitting: Cardiology

## 2023-01-10 VITALS — BP 112/62 | HR 56 | Ht 70.5 in | Wt 160.0 lb

## 2023-01-10 DIAGNOSIS — F341 Dysthymic disorder: Secondary | ICD-10-CM | POA: Diagnosis not present

## 2023-01-10 DIAGNOSIS — R251 Tremor, unspecified: Secondary | ICD-10-CM

## 2023-01-10 DIAGNOSIS — Z951 Presence of aortocoronary bypass graft: Secondary | ICD-10-CM | POA: Diagnosis not present

## 2023-01-10 DIAGNOSIS — E785 Hyperlipidemia, unspecified: Secondary | ICD-10-CM | POA: Diagnosis not present

## 2023-01-10 DIAGNOSIS — I2510 Atherosclerotic heart disease of native coronary artery without angina pectoris: Secondary | ICD-10-CM | POA: Diagnosis not present

## 2023-01-10 DIAGNOSIS — I739 Peripheral vascular disease, unspecified: Secondary | ICD-10-CM

## 2023-01-10 DIAGNOSIS — I6523 Occlusion and stenosis of bilateral carotid arteries: Secondary | ICD-10-CM

## 2023-01-10 NOTE — Progress Notes (Unsigned)
Cardiology Office Note:    Date:  01/10/2023   ID:  Zachary Ponce, DOB August 13, 1951, MRN [id number]  PCP:  [name], MD  Cardiologist:  [name], MD    Referring MD: [name], MD   Chief Complaint  Patient presents with   Extremity Weakness    History of Present Illness:    Zachary Ponce is a 72 y.o. 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  He does have implantable loop recorder, no arrhythmia detected, essential hypertension, dyslipidemia, bradycardia.  Comes today to months for follow-up overall seems to be doing well.  He denies fever chest pain tightness squeezing pressure burning chest but does complain of vaginal leg pain.  And also weakness in the legs.  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   Bilateral carotid artery disease (HCC) 03/07/2018   Left CEA 03/08/18 at time of CABG    CAD (coronary artery disease) 03/03/2018   Cardiac cath  03/03/18 70% distal left main, 99% proximal LAD, 40-50% proximal circumflex, 60% intermediate, 90% ostial right coronary artery, 55% LVEF with some mild distal anterior hypokinesis   Depression 06/30/2016   Dyslipidemia 07/24/2015   The 10-year ASCVD risk score [name], et al., 2013) is: 15.7%   Values used to calculate the score:     Age: 31 years     Sex: Male     Is Non-Hispanic African American: No     Diabetic: No     Tobacco smoker: No     Systolic Blood Pressure: 114 mmHg     Is BP treated: No     HDL Cholesterol: 32 mg/dL     Total Cholesterol: 209 mg/dL The 72-ZDGU ASCVD risk score [name], et al., 2013) is   Essential tremor     Generalized anxiety disorder 01/22/2016   History of traumatic head injury    Hyperlipidemia 03/03/2018   Hypogonadism in male 06/20/2018   Hypotension after procedure 06/22/2018   Memory loss 06/30/2016   On amiodarone therapy 06/22/2018   PAF (paroxysmal atrial fibrillation) (HCC) 06/22/2018   Post concussion syndrome 06/30/2016   Post-concussion headache 06/30/2016   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 LIMA TO LAD, SVG TO OM, SVG TO RAMUS, SVG TO PD.;  Surgeon: [name], MD;  Location: [facility] OR;  Service: Open Heart Surgery;  Laterality: N/A;   ENDARTERECTOMY Left 03/08/2018   Procedure: ENDARTERECTOMY CAROTID;  Surgeon: [name], MD;  Location: [facility] OR;  Service: Vascular;  Laterality: Left;   KNEE SURGERY     LEFT HEART CATH AND CORONARY ANGIOGRAPHY N/A 03/03/2018   Procedure: LEFT HEART CATH AND CORONARY ANGIOGRAPHY;  Surgeon: [name], MD;  Location: [facility] INVASIVE CV LAB;  Service: Cardiovascular;  Laterality: N/A;   TEE WITHOUT CARDIOVERSION N/A 03/08/2018   Procedure: TRANSESOPHAGEAL ECHOCARDIOGRAM (TEE);  Surgeon: [name], [name], MD;  Location: [facility]  OR;  Service: Open Heart Surgery;  Laterality: N/A;   TONSILLECTOMY      Current Medications: Current Meds  Medication Sig   atorvastatin (LIPITOR) 20 MG tablet Take 1 tablet (20 mg total) by mouth daily.   buPROPion (WELLBUTRIN XL) 300 MG 24 hr tablet Take 300 mg by mouth daily.   clopidogrel (PLAVIX) 75 MG tablet TAKE 1 TABLET BY MOUTH EVERY DAY   escitalopram (LEXAPRO) 5 MG tablet Take 10 mg by mouth daily.   lisdexamfetamine (VYVANSE) 40 MG capsule Take 40 mg by mouth every morning.   NON FORMULARY Apply 50 mg topically daily. Testosterone 50mg /ml   propranolol ER (INDERAL LA) 80  MG 24 hr capsule TAKE 1 CAPSULE BY MOUTH EVERY DAY (Patient taking differently: Take 80 mg by mouth daily.)     Allergies:   Patient has no known allergies.   Social History   Socioeconomic History   Marital status: Married    Spouse name: Not on file   Number of children: Not on file   Years of education: Not on file   Highest education level: Not on file  Occupational History   Not on file  Tobacco Use   Smoking status: Never    Passive exposure: Never   Smokeless tobacco: Never  Vaping Use   Vaping Use: Never used  Substance and Sexual Activity   Alcohol use: Not Currently   Drug use: Never   Sexual activity: Yes  Other Topics Concern   Not on file  Social History Narrative   Left handed   3 story home   Drink decaffaine   Social Determinants of Health   Financial Resource Strain: Not on file  Food Insecurity: No Food Insecurity (07/20/2021)   Hunger Vital Sign    Worried About Running Out of Food in the Last Year: Never true    Ran Out of Food in the Last Year: Never true  Transportation Needs: Not on file  Physical Activity: Not on file  Stress: Not on file  Social Connections: Not on file     Family History: The patient's family history includes Diabetes in his brother; Lung cancer in his mother. ROS:   Please see the history of present illness.    All 14 point review of systems negative except as described per history of present illness  EKGs/Labs/Other Studies Reviewed:      Recent Labs: No results found for requested labs within last 365 days.  Recent Lipid Panel    Component Value Date/Time   CHOL 112 04/07/2022 1531   TRIG 120 04/07/2022 1531   HDL 39 (L) 04/07/2022 1531   CHOLHDL 2.9 04/07/2022 1531   LDLCALC 51 04/07/2022 1531    Physical Exam:    VS:  BP 112/62 (BP Location: Left Arm, Patient Position: Sitting)   Pulse (!) 56   Ht 5' 10.5" (1.791 m)   Wt 160 lb (72.6 kg)   SpO2 99%   BMI 22.63 kg/m     Wt Readings from Last 3  Encounters:  01/10/23 160 lb (72.6 kg)  11/18/22 162 lb 3.2 oz (73.6 kg)  09/17/22 154 lb (69.9 kg)     GEN:  Well nourished, well developed in no acute distress HEENT: Normal NECK: No JVD; No carotid bruits LYMPHATICS: No lymphadenopathy CARDIAC: RRR, no murmurs, no rubs, no gallops RESPIRATORY:  Clear to auscultation without rales, wheezing or rhonchi  ABDOMEN: Soft, non-tender, non-distended MUSCULOSKELETAL:  No edema; No deformity  SKIN: Warm and dry  LOWER EXTREMITIES: no swelling NEUROLOGIC:  Alert and oriented x 3 PSYCHIATRIC:  Normal affect   ASSESSMENT:    1. Coronary artery disease involving native coronary artery of native heart without angina pectoris   2. Bilateral carotid artery stenosis   3. Dyslipidemia   4. S/P CABG x 4   5. Tremor    PLAN:    In order of problems listed above:  Coronary disease stable from that point review on appropriate medication which I will continue.  He is on antiplatelet therapy in form of Plavix. Dyslipidemia I did review K PN he is taking moderate intense statin for of Lipitor 20 he is LDL 51 HDL 39 this is from summer of last year continue monitoring. Bilateral carotic artery stenosis, recently seen vascular surgeon no need to intervene. Tremor followed by neurology.  He is getting long acting propranolol. Lower extremity weakness and fatigue will schedule him to have segmental pressures   Medication Adjustments/Labs and Tests Ordered: Current medicines are reviewed at length with the patient today.  Concerns regarding medicines are outlined above.  No orders of the defined types were placed in this encounter.  Medication changes: No orders of the defined types were placed in this encounter.   Signed, Georgeanna Lea, MD, [facility] 01/10/2023 4:36 PM     [facility]

## 2023-01-10 NOTE — Patient Instructions (Addendum)
Medication Instructions:  Your physician recommends that you continue on your current medications as directed. Please refer to the Current Medication list given to you today.  *If you need a refill on your cardiac medications before your next appointment, please call your pharmacy*   Lab Work: None Ordered If you have labs (blood work) drawn today and your tests are completely normal, you will receive your results only by: MyChart Message (if you have MyChart) OR A paper copy in the mail If you have any lab test that is abnormal or we need to change your treatment, we will call you to review the results.   Testing/Procedures: Your physician has requested that you have a lower extremity arterial duplex. This test is an ultrasound of the arteries in the legs. It looks at arterial blood flow in the legs. Allow one hour for Lower Arterial scans. There are no restrictions or special instructions    Follow-Up: At [facility], you and your health needs are our priority.  As part of our continuing mission to provide you with exceptional heart care, we have created designated Provider Care Teams.  These Care Teams include your primary Cardiologist (physician) and Advanced Practice Providers (APPs -  Physician Assistants and Nurse Practitioners) who all work together to provide you with the care you need, when you need it.  We recommend signing up for the patient portal called "MyChart".  Sign up information is provided on this After Visit Summary.  MyChart is used to connect with patients for Virtual Visits (Telemedicine).  Patients are able to view lab/test results, encounter notes, upcoming appointments, etc.  Non-urgent messages can be sent to your provider as well.   To learn more about what you can do with MyChart, go to [email or web address].    Your next appointment:   6 month(s)  The format for your next appointment:   In Person  Provider:   Robert Krasowski, MD    Other  Instructions NA  

## 2023-01-11 ENCOUNTER — Other Ambulatory Visit: Payer: Self-pay | Admitting: Cardiology

## 2023-01-11 DIAGNOSIS — I739 Peripheral vascular disease, unspecified: Secondary | ICD-10-CM

## 2023-01-13 DIAGNOSIS — F341 Dysthymic disorder: Secondary | ICD-10-CM | POA: Diagnosis not present

## 2023-01-14 ENCOUNTER — Ambulatory Visit (HOSPITAL_COMMUNITY): Admission: RE | Admit: 2023-01-14 | Payer: Medicare PPO | Source: Ambulatory Visit

## 2023-01-17 DIAGNOSIS — F341 Dysthymic disorder: Secondary | ICD-10-CM | POA: Diagnosis not present

## 2023-01-19 ENCOUNTER — Other Ambulatory Visit: Payer: Self-pay | Admitting: Cardiology

## 2023-01-20 DIAGNOSIS — F341 Dysthymic disorder: Secondary | ICD-10-CM | POA: Diagnosis not present

## 2023-01-24 ENCOUNTER — Ambulatory Visit (INDEPENDENT_AMBULATORY_CARE_PROVIDER_SITE_OTHER): Payer: Medicare PPO

## 2023-01-24 DIAGNOSIS — F341 Dysthymic disorder: Secondary | ICD-10-CM | POA: Diagnosis not present

## 2023-01-24 DIAGNOSIS — G459 Transient cerebral ischemic attack, unspecified: Secondary | ICD-10-CM | POA: Diagnosis not present

## 2023-01-24 LAB — CUP PACEART REMOTE DEVICE CHECK
Date Time Interrogation Session: 20240517230218
Implantable Pulse Generator Implant Date: 20210803

## 2023-01-25 NOTE — Progress Notes (Signed)
Carelink Summary Report / Loop Recorder 

## 2023-01-27 ENCOUNTER — Encounter (HOSPITAL_COMMUNITY): Payer: Medicare PPO

## 2023-01-27 DIAGNOSIS — F341 Dysthymic disorder: Secondary | ICD-10-CM | POA: Diagnosis not present

## 2023-01-28 ENCOUNTER — Ambulatory Visit (HOSPITAL_COMMUNITY)
Admission: RE | Admit: 2023-01-28 | Discharge: 2023-01-28 | Disposition: A | Discharge status: Home / Self Care | Payer: Medicare PPO | Source: Ambulatory Visit | Attending: Cardiology | Admitting: Cardiology

## 2023-01-28 DIAGNOSIS — I739 Peripheral vascular disease, unspecified: Secondary | ICD-10-CM

## 2023-01-28 LAB — VAS US LOWER EXT ART SEG MULTI (SEGMENTALS & LE RAYNAUDS)
Left ABI: 1.17
Right ABI: 1.02

## 2023-02-03 DIAGNOSIS — F341 Dysthymic disorder: Secondary | ICD-10-CM | POA: Diagnosis not present

## 2023-02-07 DIAGNOSIS — F341 Dysthymic disorder: Secondary | ICD-10-CM | POA: Diagnosis not present

## 2023-02-10 DIAGNOSIS — F341 Dysthymic disorder: Secondary | ICD-10-CM | POA: Diagnosis not present

## 2023-02-14 DIAGNOSIS — F341 Dysthymic disorder: Secondary | ICD-10-CM | POA: Diagnosis not present

## 2023-02-17 DIAGNOSIS — F341 Dysthymic disorder: Secondary | ICD-10-CM | POA: Diagnosis not present

## 2023-02-21 DIAGNOSIS — F341 Dysthymic disorder: Secondary | ICD-10-CM | POA: Diagnosis not present

## 2023-02-21 NOTE — Progress Notes (Signed)
Carelink Summary Report / Loop Recorder 

## 2023-02-24 DIAGNOSIS — F341 Dysthymic disorder: Secondary | ICD-10-CM | POA: Diagnosis not present

## 2023-02-28 ENCOUNTER — Ambulatory Visit (INDEPENDENT_AMBULATORY_CARE_PROVIDER_SITE_OTHER): Payer: Medicare PPO

## 2023-02-28 DIAGNOSIS — G459 Transient cerebral ischemic attack, unspecified: Secondary | ICD-10-CM | POA: Diagnosis not present

## 2023-02-28 DIAGNOSIS — F341 Dysthymic disorder: Secondary | ICD-10-CM | POA: Diagnosis not present

## 2023-02-28 LAB — CUP PACEART REMOTE DEVICE CHECK
Date Time Interrogation Session: 20240623230636
Implantable Pulse Generator Implant Date: 20210803

## 2023-03-03 DIAGNOSIS — F341 Dysthymic disorder: Secondary | ICD-10-CM | POA: Diagnosis not present

## 2023-03-07 DIAGNOSIS — F341 Dysthymic disorder: Secondary | ICD-10-CM | POA: Diagnosis not present

## 2023-03-14 DIAGNOSIS — F341 Dysthymic disorder: Secondary | ICD-10-CM | POA: Diagnosis not present

## 2023-03-17 ENCOUNTER — Ambulatory Visit (HOSPITAL_COMMUNITY)
Admission: RE | Admit: 2023-03-17 | Discharge: 2023-03-17 | Disposition: A | Discharge status: Home / Self Care | Payer: Medicare PPO | Source: Ambulatory Visit | Attending: Vascular Surgery | Admitting: Vascular Surgery

## 2023-03-17 ENCOUNTER — Encounter: Payer: Self-pay | Admitting: Physician Assistant

## 2023-03-17 ENCOUNTER — Ambulatory Visit: Payer: Medicare PPO | Admitting: Physician Assistant

## 2023-03-17 VITALS — BP 123/60 | HR 50 | Temp 98.7°F | Resp 20 | Ht 70.5 in | Wt 160.0 lb

## 2023-03-17 DIAGNOSIS — I6523 Occlusion and stenosis of bilateral carotid arteries: Secondary | ICD-10-CM | POA: Diagnosis not present

## 2023-03-17 DIAGNOSIS — F341 Dysthymic disorder: Secondary | ICD-10-CM | POA: Diagnosis not present

## 2023-03-17 NOTE — Progress Notes (Signed)
History of Present Illness:  Patient is a 72 y.o. year old male who presents for evaluation of carotid stenosis.  He has hx of combination CABG and left CEA with Dr. Alla German and Dr. Darrick Penna in July 2019.  Originally he had a scan that stated he had high grade stenosis B ICA.  On follow up the right ICA has been stable and the left ICA has had no evidence of restenosis.    He is medically managed on ASA.   The patient denies symptoms of TIA, amaurosis, or stroke.  He stays busy and just returned from a beach trip.       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   Bilateral carotid artery disease (HCC) 03/07/2018   Left CEA 03/08/18 at time of CABG    CAD (coronary artery disease) 03/03/2018   Cardiac cath  03/03/18 70% distal left main, 99% proximal LAD, 40-50% proximal circumflex, 60% intermediate, 90% ostial right coronary artery, 55% LVEF with some mild distal anterior hypokinesis   Depression 06/30/2016   Dyslipidemia 07/24/2015   The 10-year ASCVD risk score [name], et al., 2013) is: 15.7%   Values used to calculate the score:     Age: 41 years     Sex: Male     Is Non-Hispanic African American: No     Diabetic: No     Tobacco smoker: No     Systolic Blood Pressure: 114 mmHg     Is BP treated: No     HDL Cholesterol: 32 mg/dL     Total Cholesterol: 209 mg/dL The 16-XWRU ASCVD risk score [name], et al., 2013) is   Essential tremor    Generalized anxiety disorder 01/22/2016   History of traumatic head injury    Hyperlipidemia 03/03/2018   Hypogonadism in male 06/20/2018   Hypotension after procedure 06/22/2018   Memory loss 06/30/2016   On amiodarone therapy 06/22/2018   PAF (paroxysmal atrial fibrillation) (HCC) 06/22/2018   Post concussion syndrome 06/30/2016   Post-concussion headache 06/30/2016   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 LIMA TO LAD, SVG TO OM, SVG TO RAMUS, SVG TO PD.;  Surgeon: [name], MD;  Location: [facility] OR;  Service: Open Heart Surgery;  Laterality: N/A;   ENDARTERECTOMY Left 03/08/2018   Procedure: ENDARTERECTOMY CAROTID;  Surgeon: [name], MD;  Location: [facility] OR;  Service: Vascular;  Laterality: Left;   KNEE SURGERY     LEFT HEART CATH AND CORONARY ANGIOGRAPHY N/A 03/03/2018   Procedure: LEFT HEART CATH AND CORONARY ANGIOGRAPHY;  Surgeon: [name], MD;  Location: [facility] INVASIVE CV LAB;  Service: Cardiovascular;  Laterality: N/A;   TEE WITHOUT CARDIOVERSION N/A 03/08/2018   Procedure: TRANSESOPHAGEAL ECHOCARDIOGRAM (TEE);  Surgeon: [name], [name], MD;  Location: [facility] OR;  Service: Open Heart Surgery;  Laterality: N/A;   TONSILLECTOMY       Social History Social History   Tobacco Use   Smoking status: Never    Passive exposure: Never   Smokeless tobacco: Never  Vaping Use   Vaping status: Never Used  Substance Use Topics   Alcohol use: Not Currently  Drug use: Never    Family History Family History  Problem Relation Age of Onset   Lung cancer Mother    Diabetes Brother     Allergies  No Known Allergies   Current Outpatient Medications  Medication Sig Dispense Refill   atorvastatin (LIPITOR) 20 MG tablet Take 1 tablet (20 mg total) by mouth daily. 90 tablet 2   buPROPion (WELLBUTRIN XL) 300 MG 24 hr tablet Take 300 mg by mouth daily.     clopidogrel (PLAVIX) 75 MG tablet Take 1 tablet (75 mg total) by mouth daily. 90 tablet 3   escitalopram (LEXAPRO) 5 MG tablet Take 10 mg by mouth daily.  0   lisdexamfetamine (VYVANSE) 40 MG capsule Take 40 mg by mouth every morning.     NON FORMULARY Apply 50 mg topically daily. Testosterone 50mg /ml  0   propranolol ER (INDERAL LA) 80 MG 24  hr capsule TAKE 1 CAPSULE BY MOUTH EVERY DAY (Patient taking differently: Take 80 mg by mouth daily.) 90 capsule 3   No current facility-administered medications for this visit.   Facility-Administered Medications Ordered in Other Visits  Medication Dose Route Frequency Provider Last Rate Last Admin   sodium chloride flush (NS) 0.9 % injection 10 mL  10 mL Intravenous PRN Jaffe, Adam R, DO   20 mL at 03/20/20 1125    ROS:   General:  No weight loss, Fever, chills  HEENT: No recent headaches, no nasal bleeding, no visual changes, no sore throat  Neurologic: No dizziness, blackouts, seizures. No recent symptoms of stroke or mini- stroke. No recent episodes of slurred speech, or temporary blindness.  Cardiac: No recent episodes of chest pain/pressure, no shortness of breath at rest.  No shortness of breath with exertion.  Denies history of atrial fibrillation or irregular heartbeat  Vascular: No history of rest pain in feet.  No history of claudication.  No history of non-healing ulcer, No history of DVT   Pulmonary: No home oxygen, no productive cough, no hemoptysis,  No asthma or wheezing  Musculoskeletal:  [ ]  Arthritis, [ ]  Low back pain,  [ ]  Joint pain  Hematologic:No history of hypercoagulable state.  No history of easy bleeding.  No history of anemia  Gastrointestinal: No hematochezia or melena,  No gastroesophageal reflux, no trouble swallowing  Urinary: [ ]  chronic Kidney disease, [ ]  on HD - [ ]  MWF or [ ]  TTHS, [ ]  Burning with urination, [ ]  Frequent urination, [ ]  Difficulty urinating;   Skin: No rashes  Psychological: positive history of anxiety,  No history of depression   Physical Examination  Vitals:   03/17/23 1031  BP: 124/67  Pulse: (!) 50  Resp: 20  Temp: 98.7 F (37.1 C)  SpO2: 98%  Weight: 160 lb (72.6 kg)  Height: 5' 10.5" (1.791 m)    Body mass index is 22.63 kg/m.  General:  Alert and oriented, no acute distress HEENT: Normal Neck: No  bruit or JVD Pulmonary: Clear to auscultation bilaterally Cardiac: Regular Rate and Rhythm without murmur Gastrointestinal: Soft, non-tender, non-distended, no mass, no scars Skin: No rash Extremity Pulses:   Musculoskeletal: No deformity or edema  Neurologic: Upper and lower extremity motor 5/5 and symmetric  DATA:   Right Carotid Findings:  +----------+--------+--------+--------+---------------------+--------------  ----+           PSV cm/sEDV cm/sStenosisPlaque Description   Comments             +----------+--------+--------+--------+---------------------+--------------  ----+  CCA Prox  101     7                                    intimal  thickening  +----------+--------+--------+--------+---------------------+--------------  ----+  CCA Distal79      13                                                        +----------+--------+--------+--------+---------------------+--------------  ----+  ICA Prox  262     52      40-59%  heterogenous,                                                               irregular and                                                               calcific                                  +----------+--------+--------+--------+---------------------+--------------  ----+  ICA Mid   105     21                                                        +----------+--------+--------+--------+---------------------+--------------  ----+  ICA Distal50      13                                                        +----------+--------+--------+--------+---------------------+--------------  ----+  ECA      141                                                               +----------+--------+--------+--------+---------------------+--------------  ----+   +----------+--------+-------+--------+-------------------+           PSV cm/sEDV cmsDescribeArm Pressure (mmHG)   +----------+--------+-------+--------+-------------------+  [id number]           Stenotic121                  +----------+--------+-------+--------+-------------------+   +---------+--------+--+--------+-+---------+  VertebralPSV cm/s37EDV cm/s4Antegrade  +---------+--------+--+--------+-+---------+      Left Carotid Findings:  +----------+--------+--------+--------+------------------+-----------------  -+           PSV cm/sEDV cm/sStenosisPlaque DescriptionComments             +----------+--------+--------+--------+------------------+-----------------  -+  CCA Prox  118     14                                                     +----------+--------+--------+--------+------------------+-----------------  -+  CCA Distal83      13                                intimal  thickening  +----------+--------+--------+--------+------------------+-----------------  -+  ICA Prox  60      12      1-39%                     intimal  thickening  +----------+--------+--------+--------+------------------+-----------------  -+  ICA Mid   75      19                                                     +----------+--------+--------+--------+------------------+-----------------  -+  ICA Distal83      19                                                     +----------+--------+--------+--------+------------------+-----------------  -+  ECA      83                                                             +----------+--------+--------+--------+------------------+-----------------  -+   +----------+--------+--------+----------------+-------------------+           PSV cm/sEDV cm/sDescribe        Arm Pressure (mmHG)  +----------+--------+--------+----------------+-------------------+  [id number]            Multiphasic, [id number]                  +----------+--------+--------+----------------+-------------------+    +---------+--------+--+--------+--+---------+  VertebralPSV cm/s99EDV cm/s17Antegrade  +---------+--------+--+--------+--+---------+         Summary:  Right Carotid: Velocities in the right ICA are consistent with a 40-59%                 stenosis.   Left Carotid: Velocities in the left ICA are consistent with a 1-39%  stenosis.   Vertebrals: Bilateral vertebral arteries demonstrate antegrade flow.  Subclavians: Right subclavian artery was stenotic. Normal flow  hemodynamics were               seen in the left subclavian artery.    ASSESSMENT/PLAN: History of CAD and left > 80% stenosis ICA s/p combination CABG and CEA on the left by Dr. Darrick Penna on 03/08/2018.             The duplex demonstrates no new re current ICA stenosis on the left and 60-79% stenosis asymptomatic on the right ICA.  The PSV is 52 compared to 81 last visit.   He remains asymptomatic and the study  is < 80 % stenosis.  The left ICA is < 39% stenosis.  He will return in 6 months for repeat surveillance.       Mosetta Pigeon PA-C [facility] Office: [phone]  MD in clinic [place]

## 2023-03-18 NOTE — Progress Notes (Signed)
Carelink Summary Report / Loop Recorder 

## 2023-03-19 ENCOUNTER — Other Ambulatory Visit: Payer: Self-pay

## 2023-03-19 DIAGNOSIS — I6523 Occlusion and stenosis of bilateral carotid arteries: Secondary | ICD-10-CM

## 2023-03-21 DIAGNOSIS — F341 Dysthymic disorder: Secondary | ICD-10-CM | POA: Diagnosis not present

## 2023-03-22 DIAGNOSIS — G250 Essential tremor: Secondary | ICD-10-CM | POA: Diagnosis not present

## 2023-03-22 DIAGNOSIS — Z125 Encounter for screening for malignant neoplasm of prostate: Secondary | ICD-10-CM | POA: Diagnosis not present

## 2023-03-22 DIAGNOSIS — F909 Attention-deficit hyperactivity disorder, unspecified type: Secondary | ICD-10-CM | POA: Diagnosis not present

## 2023-03-22 DIAGNOSIS — I6523 Occlusion and stenosis of bilateral carotid arteries: Secondary | ICD-10-CM | POA: Diagnosis not present

## 2023-03-22 DIAGNOSIS — I25119 Atherosclerotic heart disease of native coronary artery with unspecified angina pectoris: Secondary | ICD-10-CM | POA: Diagnosis not present

## 2023-03-22 DIAGNOSIS — E291 Testicular hypofunction: Secondary | ICD-10-CM | POA: Diagnosis not present

## 2023-03-22 DIAGNOSIS — F419 Anxiety disorder, unspecified: Secondary | ICD-10-CM | POA: Diagnosis not present

## 2023-03-24 DIAGNOSIS — F341 Dysthymic disorder: Secondary | ICD-10-CM | POA: Diagnosis not present

## 2023-03-28 DIAGNOSIS — F341 Dysthymic disorder: Secondary | ICD-10-CM | POA: Diagnosis not present

## 2023-03-31 DIAGNOSIS — F341 Dysthymic disorder: Secondary | ICD-10-CM | POA: Diagnosis not present

## 2023-04-04 ENCOUNTER — Ambulatory Visit (INDEPENDENT_AMBULATORY_CARE_PROVIDER_SITE_OTHER): Payer: Medicare PPO

## 2023-04-04 DIAGNOSIS — G459 Transient cerebral ischemic attack, unspecified: Secondary | ICD-10-CM | POA: Diagnosis not present

## 2023-04-04 DIAGNOSIS — F341 Dysthymic disorder: Secondary | ICD-10-CM | POA: Diagnosis not present

## 2023-04-07 DIAGNOSIS — F341 Dysthymic disorder: Secondary | ICD-10-CM | POA: Diagnosis not present

## 2023-04-11 DIAGNOSIS — F341 Dysthymic disorder: Secondary | ICD-10-CM | POA: Diagnosis not present

## 2023-04-13 DIAGNOSIS — F341 Dysthymic disorder: Secondary | ICD-10-CM | POA: Diagnosis not present

## 2023-04-18 DIAGNOSIS — F341 Dysthymic disorder: Secondary | ICD-10-CM | POA: Diagnosis not present

## 2023-04-20 NOTE — Progress Notes (Signed)
Carelink Summary Report / Loop Recorder 

## 2023-04-21 DIAGNOSIS — F341 Dysthymic disorder: Secondary | ICD-10-CM | POA: Diagnosis not present

## 2023-04-25 DIAGNOSIS — F341 Dysthymic disorder: Secondary | ICD-10-CM | POA: Diagnosis not present

## 2023-04-28 DIAGNOSIS — F341 Dysthymic disorder: Secondary | ICD-10-CM | POA: Diagnosis not present

## 2023-05-02 DIAGNOSIS — F341 Dysthymic disorder: Secondary | ICD-10-CM | POA: Diagnosis not present

## 2023-05-04 DIAGNOSIS — E7800 Pure hypercholesterolemia, unspecified: Secondary | ICD-10-CM | POA: Diagnosis not present

## 2023-05-05 DIAGNOSIS — F341 Dysthymic disorder: Secondary | ICD-10-CM | POA: Diagnosis not present

## 2023-05-05 LAB — CUP PACEART REMOTE DEVICE CHECK
Date Time Interrogation Session: 20240828230628
Implantable Pulse Generator Implant Date: 20210803

## 2023-05-10 ENCOUNTER — Ambulatory Visit (INDEPENDENT_AMBULATORY_CARE_PROVIDER_SITE_OTHER): Payer: Medicare PPO

## 2023-05-10 DIAGNOSIS — G459 Transient cerebral ischemic attack, unspecified: Secondary | ICD-10-CM

## 2023-05-12 DIAGNOSIS — F341 Dysthymic disorder: Secondary | ICD-10-CM | POA: Diagnosis not present

## 2023-05-16 DIAGNOSIS — F341 Dysthymic disorder: Secondary | ICD-10-CM | POA: Diagnosis not present

## 2023-05-16 NOTE — Progress Notes (Deleted)
NEUROLOGY FOLLOW UP OFFICE NOTE  BRANTLY FREER [id number]  Assessment/Plan:   Essential tremor Right internal carotid artery stenosis Dyslipidemia History of transient ischemic attack   1.For tremor:  Lamotrigine 50mg  BID.  On propranolol ER 80mg  by cardiology.  Due to hypotension and bradycardia, would not increase.  2. Secondary stroke prevention as managed by PCP and cardiology: Plavix 75mg  daily -  Statin therapy.  LDL goal less than 70 -  Glycemic control.  Hgb A1c goal less than 7 -  Normotensive blood pressure (low blood pressure and pulse - denies feeling lightheaded.  Propranolol prescribed by cardiology, they are aware) 3. Carotid stenosis monitored by vascular surgery 4. A fib monitoring with loop recorder 5. Follow up 6 months..   Subjective:  Clearance Christe is a 72 year old ambidextrous Caucasian male with CAD s/p coronary artery bypass grafting, left carotid artery disease s/p CEA, HLD, depression and anxiety, essential tremor and history of traumatic brain injury and TIA who follows up for essential tremor.   UPDATE: Current medications:  Plavix 75mg  daily, atorvastatin 20mg  daily, propranolol ER 80mg  daily, lamotrigine 50mg  BID  Increased lamotrigine to 50mg  BID.  ***   He is with implantable loop recorder.  No atrial fibrillation has yet been identified.    Repeat bilateral carotid Duplex on 03/17/2023 showed stable 40-59% right ICA stenosis and 1-39% left ICA stenosis.      HISTORY:  In late April 2021, he had an episode of dizziness, nausea, vomiting.  He was laying on the bed with his right arm laying over the side and couldn't move it for about 5 minutes.  EMS was called and blood pressure was reportedly 160 systolic and EKG reportedly normal.     Carotid ultrasound on 01/16/2020 showed 60-79% right ICA stenosis and 1-39% left ICA stenosis.  MRI of brain without contrast on 01/18/2020 personally reviewed showed mild chronic small vessel ischemic  changes and small remote left frontal infact but no acute intracranial abnormality.     CTA of head and neck from 03/17/2020 personally reviewed showed moderate M1 MCA stenoses bilaterally and 65-70% proximal right ICA stenosis and moderate stenosis at origin of the dominant right vertebral artery.  2D echocardiogram on 03/20/2020 showed EF 60-65% with no cardiac source of embolus.  Carotid ultrasound on 01/28/2021 showed right ICA 60-79% stenosis, no hemodynamically significant stenosis of left ICA.  Carotid ultrasound in January 2024 revealed stable 60-79% stenosis in the right ICA, 1-39% stenosis in the left ICA.  2D echocardiogram in February 2024demonstrated LVEF 60-65% with no regional wall motion abnormalities, trivial MVR, and aortic valve sclerosis/calcification but without stenosis and no atrial level shunt.     In 2019, he underwent left CEA for carotid artery stenosis and coronary bypass grafting.  Following procedure, he did have episodes of atrial fibrillation and was on Eliquis for a period of time.    He also has essential tremor.  Primidone caused nightmares.  Topiramate made him feel "wired".   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   Bilateral carotid artery disease (HCC) 03/07/2018   Left CEA 03/08/18 at time of CABG    CAD (coronary artery disease) 03/03/2018   Cardiac cath  03/03/18 70% distal left main, 99% proximal LAD, 40-50% proximal circumflex, 60% intermediate, 90% ostial right coronary artery, 55% LVEF with some mild distal anterior hypokinesis   Depression 06/30/2016  Dyslipidemia 07/24/2015   The 10-year ASCVD risk score [name], et al., 2013) is: 15.7%   Values used to calculate the score:     Age: 26 years     Sex: Male     Is Non-Hispanic African American: No     Diabetic: No     Tobacco smoker: No     Systolic Blood Pressure: 114 mmHg     Is BP  treated: No     HDL Cholesterol: 32 mg/dL     Total Cholesterol: 209 mg/dL The 30-QMVH ASCVD risk score [name], et al., 2013) is   Essential tremor    Generalized anxiety disorder 01/22/2016   History of traumatic head injury    Hyperlipidemia 03/03/2018   Hypogonadism in male 06/20/2018   Hypotension after procedure 06/22/2018   Memory loss 06/30/2016   On amiodarone therapy 06/22/2018   PAF (paroxysmal atrial fibrillation) (HCC) 06/22/2018   Post concussion syndrome 06/30/2016   Post-concussion headache 06/30/2016   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 90 tablet 2   buPROPion (WELLBUTRIN XL) 300 MG 24 hr tablet Take 300 mg by mouth daily.     clopidogrel (PLAVIX) 75 MG tablet Take 1 tablet (75 mg total) by mouth daily. 90 tablet 3   escitalopram (LEXAPRO) 5 MG tablet Take 10 mg by mouth daily.  0   lisdexamfetamine (VYVANSE) 40 MG capsule Take 40 mg by mouth every morning.     NON FORMULARY Apply 50 mg topically daily. Testosterone 50mg /ml  0   propranolol ER (INDERAL LA) 80 MG 24 hr capsule TAKE 1 CAPSULE BY MOUTH EVERY DAY (Patient taking differently: Take 80 mg by mouth daily.) 90 capsule 3   Current Facility-Administered Medications on File Prior to Visit  Medication Dose Route Frequency Provider Last Rate Last Admin   sodium chloride flush (NS) 0.9 % injection 10 mL  10 mL Intravenous PRN Cyncere Sontag R, DO   20 mL at 03/20/20 1125    ALLERGIES: No Known Allergies  FAMILY HISTORY: Family History  Problem Relation Age of Onset   Lung cancer Mother    Diabetes Brother       Objective:  *** General: No acute distress.  Patient appears well-groomed.   Head:  Normocephalic/atraumatic Eyes:  Fundi examined but not visualized Neck: supple, no paraspinal  tenderness, full range of motion Heart:  Regular rate and rhythm Neurological Exam: ***    Shon Millet, DO  CC: [name], MD

## 2023-05-17 ENCOUNTER — Ambulatory Visit: Payer: Medicare PPO | Admitting: Neurology

## 2023-05-19 DIAGNOSIS — F341 Dysthymic disorder: Secondary | ICD-10-CM | POA: Diagnosis not present

## 2023-05-19 NOTE — Progress Notes (Signed)
Carelink Summary Report / Loop Recorder 

## 2023-05-23 DIAGNOSIS — F341 Dysthymic disorder: Secondary | ICD-10-CM | POA: Diagnosis not present

## 2023-05-26 DIAGNOSIS — F341 Dysthymic disorder: Secondary | ICD-10-CM | POA: Diagnosis not present

## 2023-05-30 DIAGNOSIS — F341 Dysthymic disorder: Secondary | ICD-10-CM | POA: Diagnosis not present

## 2023-06-02 DIAGNOSIS — F341 Dysthymic disorder: Secondary | ICD-10-CM | POA: Diagnosis not present

## 2023-06-06 DIAGNOSIS — F341 Dysthymic disorder: Secondary | ICD-10-CM | POA: Diagnosis not present

## 2023-06-08 DIAGNOSIS — F900 Attention-deficit hyperactivity disorder, predominantly inattentive type: Secondary | ICD-10-CM | POA: Diagnosis not present

## 2023-06-08 LAB — CUP PACEART REMOTE DEVICE CHECK
Date Time Interrogation Session: 20240930231357
Implantable Pulse Generator Implant Date: 20210803

## 2023-06-09 DIAGNOSIS — F431 Post-traumatic stress disorder (PTSD): Secondary | ICD-10-CM | POA: Diagnosis not present

## 2023-06-13 ENCOUNTER — Ambulatory Visit (INDEPENDENT_AMBULATORY_CARE_PROVIDER_SITE_OTHER): Payer: Medicare PPO

## 2023-06-13 DIAGNOSIS — G459 Transient cerebral ischemic attack, unspecified: Secondary | ICD-10-CM | POA: Diagnosis not present

## 2023-06-13 DIAGNOSIS — F431 Post-traumatic stress disorder (PTSD): Secondary | ICD-10-CM | POA: Diagnosis not present

## 2023-06-16 DIAGNOSIS — F431 Post-traumatic stress disorder (PTSD): Secondary | ICD-10-CM | POA: Diagnosis not present

## 2023-06-20 DIAGNOSIS — F431 Post-traumatic stress disorder (PTSD): Secondary | ICD-10-CM | POA: Diagnosis not present

## 2023-06-23 DIAGNOSIS — F431 Post-traumatic stress disorder (PTSD): Secondary | ICD-10-CM | POA: Diagnosis not present

## 2023-06-27 DIAGNOSIS — F431 Post-traumatic stress disorder (PTSD): Secondary | ICD-10-CM | POA: Diagnosis not present

## 2023-06-28 NOTE — Progress Notes (Signed)
Carelink Summary Report / Loop Recorder 

## 2023-06-30 DIAGNOSIS — F431 Post-traumatic stress disorder (PTSD): Secondary | ICD-10-CM | POA: Diagnosis not present

## 2023-07-04 DIAGNOSIS — F431 Post-traumatic stress disorder (PTSD): Secondary | ICD-10-CM | POA: Diagnosis not present

## 2023-07-07 DIAGNOSIS — F431 Post-traumatic stress disorder (PTSD): Secondary | ICD-10-CM | POA: Diagnosis not present

## 2023-07-09 ENCOUNTER — Other Ambulatory Visit: Payer: Self-pay | Admitting: Cardiology

## 2023-07-11 DIAGNOSIS — F341 Dysthymic disorder: Secondary | ICD-10-CM | POA: Diagnosis not present

## 2023-07-13 LAB — CUP PACEART REMOTE DEVICE CHECK
Date Time Interrogation Session: 20241102230132
Implantable Pulse Generator Implant Date: 20210803

## 2023-07-14 DIAGNOSIS — F341 Dysthymic disorder: Secondary | ICD-10-CM | POA: Diagnosis not present

## 2023-07-18 ENCOUNTER — Ambulatory Visit: Payer: Medicare PPO

## 2023-07-18 DIAGNOSIS — F341 Dysthymic disorder: Secondary | ICD-10-CM | POA: Diagnosis not present

## 2023-07-18 DIAGNOSIS — G459 Transient cerebral ischemic attack, unspecified: Secondary | ICD-10-CM | POA: Diagnosis not present

## 2023-07-20 DIAGNOSIS — F341 Dysthymic disorder: Secondary | ICD-10-CM | POA: Diagnosis not present

## 2023-07-25 DIAGNOSIS — F341 Dysthymic disorder: Secondary | ICD-10-CM | POA: Diagnosis not present

## 2023-07-28 DIAGNOSIS — F341 Dysthymic disorder: Secondary | ICD-10-CM | POA: Diagnosis not present

## 2023-08-01 DIAGNOSIS — F341 Dysthymic disorder: Secondary | ICD-10-CM | POA: Diagnosis not present

## 2023-08-02 ENCOUNTER — Ambulatory Visit: Payer: Medicare PPO | Admitting: Neurology

## 2023-08-02 ENCOUNTER — Other Ambulatory Visit: Payer: Medicare PPO

## 2023-08-02 ENCOUNTER — Encounter: Payer: Self-pay | Admitting: Neurology

## 2023-08-02 VITALS — BP 106/58 | HR 59 | Ht 70.0 in | Wt 160.4 lb

## 2023-08-02 DIAGNOSIS — G250 Essential tremor: Secondary | ICD-10-CM | POA: Diagnosis not present

## 2023-08-02 DIAGNOSIS — R413 Other amnesia: Secondary | ICD-10-CM | POA: Diagnosis not present

## 2023-08-02 NOTE — Progress Notes (Signed)
NEUROLOGY FOLLOW UP OFFICE NOTE  WASIL TRITLE [id number]  Assessment/Plan:   Memory deficits Essential tremor Right internal carotid artery stenosis Dyslipidemia History of transient ischemic attack    Workup for memory problems: MRI of brain without contrast Check B12, TSH Neuropsychological evaluation For tremor:  propranolol ER 80mg  (prescribed by cardiology) On Plavix and statin for secondary stroke prevention Follow up 6 months    Subjective:  Landin Engquist is a 72 year old ambidextrous Caucasian male with CAD s/p coronary artery bypass grafting, left carotid artery disease s/p CEA, HLD, depression and anxiety, essential tremor and history of traumatic brain injury and TIA who follows up for concerns about memory.  He is accompanied by his wife who supplements history.   UPDATE: Current medications:  Plavix 75mg  daily, atorvastatin 20mg  daily, propranolol ER 80mg  daily  Tremors are stable.  Primary concern is his memory.  He has had memory problems since his TIA.  It has been worse over the past 2 months.  He is misplacing objects such as the keys.  He once left his shoes in a hotel room.  He is having increased word-finding difficulty.  He started making poor decisions.  One time, his wife was in another room working.  He was watching his 48 year old grandson.  He needed to get something from [employer], so he left his grandson at home but didn't tell his wife, who didn't know their grandson was alone.  He reports increased anxiety and irritability.  He is more impatient with his grandson.  He feels like a "zombie".  His father had memory problems, possible dementia.  His brother had paranoid schizophrenia.    HISTORY:  In late April 2021, he had an episode of dizziness, nausea, vomiting.  He was laying on the bed with his right arm laying over the side and couldn't move it for about 5 minutes.  EMS was called and blood pressure was reportedly 160 systolic and EKG  reportedly normal.  Carotid ultrasound on 01/16/2020 showed 60-79% right ICA stenosis and 1-39% left ICA stenosis.  MRI of brain without contrast on 01/18/2020 personally reviewed showed mild chronic small vessel ischemic changes and small remote left frontal infact but no acute intracranial abnormality.  CTA of head and neck from 03/17/2020 personally reviewed showed moderate M1 MCA stenoses bilaterally and 65-70% proximal right ICA stenosis and moderate stenosis at origin of the dominant right vertebral artery.  2D echocardiogram on 03/20/2020 showed EF 60-65% with no cardiac source of embolus.  Carotid ultrasound on 01/28/2021 showed right ICA 60-79% stenosis, no hemodynamically significant stenosis of left ICA.  He is with implantable loop recorder.  No atrial fibrillation has yet been identified.  Carotid ultrasound in January revealed stable 60-79% stenosis in the right ICA, 1-39% stenosis in the left ICA.  2D echocardiogram in February demonstrated LVEF 60-65% with no regional wall motion abnormalities, trivial MVR, and aortic valve sclerosis/calcification but without stenosis and no atrial level shunt.     In 2019, he underwent left CEA for carotid artery stenosis and coronary bypass grafting.  Following procedure, he did have episodes of atrial fibrillation and was on Eliquis for a period of time.    He also has essential tremor.  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   Bilateral carotid artery disease (HCC) 03/07/2018  Left CEA 03/08/18 at time of CABG    CAD (coronary artery disease) 03/03/2018   Cardiac cath  03/03/18 70% distal left main, 99% proximal LAD, 40-50% proximal circumflex, 60% intermediate, 90% ostial right coronary artery, 55% LVEF with some mild distal anterior hypokinesis   Depression  06/30/2016   Dyslipidemia 07/24/2015   The 10-year ASCVD risk score [name], et al., 2013) is: 15.7%   Values used to calculate the score:     Age: 41 years     Sex: Male     Is Non-Hispanic African American: No     Diabetic: No     Tobacco smoker: No     Systolic Blood Pressure: 114 mmHg     Is BP treated: No     HDL Cholesterol: 32 mg/dL     Total Cholesterol: 209 mg/dL The 09-WJXB ASCVD risk score [name], et al., 2013) is   Essential tremor    Generalized anxiety disorder 01/22/2016   History of traumatic head injury    Hyperlipidemia 03/03/2018   Hypogonadism in male 06/20/2018   Hypotension after procedure 06/22/2018   Memory loss 06/30/2016   On amiodarone therapy 06/22/2018   PAF (paroxysmal atrial fibrillation) (HCC) 06/22/2018   Post concussion syndrome 06/30/2016   Post-concussion headache 06/30/2016   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 90 tablet 1   buPROPion (WELLBUTRIN XL) 300 MG 24 hr tablet Take 300 mg by mouth daily.     clopidogrel (PLAVIX) 75 MG tablet Take 1 tablet (75 mg total) by mouth daily. 90 tablet 3   escitalopram (LEXAPRO) 10 MG tablet Take 10 mg by mouth daily.  0   lisdexamfetamine (VYVANSE) 40 MG capsule Take 40 mg by mouth every morning.     NON FORMULARY Apply 50 mg topically daily. Testosterone 50mg /ml  0   propranolol ER (INDERAL LA) 80 MG 24 hr capsule TAKE 1 CAPSULE BY MOUTH EVERY DAY (Patient taking differently: Take 80 mg by mouth daily.) 90 capsule 3   Current Facility-Administered Medications on File Prior to Visit  Medication Dose Route Frequency Provider Last Rate Last Admin   sodium chloride flush (NS) 0.9 % injection 10 mL  10 mL Intravenous PRN Everlena Cooper, Ike Maragh R, DO   20 mL at 03/20/20 1125    ALLERGIES: No Known  Allergies  FAMILY HISTORY: Family History  Problem Relation Age of Onset   Lung cancer Mother    Diabetes Brother       Objective:  Blood pressure (!) 102/48, pulse (!) 51, height 5\' 10"  (1.778 m), weight 162 lb 3.2 oz (73.6 kg), SpO2 97 %. General: No acute distress.  Patient appears well-groomed.   Head:  Normocephalic/atraumatic Eyes:  Fundi examined but not visualized Neck: supple, no paraspinal tenderness, full range of motion Heart:  Regular rate and rhythm Neurological Exam: alert and oriented to person, place, and time.  Speech fluent and not dysarthric, language intact.      08/02/2023   11:00 AM  [employer] Mental Exam  Weekday Correct 1  Current year 1  What state are we in? 1  Amount spent 1  Amount left 2  # of Animals 3  5 objects recall 2  Number series 2  Hour markers 2  Time correct 2  Placed X in triangle correctly 1  Largest Figure 1  Name of male 0  Date back to work 2  Type of work 2  State she lived in 2  Total score 25   CN II-XII intact. Bulk and tone normal, muscle strength 5/5 throughout.  Sensation to light touch intact.  Deep tendon reflexes 2+ throughout.  Finger to nose testing demonstrates bilateral intention tremor in hands.  Gait normal, Romberg negative.    Shon Millet, DO  CC: [name], MD

## 2023-08-02 NOTE — Patient Instructions (Signed)
Check MRI of brain without contrast Check B12, TSH Neurocognitive testing Continue propranolol Follow up 6 months or after neurocognitive testing.

## 2023-08-03 LAB — TSH: TSH: 1.3 m[IU]/L (ref 0.40–4.50)

## 2023-08-03 LAB — VITAMIN B12: Vitamin B-12: 328 pg/mL (ref 200–1100)

## 2023-08-08 DIAGNOSIS — F341 Dysthymic disorder: Secondary | ICD-10-CM | POA: Diagnosis not present

## 2023-08-11 DIAGNOSIS — F341 Dysthymic disorder: Secondary | ICD-10-CM | POA: Diagnosis not present

## 2023-08-11 NOTE — Progress Notes (Signed)
Carelink Summary Report / Loop Recorder 

## 2023-08-12 ENCOUNTER — Ambulatory Visit (INDEPENDENT_AMBULATORY_CARE_PROVIDER_SITE_OTHER): Payer: Medicare PPO

## 2023-08-12 DIAGNOSIS — G459 Transient cerebral ischemic attack, unspecified: Secondary | ICD-10-CM

## 2023-08-12 LAB — CUP PACEART REMOTE DEVICE CHECK
Date Time Interrogation Session: 20241205230115
Implantable Pulse Generator Implant Date: 20210803

## 2023-08-15 ENCOUNTER — Encounter: Payer: Self-pay | Admitting: Psychology

## 2023-08-15 ENCOUNTER — Ambulatory Visit: Payer: Medicare PPO

## 2023-08-15 ENCOUNTER — Ambulatory Visit: Payer: Medicare PPO | Admitting: Psychology

## 2023-08-15 DIAGNOSIS — F0670 Mild neurocognitive disorder due to known physiological condition without behavioral disturbance: Secondary | ICD-10-CM

## 2023-08-15 DIAGNOSIS — G3184 Mild cognitive impairment, so stated: Secondary | ICD-10-CM | POA: Diagnosis not present

## 2023-08-15 DIAGNOSIS — R4189 Other symptoms and signs involving cognitive functions and awareness: Secondary | ICD-10-CM

## 2023-08-15 HISTORY — DX: Mild neurocognitive disorder due to known physiological condition without behavioral disturbance: F06.70

## 2023-08-15 NOTE — Progress Notes (Signed)
   Psychometrician Note   Cognitive testing was administered to Zachary Ponce by Shan Levans, B.S. (psychometrist) under the supervision of Dr. Newman Nickels, Ph.D., licensed psychologist on 08/15/2023. Mr. Stensrud did not appear overtly distressed by the testing session per behavioral observation or responses across self-report questionnaires. Rest breaks were offered.    The battery of tests administered was selected by Dr. Newman Nickels, Ph.D. with consideration to Mr. Hay's current level of functioning, the nature of his symptoms, emotional and behavioral responses during interview, level of literacy, observed level of motivation/effort, and the nature of the referral question. This battery was communicated to the psychometrist. Communication between Dr. Newman Nickels, Ph.D. and the psychometrist was ongoing throughout the evaluation and Dr. Newman Nickels, Ph.D. was immediately accessible at all times. Dr. Newman Nickels, Ph.D. provided supervision to the psychometrist on the date of this service to the extent necessary to assure the quality of all services provided.    BIAGGIO KOSTURA will return within approximately 1-2 weeks for an interactive feedback session with Dr. Milbert Coulter at which time his test performances, clinical impressions, and treatment recommendations will be reviewed in detail. Mr. Kult understands he can contact our office should he require our assistance before this time.  A total of 135 minutes of billable time were spent face-to-face with Mr. Keomany by the psychometrist. This includes both test administration and scoring time. Billing for these services is reflected in the clinical report generated by Dr. Newman Nickels, Ph.D.  This note reflects time spent with the psychometrician and does not include test scores or any clinical interpretations made by Dr. Milbert Coulter. The full report will follow in a separate note.

## 2023-08-15 NOTE — Progress Notes (Signed)
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 tasks, suggesting some limited evidence for information consolidation.   Results of emotional screening instruments suggested that recent symptoms of generalized anxiety were in the severe range, while symptoms of depression were within the mild range. A screening instrument assessing recent sleep quality suggested the presence of minimal sleep dysfunction.  Tables of Scores:   Note: This summary of test scores accompanies the interpretive report and should not be considered in isolation without reference to the appropriate sections in the text. Descriptors are based on appropriate normative data and may be adjusted based on clinical judgment. Terms such as "Within Normal Limits" and "Outside Normal Limits" are used when a more specific description of the test score cannot be determined.       Percentile - Normative Descriptor > 98 - Exceptionally High 91-97 - Well Above Average 75-90 - Above Average 25-74 - Average 9-24 - Below Average 2-8 - Well Below Average < 2 - Exceptionally Low       Validity:   DESCRIPTOR       DCT: --- --- Within Normal Limits  NAB EVI: --- --- Within Normal Limits       Orientation:      Raw Score  Percentile   NAB Orientation, Form 1 29/29 --- ---       Cognitive Screening:      Raw Score Percentile   SLUMS: 29/30 --- ---       Intellectual Functioning:      Standard Score Percentile   Test of Premorbid Functioning: 100 50 Average       Memory:     NAB Memory Module, Form 1: Standard Score/ T Score Percentile   Total Memory Index 67 1 Exceptionally Low  List Learning       Total Trials 1-3 15/36 (35) 7 Well Below Average    List B 3/12 (41) 18 Below Average    Short Delay Free Recall 2/12 (25) 1 Exceptionally Low    Long Delay Free Recall 3/12 (31) 3 Well Below Average    Retention Percentage 150 (67) 96 Well Above Average    Recognition Discriminability -2 (24) <1 Exceptionally Low  Shape Learning       Total Trials 1-3 14/27 (46) 34 Average    Delayed Recall 4/9 (40) 16 Below Average    Retention Percentage 67 (40) 16 Below Average    Recognition Discriminability 7 (52) 58 Average  Story Learning       Immediate Recall 45/80 (35) 7 Well Below Average    Delayed Recall 26/40 (40) 16 Below Average    Retention Percentage 96 (51) 54 Average  Daily Living Memory       Immediate Recall 28/51 (31) 3 Well Below Average    Delayed Recall 5/17 (19) <1 Exceptionally Low  Retention Percentage 36 (18) <1 Exceptionally Low    Recognition Hits 5/10 (20) <1 Exceptionally Low       Attention/Executive Function:     Oral Trail Making Test (OTMT): Raw Score (Z-Score) Percentile     Part A 12 secs.,  0 errors (-2.1) 2 Exceptionally Low    Part B 25 secs.,  0 errors (0.87) 81 Above Average       Symbol Digit Modalities Test (SDMT): Raw Score (Z-Score) Percentile     Oral 39 (-1.02) 15 Below Average       NAB Attention Module, Form 1: T Score Percentile     Digits Forward 44 27 Average    Digits Backwards 40 16 Below Average        Scaled Score Percentile   WAIS-IV Similarities: 9 37 Average       D-KEFS Color-Word Interference Test: Raw Score (Scaled Score) Percentile      Color Naming 34 secs. (10) 50 Average    Word Reading 27 secs. (9) 37 Average    Inhibition 87 secs. (8) 25 Average      Total Errors 8 errors (5) 5 Well Below Average    Inhibition/Switching Discontinued --- Impaired      Total Errors --- --- ---       D-KEFS Verbal Fluency Test: Raw Score (Scaled Score) Percentile     Letter Total Correct 46 (13) 84 Above Average    Category Total Correct 35 (11) 63 Average    Category Switching Total Correct 10 (8) 25 Average    Category Switching Accuracy 7 (6) 9 Below Average      Total Set Loss Errors 5 (6) 9 Below Average      Total Repetition Errors 2 (11) 63 Average       NAB Executive Functions Module, Form 1: T Score Percentile     Judgment 58 79 Above Average       Language:     Verbal Fluency Test: Raw Score (T Score) Percentile     Phonemic Fluency (FAS) 46 (54) 66 Average    Animal Fluency 23 (58) 79 Above Average        NAB Language Module, Form 1: T Score Percentile     Auditory Comprehension 38 12 Below Average    Naming 30/31 (56) 73 Average       Visuospatial/Visuoconstruction:      Raw Score Percentile   Clock Drawing: 10/10 --- Within Normal Limits       NAB Spatial Module, Form 1: T Score Percentile     Figure Drawing Copy 50 50 Average        Scaled Score Percentile   WAIS-IV Visual Puzzles: 9 37 Average       Mood and Personality:      Raw Score Percentile   Beck Depression Inventory - II: 15 --- Mild  PROMIS Anxiety Questionnaire: 28 --- Severe       Additional Questionnaires:      Raw Score Percentile   PROMIS Sleep Disturbance Questionnaire: 24 --- None to Slight   Informed Consent and Coding/Compliance:   The current evaluation represents a clinical evaluation for the purposes previously outlined by the referral source and is in no way reflective of a forensic evaluation.   Mr. Hallmon was provided with a verbal description of the nature and purpose of the present neuropsychological evaluation. Also  reviewed were the foreseeable risks and/or discomforts and benefits of the procedure, limits of confidentiality, and mandatory  reporting requirements of this provider. The patient was given the opportunity to ask questions and receive answers about the evaluation. Oral consent to participate was provided by the patient.   This evaluation was conducted by Newman Nickels, Ph.D., ABPP-CN, board certified clinical neuropsychologist. Mr. Hitt completed a clinical interview with Dr. Milbert Coulter, billed as one unit [phone], and 135 minutes of cognitive testing and scoring, billed as one unit [phone] and four additional units [id number]. Psychometrist Shan Levans, B.S. assisted Dr. Milbert Coulter with test administration and scoring procedures. As a separate and discrete service, one unit [id number] and two units [phone] were billed for Dr. Tammy Sours time spent in interpretation and report writing.

## 2023-08-17 DIAGNOSIS — F341 Dysthymic disorder: Secondary | ICD-10-CM | POA: Diagnosis not present

## 2023-08-18 DIAGNOSIS — F341 Dysthymic disorder: Secondary | ICD-10-CM | POA: Diagnosis not present

## 2023-08-22 ENCOUNTER — Ambulatory Visit: Payer: Medicare PPO | Admitting: Psychology

## 2023-08-22 ENCOUNTER — Ambulatory Visit: Payer: Medicare PPO

## 2023-08-22 DIAGNOSIS — F0670 Mild neurocognitive disorder due to known physiological condition without behavioral disturbance: Secondary | ICD-10-CM | POA: Diagnosis not present

## 2023-08-22 NOTE — Progress Notes (Signed)
   Neuropsychology Feedback Session Zachary Ponce. [facility] Department of Neurology  Reason for Referral:   Zachary Ponce is a 72 y.o. mixed-handed Caucasian male referred by Shon Millet, D.O., to characterize his current cognitive functioning and assist with diagnostic clarity and treatment planning in the context of subjective cognitive decline.   Feedback:   [name] completed a comprehensive neuropsychological evaluation on 08/15/2023. Please refer to that encounter for the full report and recommendations. Briefly, results suggested a primary impairment surrounding verbal learning and memory. Further performance variability was exhibited across processing speed and executive functioning. More specific to memory, overall verbal memory abilities were admittedly much lower than anticipated. While variable, there is some evidence to suggest greater trouble encoding (i.e., learning) novel information as retention rates were 96% and 150% across story and list tasks respectively. However, he did not appear to benefit from cueing and generally performed very poorly across yes/no recognition trials. The etiology for ongoing cognitive dysfunction is likely multifactorial in nature. Neurodevelopmentally, [name] has a history of ADHD and longstanding dysfunction surrounding sustained attention and distractibility. Qualitatively, the psychometrist felt that he was having trouble listening or otherwise focusing on tasks throughout the evaluation, which would align with common behaviors associated with this condition. Neurologically, there is a history of essential tremor, as well as some mild microvascular ischemic disease and a prior small left frontal infarct. Psychiatrically, [name] reported mild depression and severe symptoms of anxiety across current mood-related questionnaires. When combined together, they create the possibility of [name] difficulties being caused by a  combination of medical/neurological factors and significant psychiatric distress, superimposed on his longstanding history of ADHD. This could certainly impact encoding/retrieval aspects of verbal memory, as well as weaknesses surrounding processing speed and executive functioning. This combination remains plausible and should remain on his differential moving forward.   [name] was accompanied by his wife during the current feedback session. Content of the current session focused on the results of his neuropsychological evaluation. [name] was given the opportunity to ask questions and his questions were answered. He was encouraged to reach out should additional questions arise. A copy of his report was provided at the conclusion of the visit.      One unit [phone] was billed for Dr. Tammy Sours time spent preparing for, conducting, and documenting the current feedback session with [name].

## 2023-08-24 DIAGNOSIS — F341 Dysthymic disorder: Secondary | ICD-10-CM | POA: Diagnosis not present

## 2023-08-25 DIAGNOSIS — F341 Dysthymic disorder: Secondary | ICD-10-CM | POA: Diagnosis not present

## 2023-08-26 ENCOUNTER — Encounter: Payer: Self-pay | Admitting: Neurology

## 2023-08-29 DIAGNOSIS — F341 Dysthymic disorder: Secondary | ICD-10-CM | POA: Diagnosis not present

## 2023-09-02 ENCOUNTER — Ambulatory Visit
Admission: RE | Admit: 2023-09-02 | Discharge: 2023-09-02 | Disposition: A | Discharge status: Home / Self Care | Payer: Medicare PPO | Source: Ambulatory Visit | Attending: Neurology

## 2023-09-02 DIAGNOSIS — I6782 Cerebral ischemia: Secondary | ICD-10-CM | POA: Diagnosis not present

## 2023-09-02 DIAGNOSIS — G319 Degenerative disease of nervous system, unspecified: Secondary | ICD-10-CM | POA: Diagnosis not present

## 2023-09-02 DIAGNOSIS — R413 Other amnesia: Secondary | ICD-10-CM | POA: Diagnosis not present

## 2023-09-06 NOTE — Progress Notes (Signed)
Patient advised.

## 2023-09-12 DIAGNOSIS — F341 Dysthymic disorder: Secondary | ICD-10-CM | POA: Diagnosis not present

## 2023-09-15 DIAGNOSIS — F341 Dysthymic disorder: Secondary | ICD-10-CM | POA: Diagnosis not present

## 2023-09-16 ENCOUNTER — Ambulatory Visit: Payer: Medicare PPO

## 2023-09-16 DIAGNOSIS — G459 Transient cerebral ischemic attack, unspecified: Secondary | ICD-10-CM

## 2023-09-16 LAB — CUP PACEART REMOTE DEVICE CHECK
Date Time Interrogation Session: 20250109230435
Implantable Pulse Generator Implant Date: 20210803

## 2023-09-19 ENCOUNTER — Encounter: Payer: Self-pay | Admitting: Physician Assistant

## 2023-09-19 ENCOUNTER — Ambulatory Visit: Payer: Medicare PPO | Admitting: Physician Assistant

## 2023-09-19 ENCOUNTER — Ambulatory Visit (HOSPITAL_COMMUNITY)
Admission: RE | Admit: 2023-09-19 | Discharge: 2023-09-19 | Disposition: A | Discharge status: Home / Self Care | Payer: Medicare PPO | Source: Ambulatory Visit | Attending: Vascular Surgery | Admitting: Vascular Surgery

## 2023-09-19 VITALS — BP 133/60 | HR 56 | Temp 98.3°F | Resp 18 | Ht 70.0 in | Wt 162.1 lb

## 2023-09-19 DIAGNOSIS — I6523 Occlusion and stenosis of bilateral carotid arteries: Secondary | ICD-10-CM

## 2023-09-19 DIAGNOSIS — F341 Dysthymic disorder: Secondary | ICD-10-CM | POA: Diagnosis not present

## 2023-09-19 NOTE — Progress Notes (Signed)
 Office Note     CC:  follow up Requesting Provider:  Arloa Elsie SAUNDERS, MD  HPI: Zachary Ponce is a 73 y.o. (05-14-1951) male who presents for surveillance follow up of carotid artery stenosis. He has history of combined CABG and Left CEA with Dr. Elois and Dr. Harvey in July of 2019. We have been following his right ICA stenosis of about 50%. He has been without any TIA or stroke like symptoms  Today he reports overall he has been doing okay. He has been having some memory deficits, word finding difficulty, and dizziness. He has been seeing Neurologist, Dr. Skeet. He reports though that all his testing has come back essentially normal. He was told his memory and decision making changes are all age related. He says his wife tells him he slurs his words/ or mumbles. He doesn't notice it. His dizziness he says occurs when lying back for any reason. 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 He is not followed by ENT.  He otherwise says he has had some vision changes with blurring and double vision mostly in right eye but he has not been back in almost a year to get his eyes checked. He wears glasses regularly. He otherwise denies any loss of vision or amaurosis. He denies any facial drooping, unilateral upper or lower extremity weakness or numbness. He feels he needs to walk more. He is medically managed on statin and Plavix .   Past Medical History:  Diagnosis Date   ADHD (attention deficit hyperactivity disorder)    dx as an adult   Angina pectoris    Atherosclerosis of both carotid arteries 09/01/2020   Bilateral carotid artery disease 03/07/2018   Left CEA 03/08/18 at time of CABG      CAD (coronary artery disease) 03/03/2018   Cardiac cath  03/03/18 70% distal left main, 99% proximal LAD, 40-50% proximal circumflex, 60% intermediate, 90% ostial right coronary artery, 55% LVEF with some mild distal anterior hypokinesis   Dyslipidemia 07/24/2015   The 10-year ASCVD risk  score Verdon DC Jr., et al., 2013) is: 15.7%   Values used to calculate the score:     Age: 77 years     Sex: Male     Is Non-Hispanic African American: No     Diabetic: No     Tobacco smoker: No     Systolic Blood Pressure: 114 mmHg     Is BP treated: No     HDL Cholesterol: 32 mg/dL     Total Cholesterol: 209 mg/dL The 89-bzjm ASCVD risk score Verdon DC Jr., et al., 2013) is   Dysthymia 06/30/2016   Essential tremor    Generalized anxiety disorder 01/22/2016   History of colonic polyps 09/01/2020   History of traumatic head injury 2012   02/18/11, loss of consciousness     Formatting of this note might be different from the original.  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 LIMA TO LAD, SVG TO OM, SVG TO RAMUS, SVG TO PD.;  Surgeon: Fleeta Hanford Coy, MD;  Location: [facility] OR;  Service: Open Heart Surgery;  Laterality: N/A;   ENDARTERECTOMY Left 03/08/2018   Procedure: ENDARTERECTOMY CAROTID;  Surgeon: Harvey Carlin BRAVO, MD;  Location: [facility] OR;  Service: Vascular;  Laterality: Left;   KNEE SURGERY     LEFT HEART CATH AND CORONARY ANGIOGRAPHY N/A 03/03/2018   Procedure: LEFT HEART CATH AND CORONARY ANGIOGRAPHY;  Surgeon: Burnard Debby LABOR, MD;  Location: [facility] INVASIVE CV LAB;  Service: Cardiovascular;  Laterality: N/A;   TEE WITHOUT CARDIOVERSION N/A 03/08/2018   Procedure:  TRANSESOPHAGEAL ECHOCARDIOGRAM (TEE);  Surgeon: Fleeta Hanford, Coy, MD;  Location: [facility] OR;  Service: Open Heart Surgery;  Laterality: N/A;   TONSILLECTOMY      Social History   Socioeconomic History   Marital status: Married    Spouse name: Not on file   Number of children: Not on file   Years of education: 73   Highest education level: Master's degree (e.g., MA, MS, MEng, MEd, MSW, MBA)  Occupational History   Occupation: Retired  Tobacco Use   Smoking status: Never    Passive exposure: Never   Smokeless tobacco: Never  Vaping Use   Vaping status: Never Used  Substance and Sexual Activity   Alcohol use: Not Currently    Comment: very infrequent   Drug use: Never   Sexual activity: Yes  Other Topics Concern   Not on file  Social History Narrative   Left handed   3 story home   Drink decaffaine   Social Drivers of Health   Financial Resource Strain: Not on file  Food Insecurity: No Food Insecurity (07/20/2021)   Hunger Vital Sign    Worried About Running Out of Food in the Last Year: Never true    Ran Out of Food in the Last Year: Never true  Transportation Needs: Not on file  Physical Activity: Not on file  Stress: Not on file  Social Connections: Not on file  Intimate Partner Violence: Not on file    Family History  Problem Relation Age of Onset   Lung cancer Mother    Tremor Mother    Other Father        TBI   Diabetes Brother    Schizophrenia Brother    Tremor Maternal Grandmother    Dementia Paternal Grandmother     Current Outpatient Medications  Medication Sig Dispense Refill   atorvastatin  (LIPITOR ) 20 MG tablet Take 1 tablet (20 mg total) by mouth daily. 90 tablet 1   buPROPion  (WELLBUTRIN  XL) 300 MG 24 hr tablet Take 300 mg by mouth daily.     clopidogrel  (PLAVIX ) 75 MG tablet Take 1 tablet (75 mg total) by mouth daily. 90 tablet 3   escitalopram  (LEXAPRO ) 10 MG tablet Take 10 mg by mouth daily.  0   lisdexamfetamine (VYVANSE ) 40 MG capsule Take  40 mg by mouth every morning.     NON FORMULARY Apply 50 mg topically daily. Testosterone  50mg /ml  0   propranolol  ER (INDERAL  LA) 80 MG 24 hr capsule TAKE 1 CAPSULE BY MOUTH EVERY DAY (Patient taking differently: Take 80 mg by mouth daily.) 90 capsule 3   No current facility-administered medications for this visit.   Facility-Administered Medications Ordered in Other Visits  Medication Dose Route Frequency Provider Last Rate Last Admin  sodium chloride  flush (NS) 0.9 % injection 10 mL  10 mL Intravenous PRN Jaffe, Adam R, DO   20 mL at 03/20/20 1125    No Known Allergies   REVIEW OF SYSTEMS:  [X]  denotes positive finding, [ ]  denotes negative finding Cardiac  Comments:  Chest pain or chest pressure:    Shortness of breath upon exertion:    Short of breath when lying flat:    Irregular heart rhythm:        Vascular    Pain in calf, thigh, or hip brought on by ambulation:    Pain in feet at night that wakes you up from your sleep:     Blood clot in your veins:    Leg swelling:         Pulmonary    Oxygen at home:    Productive cough:     Wheezing:         Neurologic    Sudden weakness in arms or legs:     Sudden numbness in arms or legs:     Sudden onset of difficulty speaking or slurred speech:    Temporary loss of vision in one eye:     Problems with dizziness:         Gastrointestinal    Blood in stool:     Vomited blood:         Genitourinary    Burning when urinating:     Blood in urine:        Psychiatric    Major depression:         Hematologic    Bleeding problems:    Problems with blood clotting too easily:        Skin    Rashes or ulcers:        Constitutional    Fever or chills:      PHYSICAL EXAMINATION:  Vitals:   09/19/23 1213 09/19/23 1214  BP: 131/71 133/60  Pulse:  (!) 56  Resp:  18  Temp:  98.3 F (36.8 C)  TempSrc:  Temporal  SpO2:  97%  Weight:  162 lb 1.6 oz (73.5 kg)  Height:  5' 10 (1.778 m)    General:  WDWN in NAD;  vital signs documented above Gait: Normal HENT: WNL, normocephalic Pulmonary: normal non-labored breathing without wheezing Cardiac: regular HR Vascular Exam/Pulses: 2+ radial, 2+ femoral, 2+ DP and PT pulses bilaterally Extremities: without ischemic changes, without Gangrene , without cellulitis; without open wounds;  Musculoskeletal: no muscle wasting or atrophy  Neurologic: A&O X 3 Psychiatric:  The pt has Normal affect.   Non-Invasive Vascular Imaging:   VAS US  Carotid Duplex: Summary:  Right Carotid: Velocities in the right ICA are consistent with a 40-59% stenosis.   Left Carotid: Velocities in the left ICA are consistent with a 1-39% stenosis.   Vertebrals: Bilateral vertebral arteries demonstrate antegrade flow.  Subclavians: Bilateral subclavian arteries were stenotic.    ASSESSMENT/PLAN:: 73 y.o. male here for follow up for carotid artery stenosis. He has history of combined CABG and Left CEA with Dr. Elois and Dr. Harvey in July of 2019. We have been following his right ICA stenosis of about 50%. He has been without any TIA or stroke like symptoms. He is seeing neurology for a mix of symptoms. I do not think any of his symptoms are related to his carotid disease. Recommend he continue to follow up with Neurology for further evaluation.  - Duplex today is overall stable. Right  ICA with 40-59% stenosis, left ICA 1-39%. Normal flow in the vertebral arteries and elevated velocities in the subclavian arteries bilaterally.  -Continue Statin and Plavix  - Reviewed signs and symptoms of stroke and he understands should this occur to seek immediate medical attention - He will follow up in 1 year with repeat carotid duplex   Teretha Damme, PA-C [facility] [phone]  Clinic MD:   [place]

## 2023-09-22 DIAGNOSIS — F341 Dysthymic disorder: Secondary | ICD-10-CM | POA: Diagnosis not present

## 2023-09-23 ENCOUNTER — Ambulatory Visit: Payer: Medicare PPO | Attending: Cardiology | Admitting: Cardiology

## 2023-09-23 ENCOUNTER — Encounter: Payer: Self-pay | Admitting: Cardiology

## 2023-09-23 VITALS — BP 124/68 | HR 60 | Ht 70.0 in | Wt 160.0 lb

## 2023-09-23 DIAGNOSIS — I480 Paroxysmal atrial fibrillation: Secondary | ICD-10-CM | POA: Diagnosis not present

## 2023-09-23 DIAGNOSIS — I2510 Atherosclerotic heart disease of native coronary artery without angina pectoris: Secondary | ICD-10-CM | POA: Diagnosis not present

## 2023-09-23 DIAGNOSIS — I6523 Occlusion and stenosis of bilateral carotid arteries: Secondary | ICD-10-CM

## 2023-09-23 NOTE — Patient Instructions (Signed)
 Medication Instructions:  Your physician recommends that you continue on your current medications as directed. Please refer to the Current Medication list given to you today.  *If you need a refill on your cardiac medications before your next appointment, please call your pharmacy*   Lab Work: None Ordered If you have labs (blood work) drawn today and your tests are completely normal, you will receive your results only by: MyChart Message (if you have MyChart) OR A paper copy in the mail If you have any lab test that is abnormal or we need to change your treatment, we will call you to review the results.   Testing/Procedures: None Ordered   Follow-Up: At [facility], you and your health needs are our priority.  As part of our continuing mission to provide you with exceptional heart care, we have created designated Provider Care Teams.  These Care Teams include your primary Cardiologist (physician) and Advanced Practice Providers (APPs -  Physician Assistants and Nurse Practitioners) who all work together to provide you with the care you need, when you need it.  We recommend signing up for the patient portal called "MyChart".  Sign up information is provided on this After Visit Summary.  MyChart is used to connect with patients for Virtual Visits (Telemedicine).  Patients are able to view lab/test results, encounter notes, upcoming appointments, etc.  Non-urgent messages can be sent to your provider as well.   To learn more about what you can do with MyChart, go to [email or web address].    Your next appointment:   6 month follow up

## 2023-09-23 NOTE — Progress Notes (Unsigned)
Cardiology Office Note:    Date:  09/23/2023   ID:  Zachary Ponce, DOB [birth year], MRN [id number]  PCP:  Noberto Retort, MD  Cardiologist:  [name], MD    Referring MD: Noberto Retort, MD   No chief complaint on file.   History of Present Illness:    Zachary Ponce is a 73 y.o. male  past medical history significant for coronary artery disease status post coronary bypass grafting 2019, carotic artery stenosis up to 79% followed by vascular surgeon from [place], intermittent 5 he does have appoint with them tomorrow. He does have implantable loop recorder, no arrhythmia detected, essential hypertension, dyslipidemia, bradycardia.  Comes today to months for follow-up overall doing fine.  Denies any chest pain tightness squeezing pressure burning chest.  He did have Mini-Mental status examination actually look like he had full mental status examination there are some question about dementia they said only minimal deterioration but overall doing fine trying to be active but admits because of however not doing much but planning to be with him aggressive   Past Medical History:  Diagnosis Date   ADHD (attention deficit hyperactivity disorder)    dx as an adult   Angina pectoris    Atherosclerosis of both carotid arteries 09/01/2020   Bilateral carotid artery disease 03/07/2018   Left CEA 03/08/18 at time of CABG      CAD (coronary artery disease) 03/03/2018   Cardiac cath  03/03/18 70% distal left main, 99% proximal LAD, 40-50% proximal circumflex, 60% intermediate, 90% ostial right coronary artery, 55% LVEF with some mild distal anterior hypokinesis   Dyslipidemia 07/24/2015   The 10-year ASCVD risk score [name], et al., 2013) is: 15.7%   Values used to calculate the score:     Age: 89 years     Sex: Male     Is Non-Hispanic African American: No     Diabetic: No     Tobacco smoker: No     Systolic Blood Pressure: 114 mmHg     Is BP treated: No     HDL Cholesterol:  32 mg/dL     Total Cholesterol: 209 mg/dL The 62-ZHYQ ASCVD risk score [name], et al., 2013) is   Dysthymia 06/30/2016   Essential tremor    Generalized anxiety disorder 01/22/2016   History of colonic polyps 09/01/2020   History of traumatic head injury 2012   02/18/11, loss of consciousness     Formatting of this note might be different from the original.  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 LIMA TO LAD, SVG TO OM, SVG TO RAMUS, SVG TO PD.;  Surgeon: [name], MD;  Location: [facility] OR;  Service: Open Heart Surgery;  Laterality: N/A;   ENDARTERECTOMY Left 03/08/2018   Procedure: ENDARTERECTOMY CAROTID;  Surgeon: [name], MD;  Location: [facility] OR;  Service:  Vascular;  Laterality: Left;   KNEE SURGERY     LEFT HEART CATH AND CORONARY ANGIOGRAPHY N/A 03/03/2018   Procedure: LEFT HEART CATH AND CORONARY ANGIOGRAPHY;  Surgeon: [name], MD;  Location: [facility] INVASIVE CV LAB;  Service: Cardiovascular;  Laterality: N/A;   TEE WITHOUT CARDIOVERSION N/A 03/08/2018   Procedure: TRANSESOPHAGEAL ECHOCARDIOGRAM (TEE);  Surgeon: [name], [name], MD;  Location: [facility] OR;  Service: Open Heart Surgery;  Laterality: N/A;   TONSILLECTOMY      Current Medications: Current Meds  Medication Sig   atorvastatin (LIPITOR) 20 MG tablet Take 1 tablet (20 mg total) by  mouth daily.   buPROPion (WELLBUTRIN XL) 300 MG 24 hr tablet Take 300 mg by mouth daily.   clopidogrel (PLAVIX) 75 MG tablet Take 1 tablet (75 mg total) by mouth daily.   escitalopram (LEXAPRO) 10 MG tablet Take 10 mg by mouth daily.   lisdexamfetamine (VYVANSE) 40 MG capsule Take 40 mg by mouth every morning.   NON FORMULARY Apply 50 mg topically daily. Testosterone 50mg /ml   propranolol ER (INDERAL LA) 80 MG 24 hr capsule TAKE 1 CAPSULE BY MOUTH EVERY DAY (Patient taking differently: Take 80 mg by mouth daily.)     Allergies:   Patient has no known allergies.   Social History   Socioeconomic History   Marital status: Married    Spouse name: Not on file   Number of children: Not on file   Years of education: 52   Highest education level: Master's degree (e.g., MA, MS, MEng, MEd, MSW, MBA)  Occupational History   Occupation: Retired  Tobacco Use   Smoking status: Never    Passive exposure: Never   Smokeless tobacco: Never  Vaping Use   Vaping status: Never Used  Substance and Sexual Activity   Alcohol use: Not Currently    Comment: very infrequent   Drug use: Never   Sexual activity: Yes  Other Topics Concern   Not on file  Social History Narrative   Left handed   3 story home   Drink decaffaine   Social Drivers of Health   Financial Resource Strain: Not on file  Food Insecurity: No Food Insecurity (07/20/2021)   Hunger Vital Sign    Worried About Running Out of Food in the Last Year: Never true    Ran Out of Food in the Last Year: Never true  Transportation Needs: Not on file  Physical Activity: Not on file  Stress: Not on file  Social Connections: Not on file     Family History: The patient's family history includes Dementia in his paternal grandmother; Diabetes in his brother; Lung cancer in his mother; Other in his father; Schizophrenia in his brother; Tremor in his maternal grandmother and mother. ROS:   Please see the history of present illness.    All 14  point review of systems negative except as described per history of present illness  EKGs/Labs/Other Studies Reviewed:    EKG Interpretation Date/Time:  Friday September 23 2023 15:59:51 EST Ventricular Rate:  60 PR Interval:  202 QRS Duration:  88 QT Interval:  428 QTC Calculation: 428 R Axis:   -11  Text Interpretation: Normal sinus rhythm Minimal voltage criteria for LVH, may be normal variant ( Sokolow-Lyon ) Nonspecific T wave abnormality When compared with ECG of 13-Mar-2018 12:24, Sinus rhythm has replaced Atrial fibrillation Vent. rate has decreased BY  66 BPM Confirmed by [name] [phone]) on 09/23/2023 4:18:21 PM    Recent Labs:  08/02/2023: TSH 1.30  Recent Lipid Panel    Component Value Date/Time   CHOL 112 04/07/2022 1531   TRIG 120 04/07/2022 1531   HDL 39 (L) 04/07/2022 1531   CHOLHDL 2.9 04/07/2022 1531   LDLCALC 51 04/07/2022 1531    Physical Exam:    VS:  BP 124/68 (BP Location: Right Arm)   Pulse 60   Ht 5\' 10"  (1.778 m)   Wt 160 lb (72.6 kg)   SpO2 97%   BMI 22.96 kg/m     Wt Readings from Last 3 Encounters:  09/23/23 160 lb (72.6 kg)  09/19/23 162 lb 1.6 oz (73.5 kg)  08/02/23 160 lb 6.4 oz (72.8 kg)     GEN:  Well nourished, well developed in no acute distress HEENT: Normal NECK: No JVD; No carotid bruits LYMPHATICS: No lymphadenopathy CARDIAC: RRR, no murmurs, no rubs, no gallops RESPIRATORY:  Clear to auscultation without rales, wheezing or rhonchi  ABDOMEN: Soft, non-tender, non-distended MUSCULOSKELETAL:  No edema; No deformity  SKIN: Warm and dry LOWER EXTREMITIES: no swelling NEUROLOGIC:  Alert and oriented x 3 PSYCHIATRIC:  Normal affect   ASSESSMENT:    1. PAF (paroxysmal atrial fibrillation) (HCC)   2. Atherosclerosis of both carotid arteries   3. Coronary artery disease involving native coronary artery of native heart without angina pectoris    PLAN:    In order of problems listed above:  Coronary disease stable  from that point review, continue monitoring. Dyslipidemia I did review K PN LDL 51 HDL 37 good cholesterol control continue present management. Bilateral carotic artery stenosis recent carotid ultrasound reviewed no worsening continue monitoring Paroxysmal atrial fibrillation no recurrences implantable loop recorder in place   Medication Adjustments/Labs and Tests Ordered: Current medicines are reviewed at length with the patient today.  Concerns regarding medicines are outlined above.  Orders Placed This Encounter  Procedures   EKG 12-Lead   Medication changes: No orders of the defined types were placed in this encounter.   Signed, Georgeanna Lea, MD, [facility] 09/23/2023 4:25 PM    [facility]

## 2023-09-26 DIAGNOSIS — F341 Dysthymic disorder: Secondary | ICD-10-CM | POA: Diagnosis not present

## 2023-09-27 ENCOUNTER — Other Ambulatory Visit: Payer: Self-pay

## 2023-09-27 DIAGNOSIS — I6523 Occlusion and stenosis of bilateral carotid arteries: Secondary | ICD-10-CM

## 2023-09-29 DIAGNOSIS — F341 Dysthymic disorder: Secondary | ICD-10-CM | POA: Diagnosis not present

## 2023-10-03 DIAGNOSIS — F341 Dysthymic disorder: Secondary | ICD-10-CM | POA: Diagnosis not present

## 2023-10-06 DIAGNOSIS — F341 Dysthymic disorder: Secondary | ICD-10-CM | POA: Diagnosis not present

## 2023-10-10 DIAGNOSIS — F341 Dysthymic disorder: Secondary | ICD-10-CM | POA: Diagnosis not present

## 2023-10-10 NOTE — Progress Notes (Signed)
 10/13/2023 11:54 AM   Zachary Ponce [birth year] [id number]  Referring provider: Arloa Elsie SAUNDERS, MD [phone] [address] [place],  [place] [address]  No chief complaint on file.   HPI: Zachary Ponce is here today for his first visit in [place].  He has been treated for testosterone  deficiency with testosterone  cream since 2002.  Because of not having been seen for a while, he has been off the cream for 4 months now.  He uses 5% cream, 1 Milliliter applied daily.  He has not had recent labs.  He denies any significant lower urinary tract symptoms.   PMH: Past Medical History:  Diagnosis Date   ADHD (attention deficit hyperactivity disorder)    dx as an adult   Angina pectoris    Atherosclerosis of both carotid arteries 09/01/2020   Bilateral carotid artery disease 03/07/2018   Left CEA 03/08/18 at time of CABG      CAD (coronary artery disease) 03/03/2018   Cardiac cath  03/03/18 70% distal left main, 99% proximal LAD, 40-50% proximal circumflex, 60% intermediate, 90% ostial right coronary artery, 55% LVEF with some mild distal anterior hypokinesis   Dyslipidemia 07/24/2015   The 10-year ASCVD risk score Verdon DC Jr., et al., 2013) is: 15.7%   Values used to calculate the score:     Age: 73 years     Sex: Male     Is Non-Hispanic African American: No     Diabetic: No     Tobacco smoker: No     Systolic Blood Pressure: 114 mmHg     Is BP treated: No     HDL Cholesterol: 32 mg/dL     Total Cholesterol: 209 mg/dL The 89-bzjm ASCVD risk score Verdon DC Jr., et al., 2013) is   Dysthymia 06/30/2016   Essential tremor    Generalized anxiety disorder 01/22/2016   History of colonic polyps 09/01/2020   History of traumatic head injury 2012   02/18/11, loss of consciousness     Formatting of this note might be different from the original.  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 LIMA TO LAD, SVG TO OM, SVG TO RAMUS, SVG TO PD.;  Surgeon: Fleeta Hanford Coy, MD;  Location: [facility] OR;  Service: Open Heart Surgery;  Laterality: N/A;   ENDARTERECTOMY Left 03/08/2018   Procedure: ENDARTERECTOMY CAROTID;  Surgeon: Harvey Carlin BRAVO, MD;  Location: [facility] OR;  Service: Vascular;  Laterality: Left;   KNEE SURGERY     LEFT HEART CATH AND CORONARY ANGIOGRAPHY N/A 03/03/2018   Procedure: LEFT HEART CATH AND CORONARY ANGIOGRAPHY;  Surgeon: Burnard Debby LABOR, MD;  Location: [facility] INVASIVE CV LAB;  Service: Cardiovascular;  Laterality: N/A;   TEE WITHOUT CARDIOVERSION N/A 03/08/2018   Procedure: TRANSESOPHAGEAL ECHOCARDIOGRAM (TEE);  Surgeon: Fleeta Hanford, Coy, MD;  [facility];  Service: Open Heart Surgery;  Laterality:  N/A;   TONSILLECTOMY      Home Medications:  Allergies as of 10/13/2023   No Known Allergies      Medication List        Accurate as of October 10, 2023 11:54 AM. If you have any questions, ask your nurse or doctor.          atorvastatin  20 MG tablet Commonly known as: LIPITOR  Take 1 tablet (20 mg total) by mouth daily.   buPROPion  300 MG 24 hr tablet Commonly known as: WELLBUTRIN  XL Take 300 mg by mouth daily.   clopidogrel  75 MG tablet Commonly known as: PLAVIX  Take 1 tablet (75 mg total) by mouth daily.   escitalopram  10 MG tablet Commonly known as: LEXAPRO  Take 10 mg by mouth daily.   lisdexamfetamine 40 MG  capsule Commonly known as: VYVANSE  Take 40 mg by mouth every morning.   NON FORMULARY Apply 50 mg topically daily. Testosterone  50mg /ml   propranolol  ER 80 MG 24 hr capsule Commonly known as: INDERAL  LA TAKE 1 CAPSULE BY MOUTH EVERY DAY What changed: how much to take        Allergies: No Known Allergies  Family History: Family History  Problem Relation Age of Onset   Lung cancer Mother    Tremor Mother    Other Father        TBI   Diabetes Brother    Schizophrenia Brother    Tremor Maternal Grandmother    Dementia Paternal Grandmother     Social History:  reports that he has never smoked. He has never been exposed to tobacco smoke. He has never used smokeless tobacco. He reports that he does not currently use alcohol. He reports that he does not use drugs.  ROS: All other review of systems were reviewed and are negative except what is noted above in HPI  Physical Exam: There were no vitals taken for this visit.  Constitutional:  Alert and oriented, No acute distress. HEENT: [place] AT, moist mucus membranes.  Trachea midline, no masses. Cardiovascular: No clubbing, cyanosis, or edema. Respiratory: Normal respiratory effort, no increased work of breathing. GI: No inguinal hernias GU: No CVA tenderness. Circumcised phallus. No masses/lesions on penis, testis, scrotum. Prostate 30g smooth no nodules no induration.  Lymph: No cervical or inguinal lymphadenopathy. Skin: No rashes, bruises or suspicious lesions. Neurologic: Grossly intact, no focal deficits, moving all 4 extremities. Psychiatric: Normal mood and affect.  Laboratory Data: Lab Results  Component Value Date   WBC 10.3 03/14/2018   HGB 10.3 (L) 03/14/2018   HCT 31.7 (L) 03/14/2018   MCV 97.5 03/14/2018   PLT 281 03/14/2018    Lab Results  Component Value Date   CREATININE 1.01 08/21/2019    No results found for: PSA  No results found for: TESTOSTERONE   Lab Results  Component Value Date    HGBA1C 5.5 03/08/2018    Urinalysis    Component Value Date/Time   COLORURINE YELLOW 03/04/2018 2211   APPEARANCEUR CLEAR 03/04/2018 2211   LABSPEC 1.014 03/04/2018 2211   PHURINE 6.0 03/04/2018 2211   GLUCOSEU NEGATIVE 03/04/2018 2211   HGBUR NEGATIVE 03/04/2018 2211   BILIRUBINUR NEGATIVE 03/04/2018 2211   KETONESUR NEGATIVE 03/04/2018 2211   PROTEINUR NEGATIVE 03/04/2018 2211   UROBILINOGEN 0.2 02/18/2011 1711   NITRITE NEGATIVE 03/04/2018 2211   LEUKOCYTESUR NEGATIVE 03/04/2018 2211    No results found for: LABMICR, WBCUA, RBCUA, LABEPIT, MUCUS, BACTERIA  I have [facility]  Prior laboratories  reviewed.  These include hemoglobin/hematocrit, PSA and testosterone  levels from September, 2023.   Assessment & Plan:   Testosterone  deficiency, on repletion.  He is currently off of this medication There are no diagnoses linked to this encounter. I prescribed testosterone  cream 5%, 1 mL applied to skin daily  Appropriate labs drawn today  I will have him come back in a year for recheck No follow-ups on file.  Garnette CHRISTELLA Shack, MD  [facility] [place]

## 2023-10-12 DIAGNOSIS — F341 Dysthymic disorder: Secondary | ICD-10-CM | POA: Diagnosis not present

## 2023-10-13 ENCOUNTER — Ambulatory Visit: Payer: Medicare PPO | Admitting: Urology

## 2023-10-13 VITALS — BP 115/59 | HR 54

## 2023-10-13 DIAGNOSIS — R7989 Other specified abnormal findings of blood chemistry: Secondary | ICD-10-CM | POA: Diagnosis not present

## 2023-10-13 LAB — URINALYSIS, ROUTINE W REFLEX MICROSCOPIC
Bilirubin, UA: NEGATIVE
Glucose, UA: NEGATIVE
Ketones, UA: NEGATIVE
Leukocytes,UA: NEGATIVE
Nitrite, UA: NEGATIVE
Protein,UA: NEGATIVE
RBC, UA: NEGATIVE
Specific Gravity, UA: 1.025 (ref 1.005–1.030)
Urobilinogen, Ur: 0.2 mg/dL (ref 0.2–1.0)
pH, UA: 6 (ref 5.0–7.5)

## 2023-10-14 LAB — PSA: Prostate Specific Ag, Serum: 0.2 ng/mL (ref 0.0–4.0)

## 2023-10-14 LAB — CBC
Hematocrit: 41.9 % (ref 37.5–51.0)
Hemoglobin: 13.7 g/dL (ref 13.0–17.7)
MCH: 31.7 pg (ref 26.6–33.0)
MCHC: 32.7 g/dL (ref 31.5–35.7)
MCV: 97 fL (ref 79–97)
Platelets: 178 10*3/uL (ref 150–450)
RBC: 4.32 x10E6/uL (ref 4.14–5.80)
RDW: 11.9 % (ref 11.6–15.4)
WBC: 3.5 10*3/uL (ref 3.4–10.8)

## 2023-10-14 LAB — TESTOSTERONE: Testosterone: 435 ng/dL (ref 264–916)

## 2023-10-17 DIAGNOSIS — F341 Dysthymic disorder: Secondary | ICD-10-CM | POA: Diagnosis not present

## 2023-10-18 ENCOUNTER — Ambulatory Visit: Payer: Medicare PPO | Admitting: Urology

## 2023-10-20 DIAGNOSIS — F341 Dysthymic disorder: Secondary | ICD-10-CM | POA: Diagnosis not present

## 2023-10-21 ENCOUNTER — Ambulatory Visit (INDEPENDENT_AMBULATORY_CARE_PROVIDER_SITE_OTHER): Payer: Medicare PPO

## 2023-10-21 DIAGNOSIS — G459 Transient cerebral ischemic attack, unspecified: Secondary | ICD-10-CM

## 2023-10-21 NOTE — Progress Notes (Signed)
Carelink Summary Report / Loop Recorder

## 2023-10-22 LAB — CUP PACEART REMOTE DEVICE CHECK
Date Time Interrogation Session: 20250213230022
Implantable Pulse Generator Implant Date: 20210803

## 2023-10-24 DIAGNOSIS — F341 Dysthymic disorder: Secondary | ICD-10-CM | POA: Diagnosis not present

## 2023-10-27 DIAGNOSIS — F341 Dysthymic disorder: Secondary | ICD-10-CM | POA: Diagnosis not present

## 2023-10-31 DIAGNOSIS — F341 Dysthymic disorder: Secondary | ICD-10-CM | POA: Diagnosis not present

## 2023-11-03 DIAGNOSIS — F341 Dysthymic disorder: Secondary | ICD-10-CM | POA: Diagnosis not present

## 2023-11-07 DIAGNOSIS — F341 Dysthymic disorder: Secondary | ICD-10-CM | POA: Diagnosis not present

## 2023-11-10 DIAGNOSIS — F341 Dysthymic disorder: Secondary | ICD-10-CM | POA: Diagnosis not present

## 2023-11-21 DIAGNOSIS — F341 Dysthymic disorder: Secondary | ICD-10-CM | POA: Diagnosis not present

## 2023-11-22 NOTE — Addendum Note (Signed)
 Addended by: Elease Etienne A on: 11/22/2023 09:35 AM   Modules accepted: Orders

## 2023-11-22 NOTE — Progress Notes (Signed)
 Carelink Summary Report / Loop Recorder

## 2023-11-24 ENCOUNTER — Other Ambulatory Visit: Payer: Self-pay

## 2023-11-24 DIAGNOSIS — F341 Dysthymic disorder: Secondary | ICD-10-CM | POA: Diagnosis not present

## 2023-11-24 MED ORDER — ATORVASTATIN CALCIUM 20 MG PO TABS: 20.0000 mg | ORAL_TABLET | Freq: Every day | ORAL | 2 refills | Status: DC

## 2023-11-25 ENCOUNTER — Ambulatory Visit: Payer: Medicare PPO

## 2023-11-25 DIAGNOSIS — G459 Transient cerebral ischemic attack, unspecified: Secondary | ICD-10-CM

## 2023-11-25 LAB — CUP PACEART REMOTE DEVICE CHECK
Date Time Interrogation Session: 20250320230205
Implantable Pulse Generator Implant Date: 20210803

## 2023-11-28 DIAGNOSIS — F341 Dysthymic disorder: Secondary | ICD-10-CM | POA: Diagnosis not present

## 2023-11-30 ENCOUNTER — Other Ambulatory Visit: Payer: Self-pay | Admitting: Cardiology

## 2023-12-01 DIAGNOSIS — F341 Dysthymic disorder: Secondary | ICD-10-CM | POA: Diagnosis not present

## 2023-12-02 DIAGNOSIS — E291 Testicular hypofunction: Secondary | ICD-10-CM | POA: Diagnosis not present

## 2023-12-02 DIAGNOSIS — E782 Mixed hyperlipidemia: Secondary | ICD-10-CM | POA: Diagnosis not present

## 2023-12-02 DIAGNOSIS — Z951 Presence of aortocoronary bypass graft: Secondary | ICD-10-CM | POA: Diagnosis not present

## 2023-12-02 DIAGNOSIS — Z23 Encounter for immunization: Secondary | ICD-10-CM | POA: Diagnosis not present

## 2023-12-02 DIAGNOSIS — Z0000 Encounter for general adult medical examination without abnormal findings: Secondary | ICD-10-CM | POA: Diagnosis not present

## 2023-12-05 ENCOUNTER — Encounter: Payer: Self-pay | Admitting: Neurology

## 2023-12-05 DIAGNOSIS — F341 Dysthymic disorder: Secondary | ICD-10-CM | POA: Diagnosis not present

## 2023-12-08 DIAGNOSIS — F341 Dysthymic disorder: Secondary | ICD-10-CM | POA: Diagnosis not present

## 2023-12-12 DIAGNOSIS — F341 Dysthymic disorder: Secondary | ICD-10-CM | POA: Diagnosis not present

## 2023-12-15 DIAGNOSIS — F341 Dysthymic disorder: Secondary | ICD-10-CM | POA: Diagnosis not present

## 2023-12-19 DIAGNOSIS — F341 Dysthymic disorder: Secondary | ICD-10-CM | POA: Diagnosis not present

## 2023-12-22 DIAGNOSIS — F341 Dysthymic disorder: Secondary | ICD-10-CM | POA: Diagnosis not present

## 2023-12-26 DIAGNOSIS — F341 Dysthymic disorder: Secondary | ICD-10-CM | POA: Diagnosis not present

## 2023-12-29 DIAGNOSIS — F341 Dysthymic disorder: Secondary | ICD-10-CM | POA: Diagnosis not present

## 2023-12-30 ENCOUNTER — Ambulatory Visit (INDEPENDENT_AMBULATORY_CARE_PROVIDER_SITE_OTHER): Payer: Medicare PPO

## 2023-12-30 DIAGNOSIS — G459 Transient cerebral ischemic attack, unspecified: Secondary | ICD-10-CM | POA: Diagnosis not present

## 2023-12-30 LAB — CUP PACEART REMOTE DEVICE CHECK
Date Time Interrogation Session: 20250424230448
Implantable Pulse Generator Implant Date: 20210803

## 2024-01-02 DIAGNOSIS — F341 Dysthymic disorder: Secondary | ICD-10-CM | POA: Diagnosis not present

## 2024-01-03 NOTE — Progress Notes (Signed)
 Carelink Summary Report / Loop Recorder

## 2024-01-04 DIAGNOSIS — F341 Dysthymic disorder: Secondary | ICD-10-CM | POA: Diagnosis not present

## 2024-01-09 DIAGNOSIS — F341 Dysthymic disorder: Secondary | ICD-10-CM | POA: Diagnosis not present

## 2024-01-12 DIAGNOSIS — F341 Dysthymic disorder: Secondary | ICD-10-CM | POA: Diagnosis not present

## 2024-01-16 DIAGNOSIS — F341 Dysthymic disorder: Secondary | ICD-10-CM | POA: Diagnosis not present

## 2024-01-19 DIAGNOSIS — F341 Dysthymic disorder: Secondary | ICD-10-CM | POA: Diagnosis not present

## 2024-01-23 DIAGNOSIS — F341 Dysthymic disorder: Secondary | ICD-10-CM | POA: Diagnosis not present

## 2024-01-26 DIAGNOSIS — F341 Dysthymic disorder: Secondary | ICD-10-CM | POA: Diagnosis not present

## 2024-02-02 DIAGNOSIS — F341 Dysthymic disorder: Secondary | ICD-10-CM | POA: Diagnosis not present

## 2024-02-03 ENCOUNTER — Ambulatory Visit (INDEPENDENT_AMBULATORY_CARE_PROVIDER_SITE_OTHER): Payer: Medicare PPO

## 2024-02-03 DIAGNOSIS — G459 Transient cerebral ischemic attack, unspecified: Secondary | ICD-10-CM | POA: Diagnosis not present

## 2024-02-03 LAB — CUP PACEART REMOTE DEVICE CHECK
Date Time Interrogation Session: 20250529232051
Implantable Pulse Generator Implant Date: 20210803

## 2024-02-05 ENCOUNTER — Ambulatory Visit: Payer: Self-pay | Admitting: Cardiology

## 2024-02-06 NOTE — Progress Notes (Signed)
 Carelink Summary Report / Loop Recorder

## 2024-02-07 DIAGNOSIS — F900 Attention-deficit hyperactivity disorder, predominantly inattentive type: Secondary | ICD-10-CM | POA: Diagnosis not present

## 2024-02-23 DIAGNOSIS — H524 Presbyopia: Secondary | ICD-10-CM | POA: Diagnosis not present

## 2024-02-23 DIAGNOSIS — H5051 Esophoria: Secondary | ICD-10-CM | POA: Diagnosis not present

## 2024-02-23 DIAGNOSIS — H2513 Age-related nuclear cataract, bilateral: Secondary | ICD-10-CM | POA: Diagnosis not present

## 2024-02-23 DIAGNOSIS — H53143 Visual discomfort, bilateral: Secondary | ICD-10-CM | POA: Diagnosis not present

## 2024-03-05 ENCOUNTER — Ambulatory Visit

## 2024-03-05 ENCOUNTER — Ambulatory Visit: Payer: Self-pay | Admitting: Cardiology

## 2024-03-05 DIAGNOSIS — G459 Transient cerebral ischemic attack, unspecified: Secondary | ICD-10-CM

## 2024-03-05 LAB — CUP PACEART REMOTE DEVICE CHECK
Date Time Interrogation Session: 20250629231745
Implantable Pulse Generator Implant Date: 20210803

## 2024-03-08 DIAGNOSIS — F431 Post-traumatic stress disorder (PTSD): Secondary | ICD-10-CM | POA: Diagnosis not present

## 2024-03-12 ENCOUNTER — Ambulatory Visit: Payer: Medicare PPO

## 2024-03-12 DIAGNOSIS — F431 Post-traumatic stress disorder (PTSD): Secondary | ICD-10-CM | POA: Diagnosis not present

## 2024-03-15 DIAGNOSIS — F431 Post-traumatic stress disorder (PTSD): Secondary | ICD-10-CM | POA: Diagnosis not present

## 2024-03-19 DIAGNOSIS — F431 Post-traumatic stress disorder (PTSD): Secondary | ICD-10-CM | POA: Diagnosis not present

## 2024-03-21 NOTE — Progress Notes (Signed)
 Carelink Summary Report / Loop Recorder

## 2024-03-22 DIAGNOSIS — F431 Post-traumatic stress disorder (PTSD): Secondary | ICD-10-CM | POA: Diagnosis not present

## 2024-03-26 DIAGNOSIS — F431 Post-traumatic stress disorder (PTSD): Secondary | ICD-10-CM | POA: Diagnosis not present

## 2024-03-29 DIAGNOSIS — F431 Post-traumatic stress disorder (PTSD): Secondary | ICD-10-CM | POA: Diagnosis not present

## 2024-04-02 DIAGNOSIS — F431 Post-traumatic stress disorder (PTSD): Secondary | ICD-10-CM | POA: Diagnosis not present

## 2024-04-05 ENCOUNTER — Ambulatory Visit (INDEPENDENT_AMBULATORY_CARE_PROVIDER_SITE_OTHER)

## 2024-04-05 DIAGNOSIS — F4321 Adjustment disorder with depressed mood: Secondary | ICD-10-CM | POA: Diagnosis not present

## 2024-04-05 DIAGNOSIS — G459 Transient cerebral ischemic attack, unspecified: Secondary | ICD-10-CM

## 2024-04-05 DIAGNOSIS — F431 Post-traumatic stress disorder (PTSD): Secondary | ICD-10-CM | POA: Diagnosis not present

## 2024-04-05 LAB — CUP PACEART REMOTE DEVICE CHECK
Date Time Interrogation Session: 20250730230740
Implantable Pulse Generator Implant Date: 20210803

## 2024-04-06 ENCOUNTER — Ambulatory Visit: Payer: Self-pay | Admitting: Cardiology

## 2024-04-09 DIAGNOSIS — F341 Dysthymic disorder: Secondary | ICD-10-CM | POA: Diagnosis not present

## 2024-04-12 DIAGNOSIS — F341 Dysthymic disorder: Secondary | ICD-10-CM | POA: Diagnosis not present

## 2024-04-13 ENCOUNTER — Ambulatory Visit: Payer: Medicare PPO

## 2024-04-16 DIAGNOSIS — F341 Dysthymic disorder: Secondary | ICD-10-CM | POA: Diagnosis not present

## 2024-04-19 DIAGNOSIS — F341 Dysthymic disorder: Secondary | ICD-10-CM | POA: Diagnosis not present

## 2024-04-23 DIAGNOSIS — F341 Dysthymic disorder: Secondary | ICD-10-CM | POA: Diagnosis not present

## 2024-04-24 DIAGNOSIS — F4321 Adjustment disorder with depressed mood: Secondary | ICD-10-CM | POA: Diagnosis not present

## 2024-04-26 DIAGNOSIS — F341 Dysthymic disorder: Secondary | ICD-10-CM | POA: Diagnosis not present

## 2024-04-27 ENCOUNTER — Institutional Professional Consult (permissible substitution): Payer: Medicare PPO | Admitting: Psychology

## 2024-04-27 ENCOUNTER — Ambulatory Visit: Payer: Self-pay

## 2024-04-30 ENCOUNTER — Other Ambulatory Visit: Payer: Self-pay

## 2024-04-30 DIAGNOSIS — F4321 Adjustment disorder with depressed mood: Secondary | ICD-10-CM | POA: Diagnosis not present

## 2024-04-30 DIAGNOSIS — F341 Dysthymic disorder: Secondary | ICD-10-CM | POA: Diagnosis not present

## 2024-04-30 NOTE — Progress Notes (Deleted)
 NEUROLOGY FOLLOW UP OFFICE NOTE  [name] [id number]  Assessment/Plan:   Memory deficits Essential tremor Right internal carotid artery stenosis Dyslipidemia History of transient ischemic attack     Workup for memory problems: MRI of brain without contrast Check B12, TSH Neuropsychological evaluation For tremor:  propranolol  ER 80mg  (prescribed by cardiology) On Plavix  and statin for secondary stroke prevention Follow up 6 months     Subjective:  Zachary Ponce is a 73year old ambidextrous Caucasian male with CAD s/p coronary artery bypass grafting, left carotid artery disease s/p CEA, HLD, depression and anxiety, essential tremor and history of traumatic brain injury and TIA who follows up for concerns about memory.  He is accompanied by his wife who supplements history.  MRI brain personally reviewed.   UPDATE: Current medications:  Plavix  75mg  daily, atorvastatin  20mg  daily, propranolol  ER 80mg  daily   Memory concerns: Underwent workup.  Labs from November 2024 revealed TSH of 1.30 and B12 328.  Advised to start B12 1000mcg daily.  He underwent neuropsychological evaluation on 08/15/2023 which was consistent with mild neurocognitive disorder likely due to history of ADHD, psychiatric distress and cerebrovascular disease but due to his memory problems, Alzhemier's disease cannot be ruled out.  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     Tremor: Stable   HISTORY:  TIA & Carotid artery stenosis: In late April 2021, he had an episode of dizziness, nausea, vomiting.  He was laying on the bed with his right arm laying over the side and couldn't  move it for about 5 minutes.  EMS was called and blood pressure was reportedly 160 systolic and EKG reportedly normal.  Carotid ultrasound on 01/16/2020 showed 60-79% right ICA stenosis and 1-39% left ICA stenosis.  MRI of brain without contrast on 01/18/2020 personally reviewed showed mild chronic small vessel ischemic changes and small remote left frontal infact but no acute intracranial abnormality.  CTA of head and neck from 03/17/2020 personally reviewed showed moderate M1 MCA stenoses bilaterally and 65-70% proximal right ICA stenosis and moderate stenosis at origin of the dominant right vertebral artery.  2D echocardiogram on 03/20/2020 showed EF 60-65% with no cardiac source of embolus.  Carotid ultrasound on 01/28/2021 showed right ICA 60-79% stenosis, no hemodynamically significant stenosis of left ICA.  He is with implantable loop recorder.  No atrial fibrillation has yet been identified.  Carotid ultrasound in January revealed stable 60-79% stenosis in the right ICA, 1-39% stenosis in the left ICA.  2D echocardiogram in February demonstrated LVEF 60-65% with no regional wall motion abnormalities, trivial MVR, and aortic valve sclerosis/calcification but without stenosis and no atrial level shunt.     In 2019, he underwent left CEA for carotid artery stenosis and coronary bypass grafting.  Following procedure, he did have episodes of atrial fibrillation and was on Eliquis  for a period of time.     Memory deficits: He has had memory problems since his TIA.  It has been worse over the past 2 months.  He is misplacing objects such as the keys.  He once left his shoes in a hotel room.  He is having increased word-finding difficulty.  He started making poor decisions.  One time, his wife was in  another room working.  He was watching his 73 year old grandson.  He needed to get something from [employer], so he left his grandson at home but didn't tell his wife, who didn't know their grandson was alone.  He  reports increased anxiety and irritability.  He is more impatient with his grandson.  He feels like a zombie.  His father had memory problems, possible dementia.  His brother had paranoid schizophrenia.     Essential tremor: He also has essential tremor.  Past medications: primidone  (nightmares), topiramate  (felt wired), lamotrigine   PAST MEDICAL HISTORY: Past Medical History:  Diagnosis Date   ADHD (attention deficit hyperactivity disorder)    dx as an adult   Angina pectoris    Atherosclerosis of both carotid arteries 09/01/2020   Bilateral carotid artery disease 03/07/2018   Left CEA 03/08/18 at time of CABG      CAD (coronary artery disease) 03/03/2018   Cardiac cath  03/03/18 70% distal left main, 99% proximal LAD, 40-50% proximal circumflex, 60% intermediate, 90% ostial right coronary artery, 55% LVEF with some mild distal anterior hypokinesis   Dyslipidemia 07/24/2015   The 10-year ASCVD risk score Verdon DC Jr., et al., 2013) is: 15.7%   Values used to calculate the score:     Age: 37 years     Sex: Male     Is Non-Hispanic African American: No     Diabetic: No     Tobacco smoker: No     Systolic Blood Pressure: 114 mmHg     Is BP treated: No     HDL Cholesterol: 32 mg/dL     Total Cholesterol: 209 mg/dL The 89-bzjm ASCVD risk score Verdon DC Jr., et al., 2013) is   Dysthymia 06/30/2016   Essential tremor    Generalized anxiety disorder 01/22/2016   History of colonic polyps 09/01/2020   History of traumatic head injury 2012   02/18/11, loss of consciousness     Formatting of this note might be different from the original.  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 90 tablet 2   buPROPion  (WELLBUTRIN  XL) 300 MG 24 hr tablet Take 300 mg by mouth daily.     clopidogrel  (PLAVIX ) 75 MG tablet TAKE 1 TABLET BY MOUTH EVERY DAY 90 tablet 3   escitalopram  (LEXAPRO ) 10 MG tablet Take 10 mg by mouth daily.  0   lisdexamfetamine (VYVANSE ) 40 MG capsule Take 40 mg by mouth every morning.     NON FORMULARY Apply 50 mg topically daily. Testosterone  50mg /ml (Patient not taking: Apply 50 mg topically daily. Testosterone  50mg /ml)  0   propranolol  ER (INDERAL  LA) 80 MG 24 hr capsule TAKE 1 CAPSULE BY MOUTH EVERY DAY 90 capsule 3   Current Facility-Administered Medications on File Prior to Visit  Medication Dose Route Frequency Provider Last Rate Last Admin   sodium chloride  flush (NS) 0.9 % injection 10  mL  10 mL Intravenous PRN Skeet, Breland Trouten R, DO   20 mL at 03/20/20 1125    ALLERGIES: No Known Allergies  FAMILY HISTORY: Family History  Problem Relation Age of Onset   Lung cancer Mother    Tremor Mother    Other Father        TBI   Diabetes Brother    Schizophrenia Brother    Tremor Maternal Grandmother    Dementia Paternal Grandmother       Objective:  *** General: No acute distress.  Patient appears ***-groomed.   Head:  Normocephalic/atraumatic Eyes:  Fundi examined but not visualized Neck: supple, no paraspinal tenderness, full range of motion Heart:  Regular rate and rhythm Neurological Exam: alert and oriented.  Speech fluent and not dysarthric, language intact.  CN II-XII intact. Bulk and tone normal, muscle strength 5/5 throughout.  Sensation to light touch intact.  Deep tendon reflexes 2+ throughout, toes downgoing.  Finger to nose testing intact.   Gait normal, Romberg negative.   Juliene Skeet, DO  CC: ***

## 2024-04-30 NOTE — Progress Notes (Deleted)
 NEUROLOGY FOLLOW UP OFFICE NOTE  Zachary Ponce [id number]  Assessment/Plan:   Memory deficits Essential tremor Right internal carotid artery stenosis Dyslipidemia History of transient ischemic attack    Workup for memory problems: MRI of brain without contrast Check B12, TSH Neuropsychological evaluation For tremor:  propranolol  ER 80mg  (prescribed by cardiology) On Plavix  and statin for secondary stroke prevention Follow up 6 months    Subjective:  Zachary Ponce is a 73year old ambidextrous Caucasian male with CAD s/p coronary artery bypass grafting, left carotid artery disease s/p CEA, HLD, depression and anxiety, essential tremor and history of traumatic brain injury and TIA who follows up for concerns about memory.  He is accompanied by his wife who supplements history.  MRI brain personally reviewed.   UPDATE: Current medications:  Plavix  75mg  daily, atorvastatin  20mg  daily, propranolol  ER 80mg  daily  Memory concerns: Underwent workup.  Labs from November 2024 revealed TSH of 1.30 and B12 328.  Advised to start B12 1000mcg daily.  MRI of brain without contrast on 09/02/2023 revealed mild generalized atrophy ***  Tremor: Stable  HISTORY:  TIA & Carotid artery stenosis: In late April 2021, he had an episode of dizziness, nausea, vomiting.  He was laying on the bed with his right arm laying over the side and couldn't move it for about 5 minutes.  EMS was called and blood pressure was reportedly 160 systolic and EKG reportedly normal.  Carotid ultrasound on 01/16/2020 showed 60-79% right ICA stenosis and 1-39% left ICA stenosis.  MRI of brain without contrast on 01/18/2020 personally reviewed showed mild chronic small vessel ischemic changes and small remote left frontal infact but no acute intracranial abnormality.  CTA of head and neck from 03/17/2020 personally reviewed showed moderate M1 MCA stenoses bilaterally and 65-70% proximal right ICA stenosis and moderate  stenosis at origin of the dominant right vertebral artery.  2D echocardiogram on 03/20/2020 showed EF 60-65% with no cardiac source of embolus.  Carotid ultrasound on 01/28/2021 showed right ICA 60-79% stenosis, no hemodynamically significant stenosis of left ICA.  He is with implantable loop recorder.  No atrial fibrillation has yet been identified.  Carotid ultrasound in January revealed stable 60-79% stenosis in the right ICA, 1-39% stenosis in the left ICA.  2D echocardiogram in February demonstrated LVEF 60-65% with no regional wall motion abnormalities, trivial MVR, and aortic valve sclerosis/calcification but without stenosis and no atrial level shunt.     In 2019, he underwent left CEA for carotid artery stenosis and coronary bypass grafting.  Following procedure, he did have episodes of atrial fibrillation and was on Eliquis  for a period of time.    Memory deficits: He has had memory problems since his TIA.  It has been worse over the past 2 months.  He is misplacing objects such as the keys.  He once left his shoes in a hotel room.  He is having increased word-finding difficulty.  He started making poor decisions.  One time, his wife was in another room working.  He was watching his 58 year old grandson.  He needed to get something from [employer], so he left his grandson at home but didn't tell his wife, who didn't know their grandson was alone.  He reports increased anxiety and irritability.  He is more impatient with his grandson.  He feels like a zombie.  His father had memory problems, possible dementia.  His brother had paranoid schizophrenia.    Essential tremor: He also has essential tremor.  Past medications: primidone  (nightmares), topiramate  (felt wired), lamotrigine   PAST MEDICAL HISTORY: Past Medical History:  Diagnosis Date   ADHD (attention deficit hyperactivity disorder)    dx as an adult   Angina pectoris    Atherosclerosis of both carotid arteries 09/01/2020   Bilateral  carotid artery disease 03/07/2018   Left CEA 03/08/18 at time of CABG      CAD (coronary artery disease) 03/03/2018   Cardiac cath  03/03/18 70% distal left main, 99% proximal LAD, 40-50% proximal circumflex, 60% intermediate, 90% ostial right coronary artery, 55% LVEF with some mild distal anterior hypokinesis   Dyslipidemia 07/24/2015   The 10-year ASCVD risk score Verdon DC Jr., et al., 2013) is: 15.7%   Values used to calculate the score:     Age: 67 years     Sex: Male     Is Non-Hispanic African American: No     Diabetic: No     Tobacco smoker: No     Systolic Blood Pressure: 114 mmHg     Is BP treated: No     HDL Cholesterol: 32 mg/dL     Total Cholesterol: 209 mg/dL The 89-bzjm ASCVD risk score Verdon DC Jr., et al., 2013) is   Dysthymia 06/30/2016   Essential tremor    Generalized anxiety disorder 01/22/2016   History of colonic polyps 09/01/2020   History of traumatic head injury 2012   02/18/11, loss of consciousness     Formatting of this note might be different from the original.  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 90 tablet 2   buPROPion  (WELLBUTRIN  XL) 300 MG 24 hr tablet Take 300 mg by mouth daily.     clopidogrel  (PLAVIX ) 75 MG tablet TAKE 1 TABLET BY MOUTH EVERY DAY 90 tablet 3   escitalopram  (LEXAPRO ) 10 MG tablet Take 10 mg by mouth daily.  0   lisdexamfetamine  (VYVANSE ) 40 MG capsule Take 40 mg by mouth every morning.     NON FORMULARY Apply 50 mg topically daily. Testosterone  50mg /ml (Patient not taking: Apply 50 mg topically daily. Testosterone  50mg /ml)  0   propranolol  ER (INDERAL  LA) 80 MG 24 hr capsule TAKE 1 CAPSULE BY MOUTH EVERY DAY 90 capsule 3   Current Facility-Administered Medications on File Prior to Visit  Medication Dose Route Frequency Provider Last Rate Last Admin   sodium chloride  flush (NS) 0.9 % injection 10 mL  10 mL Intravenous PRN Chimamanda Siegfried R, DO   20 mL at 03/20/20 1125    ALLERGIES: No Known Allergies  FAMILY HISTORY: Family History  Problem Relation Age of Onset   Lung cancer Mother    Tremor Mother    Other Father        TBI   Diabetes Brother    Schizophrenia Brother    Tremor Maternal Grandmother    Dementia Paternal Grandmother       Objective:  ***  General: No acute distress.  Patient appears well-groomed.   Head:  Normocephalic/atraumatic Eyes:  Fundi examined but not visualized Neck: supple, no paraspinal tenderness, full range of motion Heart:  Regular rate and rhythm Neurological Exam: alert and oriented to person, place, and time.  Speech fluent and not dysarthric, language intact.      08/02/2023   11:00 AM  [employer] Mental Exam  Weekday Correct 1  Current year 1  What state are we in? 1  Amount spent 1  Amount left 2  # of Animals 3  5 objects recall 2  Number series 2  Hour markers 2  Time correct 2  Placed X in triangle correctly 1  Largest Figure 1  Name of male 0  Date back to work 2  Type of work 2  State she lived in 2  Total score 25   CN II-XII intact. Bulk and tone normal, muscle strength 5/5 throughout.  Sensation to light touch intact.  Deep tendon reflexes 2+ throughout.  Finger to nose testing demonstrates bilateral intention tremor in hands.  Gait normal, Romberg negative.    Juliene Dunnings, DO  CC: Elsie Lesches, MD

## 2024-05-01 ENCOUNTER — Telehealth: Payer: Self-pay

## 2024-05-01 DIAGNOSIS — R7989 Other specified abnormal findings of blood chemistry: Secondary | ICD-10-CM

## 2024-05-01 NOTE — Telephone Encounter (Signed)
 Patient is requesting refill of:  Testosterone  cream   Pharmacy:  [facility]  Please advise.

## 2024-05-01 NOTE — Telephone Encounter (Signed)
 Called patient and left detailed vm making patient aware that none of the provider here prescribe him Testosterone  cream and for him to reach to his prescribing provider.

## 2024-05-01 NOTE — Telephone Encounter (Signed)
 Called patient to make him aware detailed message RX was routed to MD, Dahlstedt for review.

## 2024-05-02 ENCOUNTER — Other Ambulatory Visit: Payer: Self-pay | Admitting: Urology

## 2024-05-02 MED ORDER — AMBULATORY NON FORMULARY MEDICATION: 5 refills | Status: AC

## 2024-05-02 NOTE — Addendum Note (Signed)
 Addended by: GRETTA MASTERS R on: 05/02/2024 01:43 PM   Modules accepted: Orders

## 2024-05-02 NOTE — Telephone Encounter (Signed)
 Rx printed and awaiting MD, Signature Please call in compounded testosterone  to [facility]: Testosterone  micronized powder 5% cream  Apply 1 mL to skin daily   30 mL per month 5 refills

## 2024-05-03 DIAGNOSIS — F341 Dysthymic disorder: Secondary | ICD-10-CM | POA: Diagnosis not present

## 2024-05-03 NOTE — Telephone Encounter (Signed)
 Pt is made aware via detailed voiced message Rx faxed to [facility].

## 2024-05-04 ENCOUNTER — Encounter: Payer: Medicare PPO | Admitting: Psychology

## 2024-05-07 ENCOUNTER — Ambulatory Visit

## 2024-05-07 DIAGNOSIS — G459 Transient cerebral ischemic attack, unspecified: Secondary | ICD-10-CM

## 2024-05-08 ENCOUNTER — Ambulatory Visit: Payer: Medicare PPO | Admitting: Neurology

## 2024-05-09 ENCOUNTER — Ambulatory Visit: Admitting: Neurology

## 2024-05-09 ENCOUNTER — Encounter: Payer: Self-pay | Admitting: Neurology

## 2024-05-09 LAB — CUP PACEART REMOTE DEVICE CHECK
Date Time Interrogation Session: 20250831230047
Implantable Pulse Generator Implant Date: 20210803

## 2024-05-10 DIAGNOSIS — F341 Dysthymic disorder: Secondary | ICD-10-CM | POA: Diagnosis not present

## 2024-05-11 ENCOUNTER — Ambulatory Visit: Payer: Self-pay | Admitting: Cardiology

## 2024-05-15 NOTE — Progress Notes (Signed)
 Remote Loop Recorder Transmission

## 2024-05-17 DIAGNOSIS — H2512 Age-related nuclear cataract, left eye: Secondary | ICD-10-CM | POA: Diagnosis not present

## 2024-05-17 DIAGNOSIS — H18413 Arcus senilis, bilateral: Secondary | ICD-10-CM | POA: Diagnosis not present

## 2024-05-17 DIAGNOSIS — H25043 Posterior subcapsular polar age-related cataract, bilateral: Secondary | ICD-10-CM | POA: Diagnosis not present

## 2024-05-17 DIAGNOSIS — H25013 Cortical age-related cataract, bilateral: Secondary | ICD-10-CM | POA: Diagnosis not present

## 2024-05-17 DIAGNOSIS — H2513 Age-related nuclear cataract, bilateral: Secondary | ICD-10-CM | POA: Diagnosis not present

## 2024-05-18 ENCOUNTER — Ambulatory Visit: Payer: Medicare PPO

## 2024-05-28 DIAGNOSIS — F341 Dysthymic disorder: Secondary | ICD-10-CM | POA: Diagnosis not present

## 2024-06-01 NOTE — Progress Notes (Signed)
 NEUROLOGY FOLLOW UP OFFICE NOTE  ALESANDRO STUEVE [id number]  Assessment/Plan:   Mild neurocognitive disorder Essential tremor Right internal carotid artery stenosis History of transient ischemic attack     Repeat neuropsychological evaluation to evaluate for changes in performance that may provide more clarity for diagnosis. For tremor:  propranolol  ER 80mg  (prescribed by cardiology) On Plavix  and statin for secondary stroke prevention Follow up after testing.     Subjective:   Harm Jou is a 73 year old ambidextrous Caucasian male with CAD s/p coronary artery bypass grafting, left carotid artery disease s/p CEA, HLD, depression and anxiety, essential tremor and history of traumatic brain injury and TIA who follows up for concerns about memory.  He is accompanied by his wife who supplements history.  MRI brain personally reviewed.   UPDATE: Current medications:  Plavix  75mg  daily, atorvastatin  20mg  daily, propranolol  ER 80mg  daily   Memory concerns: Underwent workup.  Labs from November 2024 revealed TSH of 1.30 and B12 328.  Advised to start B12 1000mcg daily.  He underwent neuropsychological evaluation on 08/15/2023 which was consistent with mild neurocognitive disorder likely due to history of ADHD, psychiatric distress and cerebrovascular disease but due to his memory problems, Alzhemier's disease cannot be ruled out.  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  Memory problems are stable.  Has routine annual carotid ultrasounds.  Last US  on 09/21/2023 reveled 40-59% right ICA stenosis and 1-39% left ICA stenosis.   Tremor: Overall stable.  Usually  occurs with action such as writing or using utensils.  Uses right hand to hold a drink since the left hand is worse.     HISTORY:  TIA & Carotid artery stenosis: In late April 2021, he had an episode of dizziness, nausea, vomiting.  He was laying on the bed with his right arm laying over the side and couldn't move it for about 5 minutes.  EMS was called and blood pressure was reportedly 160 systolic and EKG reportedly normal.  Carotid ultrasound on 01/16/2020 showed 60-79% right ICA stenosis and 1-39% left ICA stenosis.  MRI of brain without contrast on 01/18/2020 personally reviewed showed mild chronic small vessel ischemic changes and small remote left frontal infact but no acute intracranial abnormality.  CTA of head and neck from 03/17/2020 personally reviewed showed moderate M1 MCA stenoses bilaterally and 65-70% proximal right ICA stenosis and moderate stenosis at origin of the dominant right vertebral artery.  2D echocardiogram on 03/20/2020 showed EF 60-65% with no cardiac source of embolus.  Carotid ultrasound on 01/28/2021 showed right ICA 60-79% stenosis, no hemodynamically significant stenosis of left ICA.  He is with implantable loop recorder.  No atrial fibrillation has yet been identified.  Carotid ultrasound in January revealed stable 60-79% stenosis in the right ICA, 1-39% stenosis in the left ICA.  2D echocardiogram in February demonstrated LVEF 60-65% with no regional wall motion abnormalities, trivial MVR, and aortic valve sclerosis/calcification but without stenosis and no atrial level shunt.     In 2019, he underwent left CEA for carotid artery stenosis and coronary bypass grafting.  Following procedure, he did have episodes of atrial fibrillation and was on Eliquis  for a period of time.     Memory deficits: He  has had memory problems since his TIA.  It has been worse over the past 2 months.  He is misplacing objects such as the keys.  He once left his shoes in a hotel room.  He is having  increased word-finding difficulty.  He started making poor decisions.  One time, his wife was in another room working.  He was watching his 16 year old grandson.  He needed to get something from [employer], so he left his grandson at home but didn't tell his wife, who didn't know their grandson was alone.  He reports increased anxiety and irritability.  He is more impatient with his grandson.  He feels like a zombie.  His father had memory problems, possible dementia.  His brother had paranoid schizophrenia.     Essential tremor: He also has essential tremor.  Past medications: primidone  (nightmares), topiramate  (felt wired), lamotrigine   PAST MEDICAL HISTORY: Past Medical History:  Diagnosis Date   ADHD (attention deficit hyperactivity disorder)    dx as an adult   Angina pectoris    Atherosclerosis of both carotid arteries 09/01/2020   Bilateral carotid artery disease 03/07/2018   Left CEA 03/08/18 at time of CABG      CAD (coronary artery disease) 03/03/2018   Cardiac cath  03/03/18 70% distal left main, 99% proximal LAD, 40-50% proximal circumflex, 60% intermediate, 90% ostial right coronary artery, 55% LVEF with some mild distal anterior hypokinesis   Dyslipidemia 07/24/2015   The 10-year ASCVD risk score Verdon DC Jr., et al., 2013) is: 15.7%   Values used to calculate the score:     Age: 18 years     Sex: Male     Is Non-Hispanic African American: No     Diabetic: No     Tobacco smoker: No     Systolic Blood Pressure: 114 mmHg     Is BP treated: No     HDL Cholesterol: 32 mg/dL     Total Cholesterol: 209 mg/dL The 89-bzjm ASCVD risk score Verdon DC Jr., et al., 2013) is   Dysthymia 06/30/2016   Essential tremor    Generalized anxiety disorder 01/22/2016   History of colonic polyps 09/01/2020   History of traumatic head injury 2012   02/18/11, loss of consciousness     Formatting of this note might be different from the original.  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 90 tablet 2   buPROPion  (WELLBUTRIN  XL) 300 MG 24 hr tablet Take 300 mg by mouth daily.     clopidogrel  (PLAVIX ) 75 MG tablet TAKE 1 TABLET BY MOUTH EVERY DAY 90 tablet 3   escitalopram  (LEXAPRO ) 10 MG tablet Take 10 mg by mouth daily.  0   lisdexamfetamine (VYVANSE ) 40 MG capsule Take 40  mg by mouth every morning.     NON FORMULARY Apply 50 mg topically daily. Testosterone  50mg /ml (Patient not taking: Apply 50 mg topically daily. Testosterone  50mg /ml)  0   propranolol  ER (INDERAL  LA) 80 MG 24 hr capsule TAKE 1 CAPSULE BY MOUTH EVERY DAY 90 capsule 3   Current Facility-Administered Medications on File Prior to Visit  Medication Dose Route Frequency Provider Last Rate Last Admin   sodium chloride  flush (NS) 0.9 % injection 10 mL  10 mL Intravenous PRN Jaeden Messer R, DO   20 mL at 03/20/20 1125    ALLERGIES: No Known Allergies  FAMILY HISTORY: Family History  Problem Relation Age of Onset   Lung cancer Mother    Tremor Mother    Other Father        TBI   Diabetes Brother    Schizophrenia Brother    Tremor Maternal Grandmother    Dementia Paternal Grandmother       Objective:  Blood pressure 127/62, pulse (!) 58, height 5' 10 (1.778 m),  weight 162 lb (73.5 kg), SpO2 99%. General: No acute distress.  Patient appears well-groomed.   Head:  Normocephalic/atraumatic Eyes:  Fundi examined but not visualized Neck: supple, no paraspinal tenderness, full range of motion Heart:  Regular rate and rhythm Neurological Exam: alert and oriented.  Speech fluent and not dysarthric, language intact.      08/02/2023   11:00 AM 06/04/2024    3:00 PM  [employer] Mental Exam  Weekday Correct 1 1  Current year 1 1  What state are we in? 1 1  Amount spent 1 1  Amount left 2 2  # of Animals 3 3  5  objects recall 2 4  Number series 2 0  Hour markers 2 0  Time correct 2 0  Placed X in triangle correctly 1 1  Largest Figure 1 1  Name of male 0 2  Date back to work 2 2  Type of work 2 2  State she lived in 2 2  Total score 25 23   CN II-XII intact. Bulk and tone normal, muscle strength 5/5 throughout.  Sensation to light touch intact.  Deep tendon reflexes 2+ throughout, toes downgoing.  Finger to nose testing demonstrates very mild intention tremor bilaterally.  Gait normal, Romberg negative.   Juliene Dunnings, DO  CC: Elsie Lesches, MD

## 2024-06-04 ENCOUNTER — Ambulatory Visit: Admitting: Neurology

## 2024-06-04 ENCOUNTER — Encounter: Payer: Self-pay | Admitting: Neurology

## 2024-06-04 VITALS — BP 127/62 | HR 58 | Ht 70.0 in | Wt 162.0 lb

## 2024-06-04 DIAGNOSIS — F341 Dysthymic disorder: Secondary | ICD-10-CM | POA: Diagnosis not present

## 2024-06-04 DIAGNOSIS — G250 Essential tremor: Secondary | ICD-10-CM | POA: Diagnosis not present

## 2024-06-04 DIAGNOSIS — F0670 Mild neurocognitive disorder due to known physiological condition without behavioral disturbance: Secondary | ICD-10-CM | POA: Diagnosis not present

## 2024-06-04 NOTE — Patient Instructions (Signed)
 Repeat neurocognitive testing with Dr. Richie Follow up after repeat testing with Dr. Richie

## 2024-06-05 NOTE — Progress Notes (Signed)
 Remote Loop Recorder Transmission

## 2024-06-06 NOTE — Progress Notes (Signed)
 Remote Loop Recorder Transmission

## 2024-06-07 ENCOUNTER — Ambulatory Visit

## 2024-06-07 DIAGNOSIS — G459 Transient cerebral ischemic attack, unspecified: Secondary | ICD-10-CM

## 2024-06-07 DIAGNOSIS — F341 Dysthymic disorder: Secondary | ICD-10-CM | POA: Diagnosis not present

## 2024-06-07 LAB — CUP PACEART REMOTE DEVICE CHECK
Date Time Interrogation Session: 20251001230443
Implantable Pulse Generator Implant Date: 20210803

## 2024-06-11 ENCOUNTER — Ambulatory Visit: Payer: Self-pay | Admitting: Cardiology

## 2024-06-11 NOTE — Progress Notes (Signed)
 Remote Loop Recorder Transmission

## 2024-06-14 DIAGNOSIS — F341 Dysthymic disorder: Secondary | ICD-10-CM | POA: Diagnosis not present

## 2024-06-18 DIAGNOSIS — F341 Dysthymic disorder: Secondary | ICD-10-CM | POA: Diagnosis not present

## 2024-06-21 DIAGNOSIS — F341 Dysthymic disorder: Secondary | ICD-10-CM | POA: Diagnosis not present

## 2024-06-21 DIAGNOSIS — F4321 Adjustment disorder with depressed mood: Secondary | ICD-10-CM | POA: Diagnosis not present

## 2024-06-22 ENCOUNTER — Ambulatory Visit: Payer: Medicare PPO

## 2024-06-25 DIAGNOSIS — F341 Dysthymic disorder: Secondary | ICD-10-CM | POA: Diagnosis not present

## 2024-06-28 DIAGNOSIS — F341 Dysthymic disorder: Secondary | ICD-10-CM | POA: Diagnosis not present

## 2024-06-28 DIAGNOSIS — F4321 Adjustment disorder with depressed mood: Secondary | ICD-10-CM | POA: Diagnosis not present

## 2024-07-02 DIAGNOSIS — F341 Dysthymic disorder: Secondary | ICD-10-CM | POA: Diagnosis not present

## 2024-07-04 DIAGNOSIS — B349 Viral infection, unspecified: Secondary | ICD-10-CM | POA: Diagnosis not present

## 2024-07-04 DIAGNOSIS — Z23 Encounter for immunization: Secondary | ICD-10-CM | POA: Diagnosis not present

## 2024-07-05 DIAGNOSIS — F341 Dysthymic disorder: Secondary | ICD-10-CM | POA: Diagnosis not present

## 2024-07-06 ENCOUNTER — Ambulatory Visit: Admitting: Neurology

## 2024-07-09 ENCOUNTER — Ambulatory Visit (INDEPENDENT_AMBULATORY_CARE_PROVIDER_SITE_OTHER)

## 2024-07-09 DIAGNOSIS — G459 Transient cerebral ischemic attack, unspecified: Secondary | ICD-10-CM | POA: Diagnosis not present

## 2024-07-09 LAB — CUP PACEART REMOTE DEVICE CHECK
Date Time Interrogation Session: 20251102230849
Implantable Pulse Generator Implant Date: 20210803

## 2024-07-10 ENCOUNTER — Ambulatory Visit: Payer: Self-pay | Admitting: Cardiology

## 2024-07-12 NOTE — Progress Notes (Signed)
 Remote Loop Recorder Transmission

## 2024-07-27 ENCOUNTER — Ambulatory Visit: Payer: Medicare PPO

## 2024-08-06 DIAGNOSIS — H52202 Unspecified astigmatism, left eye: Secondary | ICD-10-CM | POA: Diagnosis not present

## 2024-08-06 DIAGNOSIS — H5371 Glare sensitivity: Secondary | ICD-10-CM | POA: Diagnosis not present

## 2024-08-06 DIAGNOSIS — H2512 Age-related nuclear cataract, left eye: Secondary | ICD-10-CM | POA: Diagnosis not present

## 2024-08-07 DIAGNOSIS — H2511 Age-related nuclear cataract, right eye: Secondary | ICD-10-CM | POA: Diagnosis not present

## 2024-08-09 ENCOUNTER — Ambulatory Visit

## 2024-08-11 ENCOUNTER — Ambulatory Visit

## 2024-08-11 DIAGNOSIS — G459 Transient cerebral ischemic attack, unspecified: Secondary | ICD-10-CM | POA: Diagnosis not present

## 2024-08-13 LAB — CUP PACEART REMOTE DEVICE CHECK
Date Time Interrogation Session: 20251205231040
Implantable Pulse Generator Implant Date: 20210803

## 2024-08-15 ENCOUNTER — Ambulatory Visit: Payer: Self-pay | Admitting: Cardiology

## 2024-08-15 NOTE — Progress Notes (Signed)
 Remote Loop Recorder Transmission

## 2024-08-29 ENCOUNTER — Other Ambulatory Visit: Payer: Self-pay | Admitting: Cardiology

## 2024-08-31 ENCOUNTER — Ambulatory Visit: Payer: Medicare PPO

## 2024-09-11 ENCOUNTER — Ambulatory Visit

## 2024-09-11 DIAGNOSIS — G459 Transient cerebral ischemic attack, unspecified: Secondary | ICD-10-CM | POA: Diagnosis not present

## 2024-09-11 LAB — CUP PACEART REMOTE DEVICE CHECK
Date Time Interrogation Session: 20260105230521
Implantable Pulse Generator Implant Date: 20210803

## 2024-09-12 ENCOUNTER — Ambulatory Visit: Payer: Self-pay | Admitting: Cardiology

## 2024-09-13 NOTE — Progress Notes (Signed)
 Remote Loop Recorder Transmission

## 2024-09-14 ENCOUNTER — Ambulatory Visit: Payer: Self-pay

## 2024-09-14 ENCOUNTER — Encounter: Payer: Self-pay | Admitting: Psychology

## 2024-09-14 ENCOUNTER — Ambulatory Visit: Admitting: Psychology

## 2024-09-14 DIAGNOSIS — G3184 Mild cognitive impairment, so stated: Secondary | ICD-10-CM

## 2024-09-14 DIAGNOSIS — F0670 Mild neurocognitive disorder due to known physiological condition without behavioral disturbance: Secondary | ICD-10-CM

## 2024-09-14 DIAGNOSIS — I6523 Occlusion and stenosis of bilateral carotid arteries: Secondary | ICD-10-CM

## 2024-09-14 NOTE — Progress Notes (Signed)
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 Memory        Immediate Recall 28/51 (31) 42/51 (50) 50 Average    Delayed Recall 5/17 (19) 9/17 (33) 5 Well Below Average    Retention Percentage 36 (<19) 56 (32) 4 Well Below Average    Recognition Hits 5/10 (20) 7/10 (36) 8 Well Below Average        Attention/Executive Function:      Oral Trail Making Test (OTMT): Raw Score (Z-Score) Raw Score (Z-Score) Percentile     Part A 12 secs.,  0 errors (-2.10) 13 secs.,  0 errors (-2.54) 1 Exceptionally Low    Part B 25 secs.,  0 errors (0.87) 29 secs.,  0 errors (0.65) 74 Average        Symbol Digit Modalities Test (SDMT): Raw Score (Z-Score) Raw Score (Z-Score) Percentile     Oral 39 (-1.02) 32 (-1.54) 6 Well Below Average        NAB Attention Module, Form 1: T Score T Score Percentile     Digits Forward 44 40 16 Below Average    Digits Backwards 40 40 16 Below Average         Scaled Score Scaled Score Percentile   WAIS-IV Similarities: 9 --- --- ---        D-KEFS Color-Word Interference Test: Raw Score (Scaled Score) Raw Score (Scaled Score) Percentile     Color Naming 34 secs. (10) 38 secs. (8) 25 Average    Word Reading 27 secs. (9) 28  secs. (9) 37 Average    Inhibition 87 secs. (8) 87 secs. (8) 25 Average      Total Errors 8 errors (5) 0 errors (13) 84 Above Average    Inhibition/Switching Discontinued 122 secs. (4) 2 Well Below Average      Total Errors --- 4 errors (9) 37 Average        D-KEFS Verbal Fluency Test: Raw Score (Scaled Score) Raw Score (Scaled Score) Percentile     Letter Total Correct 46 (13) 67 (19) >99 Exceptionally High    Category Total Correct 35 (11) 37 (12) 75 Above Average    Category Switching Total Correct 10 (8) 10 (8) 25 Average    Category Switching Accuracy 7 (6) 8 (7) 16 Below Average      Total Set Loss Errors 5 (6) 3 (9) 37 Average      Total Repetition Errors 2 (11) 9 (4) 2 Well Below Average        NAB Executive Functions Module, Form 1: T Score T Score Percentile     Judgment 58 --- --- ---  Language:      Verbal Fluency Test: Raw Score (T Score) Raw Score (T Score) Percentile     Phonemic Fluency (FAS) 46 (54) 67 (72) 99 Exceptionally High    Animal Fluency 23 (58) 18 (45) 31 Average         NAB Language Module, Form 1: T Score T Score Percentile     Naming 30/31 (56) 31/31 (57) 75 Above Average        Visuospatial/Visuoconstruction:       Raw Score Raw Score Percentile   Clock Drawing: 10/10 10/10 --- Within Normal Limits        NAB Spatial Module, Form 1: T Score T Score Percentile     Figure Drawing Copy 50 60 84 Above Average         Scaled Score Scaled Score Percentile   WAIS-IV Visual Puzzles: 9 12 75 Above Average        Mood and Personality:       Raw Score Raw Score Percentile   Beck Depression Inventory - II: 15 18 --- Mild  PROMIS Anxiety Questionnaire: 28 25 --- Moderate        Additional Questionnaires:       Raw Score Raw Score Percentile   PROMIS Sleep Disturbance Questionnaire: 24 20 --- None to Slight   Informed Consent and Coding/Compliance:   The current evaluation represents a clinical evaluation for the purposes previously outlined by  the referral source and is in no way reflective of a forensic evaluation.   Mr. Liszewski was provided with a verbal description of the nature and purpose of the present neuropsychological evaluation. Also reviewed were the foreseeable risks and/or discomforts and benefits of the procedure, limits of confidentiality, and mandatory reporting requirements of this provider. The patient was given the opportunity to ask questions and receive answers about the evaluation. Oral consent to participate was provided by the patient.   This evaluation was conducted by Arthea KYM Maryland, Ph.D., ABPP-CN, board certified clinical neuropsychologist. Mr. Zoll completed a clinical interview with Dr. Maryland, billed as one unit [phone], and 126 minutes of cognitive testing and scoring, billed as one unit [phone] and three additional units [id number]. As a separate and discrete service, one unit [id number] and two units 96133 (172 minutes) were billed for Dr. Loralee time spent in interpretation and report writing.      "

## 2024-09-20 ENCOUNTER — Encounter: Admitting: Psychology

## 2024-09-21 ENCOUNTER — Ambulatory Visit: Admitting: Psychology

## 2024-09-21 DIAGNOSIS — F0670 Mild neurocognitive disorder due to known physiological condition without behavioral disturbance: Secondary | ICD-10-CM | POA: Diagnosis not present

## 2024-09-21 NOTE — Progress Notes (Signed)
"  ° °  Neuropsychology Feedback Session Jolynn DEL. [facility] [place] Department of Neurology  Reason for Referral:   Zachary Ponce is a 74 y.o. right-handed Caucasian male referred by Juliene Dunnings, D.O., to characterize his current cognitive functioning and assist with diagnostic clarity and treatment planning in the context of subjective cognitive decline.   Feedback:   Zachary Ponce completed a comprehensive neuropsychological evaluation on 09/14/2024. Please refer to that encounter for the full report and recommendations. Briefly, results suggested performance variability surrounding processing speed, executive functioning, and delayed retrieval aspects of memory. No cognitive domain exhibited consistent cognitive impairment. A relative weakness was exhibited across attention/concentration. Performances were appropriate relative to age-matched peers across receptive and expressive language, visuospatial abilities, and encoding (i.e., learning) aspects of memory. Relative to his previous evaluation in December 2024, fairly noteworthy improvements were exhibited across phonemic fluency and the majority of memory testing, likely reflecting Zachary Ponce being more focused and less distractible relative to his previous testing session. Mild improvement could also be argued across response inhibition and visuospatial abilities. A decline could be argued across processing speed; however, this would be fairly subtle. No other domain saw appreciable cognitive decline. The etiology for ongoing cognitive dysfunction is likely multifactorial in nature. 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 Psychiatrically, Zachary Ponce reported acute symptoms of  mild depression and moderate anxiety across current mood-related questionnaires. Current testing patterns certainly align with the combination of medical/neurological factors and significant psychiatric distress, superimposed on his longstanding history of ADHD.  Zachary Ponce was unaccompanied during the current feedback session. Content of the current session focused on the results of his neuropsychological evaluation. Zachary Ponce was given the opportunity to ask questions and his questions were answered. He was encouraged to reach out should additional questions arise. A copy of his report was provided at the conclusion of the visit.      One unit 96132 (31 minutes) was billed for Dr. Loralee time spent preparing for, conducting, and documenting the current feedback session with Zachary Ponce. "

## 2024-10-12 ENCOUNTER — Ambulatory Visit

## 2024-10-16 ENCOUNTER — Ambulatory Visit: Payer: Medicare PPO | Admitting: Urology

## 2024-10-30 ENCOUNTER — Ambulatory Visit: Admitting: Neurology

## 2024-11-12 ENCOUNTER — Ambulatory Visit

## 2024-12-12 ENCOUNTER — Ambulatory Visit: Admitting: Urology
